# Patient Record
Sex: Female | Born: 1935 | Race: White | Hispanic: No | Marital: Married | Smoking: Former smoker
Health system: Southern US, Community
[De-identification: ages and names within clinical notes are randomized; demographics above are authoritative.]

## PROBLEM LIST (undated history)

## (undated) DIAGNOSIS — G629 Polyneuropathy, unspecified: Secondary | ICD-10-CM

## (undated) DIAGNOSIS — D649 Anemia, unspecified: Secondary | ICD-10-CM

## (undated) DIAGNOSIS — E119 Type 2 diabetes mellitus without complications: Secondary | ICD-10-CM

## (undated) DIAGNOSIS — C801 Malignant (primary) neoplasm, unspecified: Secondary | ICD-10-CM

## (undated) DIAGNOSIS — I10 Essential (primary) hypertension: Secondary | ICD-10-CM

## (undated) DIAGNOSIS — N189 Chronic kidney disease, unspecified: Secondary | ICD-10-CM

## (undated) DIAGNOSIS — E079 Disorder of thyroid, unspecified: Secondary | ICD-10-CM

## (undated) DIAGNOSIS — C50919 Malignant neoplasm of unspecified site of unspecified female breast: Secondary | ICD-10-CM

## (undated) DIAGNOSIS — M1990 Unspecified osteoarthritis, unspecified site: Secondary | ICD-10-CM

## (undated) DIAGNOSIS — I739 Peripheral vascular disease, unspecified: Secondary | ICD-10-CM

## (undated) HISTORY — PX: BREAST LUMPECTOMY: SHX2

## (undated) HISTORY — PX: URETERAL STENT PLACEMENT: SHX822

## (undated) HISTORY — PX: TOTAL HIP ARTHROPLASTY: SHX124

## (undated) HISTORY — PX: CHOLECYSTECTOMY: SHX55

## (undated) HISTORY — DX: Chronic kidney disease, unspecified: N18.9

## (undated) HISTORY — DX: Essential (primary) hypertension: I10

## (undated) HISTORY — DX: Malignant (primary) neoplasm, unspecified: C80.1

## (undated) HISTORY — PX: REPLACEMENT TOTAL KNEE: SUR1224

## (undated) HISTORY — DX: Anemia, unspecified: D64.9

## (undated) HISTORY — DX: Unspecified osteoarthritis, unspecified site: M19.90

## (undated) HISTORY — PX: EYE SURGERY: SHX253

## (undated) HISTORY — DX: Peripheral vascular disease, unspecified: I73.9

## (undated) HISTORY — DX: Disorder of thyroid, unspecified: E07.9

## (undated) HISTORY — DX: Polyneuropathy, unspecified: G62.9

---

## 2010-03-15 ENCOUNTER — Encounter: Payer: Self-pay | Admitting: Family Medicine

## 2010-03-15 ENCOUNTER — Ambulatory Visit (INDEPENDENT_AMBULATORY_CARE_PROVIDER_SITE_OTHER): Payer: Medicare Other | Admitting: Family Medicine

## 2010-03-15 VITALS — BP 168/90 | Temp 97.8°F | Ht 61.5 in | Wt 200.0 lb

## 2010-03-15 DIAGNOSIS — E876 Hypokalemia: Secondary | ICD-10-CM

## 2010-03-15 DIAGNOSIS — E119 Type 2 diabetes mellitus without complications: Secondary | ICD-10-CM

## 2010-03-15 DIAGNOSIS — D649 Anemia, unspecified: Secondary | ICD-10-CM

## 2010-03-15 LAB — CBC WITH DIFFERENTIAL/PLATELET
Basophils Relative: 0.3 % (ref 0.0–3.0)
Eosinophils Relative: 3 % (ref 0.0–5.0)
Hemoglobin: 11.9 g/dL — ABNORMAL LOW (ref 12.0–15.0)
MCHC: 33.4 g/dL (ref 30.0–36.0)
MCV: 77.3 fl — ABNORMAL LOW (ref 78.0–100.0)
Monocytes Absolute: 1 10*3/uL (ref 0.1–1.0)
Neutro Abs: 7 10*3/uL (ref 1.4–7.7)
Neutrophils Relative %: 74.2 % (ref 43.0–77.0)
RBC: 4.62 Mil/uL (ref 3.87–5.11)
WBC: 9.4 10*3/uL (ref 4.5–10.5)

## 2010-03-15 LAB — BASIC METABOLIC PANEL
BUN: 38 mg/dL — ABNORMAL HIGH (ref 6–23)
Chloride: 104 mEq/L (ref 96–112)
Creatinine, Ser: 1.6 mg/dL — ABNORMAL HIGH (ref 0.4–1.2)
Glucose, Bld: 118 mg/dL — ABNORMAL HIGH (ref 70–99)
Potassium: 4.1 mEq/L (ref 3.5–5.1)

## 2010-03-15 LAB — HEMOGLOBIN A1C: Hgb A1c MFr Bld: 6.7 % — ABNORMAL HIGH (ref 4.6–6.5)

## 2010-03-15 NOTE — Progress Notes (Signed)
  Subjective:    Patient ID: Amanda Cunningham, female    DOB: 05-Jun-1919, 77 y.o.   MRN: [id number]  HPI  Amanda Cunningham Is a delightful, 77 year old, married female from [place], [place], who comes in today to get established.  Because she is visiting her daughter Amanda Cunningham in , [place] for the winter.  She states that she likes it here, so much,,,,,,,, today's temperature is 60 and sunny,,,,,,, that she might move to [place]  She said a history of diabetes.  She takes Amaryl 2 mg daily states her blood sugar typically ranges in the [address]140 range.  She checks it to 3 times per week.  She does not recall what her last A1c was .    She has a history of anemia.  She states are not sure where she is bleeding from the details are in Amanda Cunningham office notes in [place].  She is not sure of the details.  Her physician wants her to have a metabolic panel to evaluate her potassium because they recently increased her potassium from 20 mEq a day to 40.  Review of Systems    neg Objective:   Physical Exam    She is a moderately obese female in no acute distress    Assessment & Plan:  Diabetes,,,,,,,,,,, check metabolic panel.  Hypokalemia,,,,,,,, check metabolic panel,,,,  Anemia.  Check CBC

## 2010-03-15 NOTE — Patient Instructions (Signed)
I will call you when I get your lab work back.  Continue current medications.

## 2010-07-23 ENCOUNTER — Emergency Department (HOSPITAL_COMMUNITY): Payer: Medicare Other

## 2010-07-23 ENCOUNTER — Emergency Department (HOSPITAL_COMMUNITY)
Admission: EM | Admit: 2010-07-23 | Discharge: 2010-07-23 | Disposition: A | Discharge status: Home / Self Care | Payer: Medicare Other | Attending: Emergency Medicine | Admitting: Emergency Medicine

## 2010-07-23 DIAGNOSIS — E876 Hypokalemia: Secondary | ICD-10-CM | POA: Insufficient documentation

## 2010-07-23 DIAGNOSIS — M549 Dorsalgia, unspecified: Secondary | ICD-10-CM | POA: Insufficient documentation

## 2010-07-23 DIAGNOSIS — R0602 Shortness of breath: Secondary | ICD-10-CM | POA: Insufficient documentation

## 2010-07-23 DIAGNOSIS — R079 Chest pain, unspecified: Secondary | ICD-10-CM | POA: Insufficient documentation

## 2010-07-23 DIAGNOSIS — I10 Essential (primary) hypertension: Secondary | ICD-10-CM | POA: Insufficient documentation

## 2010-07-23 DIAGNOSIS — Z7982 Long term (current) use of aspirin: Secondary | ICD-10-CM | POA: Insufficient documentation

## 2010-07-23 DIAGNOSIS — R0609 Other forms of dyspnea: Secondary | ICD-10-CM | POA: Insufficient documentation

## 2010-07-23 DIAGNOSIS — M25519 Pain in unspecified shoulder: Secondary | ICD-10-CM | POA: Insufficient documentation

## 2010-07-23 DIAGNOSIS — Z79899 Other long term (current) drug therapy: Secondary | ICD-10-CM | POA: Insufficient documentation

## 2010-07-23 DIAGNOSIS — M546 Pain in thoracic spine: Secondary | ICD-10-CM | POA: Insufficient documentation

## 2010-07-23 DIAGNOSIS — E119 Type 2 diabetes mellitus without complications: Secondary | ICD-10-CM | POA: Insufficient documentation

## 2010-07-23 DIAGNOSIS — I517 Cardiomegaly: Secondary | ICD-10-CM | POA: Insufficient documentation

## 2010-07-23 DIAGNOSIS — R0989 Other specified symptoms and signs involving the circulatory and respiratory systems: Secondary | ICD-10-CM | POA: Insufficient documentation

## 2010-07-23 LAB — DIFFERENTIAL
Basophils Absolute: 0 10*3/uL (ref 0.0–0.1)
Basophils Relative: 0 % (ref 0–1)
Eosinophils Absolute: 0.3 10*3/uL (ref 0.0–0.7)
Monocytes Relative: 6 % (ref 3–12)
Neutrophils Relative %: 78 % — ABNORMAL HIGH (ref 43–77)

## 2010-07-23 LAB — GLUCOSE, CAPILLARY: Glucose-Capillary: 125 mg/dL — ABNORMAL HIGH (ref 70–99)

## 2010-07-23 LAB — CBC
MCH: 25.1 pg — ABNORMAL LOW (ref 26.0–34.0)
MCHC: 32 g/dL (ref 30.0–36.0)
Platelets: 279 10*3/uL (ref 150–400)
RBC: 4.3 MIL/uL (ref 3.87–5.11)

## 2010-07-23 LAB — BASIC METABOLIC PANEL
CO2: 27 mEq/L (ref 19–32)
Calcium: 8.3 mg/dL — ABNORMAL LOW (ref 8.4–10.5)
GFR calc non Af Amer: 42 mL/min — ABNORMAL LOW (ref >60)
Potassium: 2.8 mEq/L — ABNORMAL LOW (ref 3.5–5.1)
Sodium: 140 mEq/L (ref 135–145)

## 2010-07-23 MED ORDER — TECHNETIUM TO 99M ALBUMIN AGGREGATED
6.0000 | Freq: Once | INTRAVENOUS | Status: AC | PRN
  Administered 2010-07-23: 6 via INTRAVENOUS

## 2010-07-23 MED ORDER — XENON XE 133 GAS
10.0000 | GAS_FOR_INHALATION | Freq: Once | RESPIRATORY_TRACT | Status: AC | PRN
  Administered 2010-07-23: 10 via RESPIRATORY_TRACT

## 2011-05-12 ENCOUNTER — Other Ambulatory Visit: Payer: Self-pay

## 2011-05-12 ENCOUNTER — Emergency Department (HOSPITAL_COMMUNITY): Payer: Medicare Other

## 2011-05-12 ENCOUNTER — Encounter (HOSPITAL_COMMUNITY): Payer: Self-pay | Admitting: Emergency Medicine

## 2011-05-12 ENCOUNTER — Inpatient Hospital Stay (HOSPITAL_COMMUNITY)
Admission: EM | Admit: 2011-05-12 | Discharge: 2011-05-18 | DRG: 389 | Disposition: A | Discharge status: Home / Self Care | Payer: Medicare Other | Attending: Internal Medicine | Admitting: Internal Medicine

## 2011-05-12 DIAGNOSIS — A088 Other specified intestinal infections: Secondary | ICD-10-CM | POA: Diagnosis not present

## 2011-05-12 DIAGNOSIS — N182 Chronic kidney disease, stage 2 (mild): Secondary | ICD-10-CM | POA: Diagnosis present

## 2011-05-12 DIAGNOSIS — I10 Essential (primary) hypertension: Secondary | ICD-10-CM | POA: Diagnosis not present

## 2011-05-12 DIAGNOSIS — N179 Acute kidney failure, unspecified: Secondary | ICD-10-CM | POA: Diagnosis not present

## 2011-05-12 DIAGNOSIS — I129 Hypertensive chronic kidney disease with stage 1 through stage 4 chronic kidney disease, or unspecified chronic kidney disease: Secondary | ICD-10-CM | POA: Diagnosis present

## 2011-05-12 DIAGNOSIS — R112 Nausea with vomiting, unspecified: Secondary | ICD-10-CM | POA: Diagnosis not present

## 2011-05-12 DIAGNOSIS — R0989 Other specified symptoms and signs involving the circulatory and respiratory systems: Secondary | ICD-10-CM | POA: Diagnosis present

## 2011-05-12 DIAGNOSIS — E032 Hypothyroidism due to medicaments and other exogenous substances: Secondary | ICD-10-CM | POA: Diagnosis not present

## 2011-05-12 DIAGNOSIS — E039 Hypothyroidism, unspecified: Secondary | ICD-10-CM | POA: Diagnosis present

## 2011-05-12 DIAGNOSIS — R109 Unspecified abdominal pain: Secondary | ICD-10-CM | POA: Diagnosis not present

## 2011-05-12 DIAGNOSIS — R1084 Generalized abdominal pain: Secondary | ICD-10-CM | POA: Diagnosis not present

## 2011-05-12 DIAGNOSIS — G629 Polyneuropathy, unspecified: Secondary | ICD-10-CM | POA: Insufficient documentation

## 2011-05-12 DIAGNOSIS — E876 Hypokalemia: Secondary | ICD-10-CM | POA: Diagnosis not present

## 2011-05-12 DIAGNOSIS — K560 Paralytic ileus: Principal | ICD-10-CM | POA: Diagnosis present

## 2011-05-12 DIAGNOSIS — K56609 Unspecified intestinal obstruction, unspecified as to partial versus complete obstruction: Secondary | ICD-10-CM | POA: Diagnosis present

## 2011-05-12 DIAGNOSIS — D72829 Elevated white blood cell count, unspecified: Secondary | ICD-10-CM | POA: Diagnosis present

## 2011-05-12 DIAGNOSIS — R110 Nausea: Secondary | ICD-10-CM | POA: Diagnosis not present

## 2011-05-12 DIAGNOSIS — Z853 Personal history of malignant neoplasm of breast: Secondary | ICD-10-CM | POA: Diagnosis not present

## 2011-05-12 DIAGNOSIS — D649 Anemia, unspecified: Secondary | ICD-10-CM | POA: Diagnosis present

## 2011-05-12 DIAGNOSIS — H409 Unspecified glaucoma: Secondary | ICD-10-CM | POA: Diagnosis present

## 2011-05-12 DIAGNOSIS — E860 Dehydration: Secondary | ICD-10-CM | POA: Diagnosis present

## 2011-05-12 DIAGNOSIS — C50512 Malignant neoplasm of lower-outer quadrant of left female breast: Secondary | ICD-10-CM | POA: Insufficient documentation

## 2011-05-12 DIAGNOSIS — E119 Type 2 diabetes mellitus without complications: Secondary | ICD-10-CM | POA: Diagnosis present

## 2011-05-12 DIAGNOSIS — K5900 Constipation, unspecified: Secondary | ICD-10-CM | POA: Diagnosis not present

## 2011-05-12 DIAGNOSIS — E079 Disorder of thyroid, unspecified: Secondary | ICD-10-CM | POA: Diagnosis present

## 2011-05-12 LAB — LIPASE, BLOOD: Lipase: 59 U/L (ref 11–59)

## 2011-05-12 LAB — DIFFERENTIAL
Basophils Absolute: 0 10*3/uL (ref 0.0–0.1)
Basophils Relative: 0 % (ref 0–1)
Eosinophils Relative: 0 % (ref 0–5)
Monocytes Absolute: 0.6 10*3/uL (ref 0.1–1.0)
Neutro Abs: 9.9 10*3/uL — ABNORMAL HIGH (ref 1.7–7.7)

## 2011-05-12 LAB — URINALYSIS, ROUTINE W REFLEX MICROSCOPIC
Glucose, UA: NEGATIVE mg/dL
Hgb urine dipstick: NEGATIVE
Specific Gravity, Urine: 1.017 (ref 1.005–1.030)
pH: 6 (ref 5.0–8.0)

## 2011-05-12 LAB — CBC
HCT: 40.8 % (ref 36.0–46.0)
MCHC: 31.1 g/dL (ref 30.0–36.0)
Platelets: 423 10*3/uL — ABNORMAL HIGH (ref 150–400)
RDW: 15.9 % — ABNORMAL HIGH (ref 11.5–15.5)

## 2011-05-12 LAB — URINE MICROSCOPIC-ADD ON

## 2011-05-12 LAB — COMPREHENSIVE METABOLIC PANEL
AST: 29 U/L (ref 0–37)
Albumin: 3.5 g/dL (ref 3.5–5.2)
Calcium: 9.5 mg/dL (ref 8.4–10.5)
Chloride: 102 mEq/L (ref 96–112)
Creatinine, Ser: 1.24 mg/dL — ABNORMAL HIGH (ref 0.50–1.10)

## 2011-05-12 MED ORDER — ONDANSETRON HCL 4 MG PO TABS: 4.0000 mg | ORAL_TABLET | Freq: Four times a day (QID) | ORAL | Status: DC | PRN

## 2011-05-12 MED ORDER — CLONIDINE HCL 0.2 MG/24HR TD PTWK
0.2000 mg | MEDICATED_PATCH | TRANSDERMAL | Status: DC
  Administered 2011-05-12 – 2011-05-16 (×2): 0.2 mg via TRANSDERMAL
  Filled 2011-05-12 (×2): qty 1

## 2011-05-12 MED ORDER — HYDROMORPHONE HCL PF 1 MG/ML IJ SOLN
1.0000 mg | Freq: Once | INTRAMUSCULAR | Status: AC
  Administered 2011-05-12: 1 mg via INTRAVENOUS
  Filled 2011-05-12: qty 1

## 2011-05-12 MED ORDER — MORPHINE SULFATE 2 MG/ML IJ SOLN
1.0000 mg | INTRAMUSCULAR | Status: DC | PRN
  Administered 2011-05-13 – 2011-05-14 (×5): 1 mg via INTRAVENOUS
  Filled 2011-05-12 (×5): qty 1

## 2011-05-12 MED ORDER — ONDANSETRON HCL 4 MG/2ML IJ SOLN
4.0000 mg | Freq: Once | INTRAMUSCULAR | Status: AC
  Administered 2011-05-12: 4 mg via INTRAVENOUS
  Filled 2011-05-12: qty 2

## 2011-05-12 MED ORDER — CHLORHEXIDINE GLUCONATE 0.12 % MT SOLN
15.0000 mL | Freq: Two times a day (BID) | OROMUCOSAL | Status: DC
  Administered 2011-05-13 – 2011-05-18 (×9): 15 mL via OROMUCOSAL
  Filled 2011-05-12 (×13): qty 15

## 2011-05-12 MED ORDER — HYDRALAZINE HCL 20 MG/ML IJ SOLN
10.0000 mg | Freq: Four times a day (QID) | INTRAMUSCULAR | Status: DC
  Administered 2011-05-12 – 2011-05-15 (×9): 10 mg via INTRAVENOUS
  Filled 2011-05-12 (×2): qty 0.5
  Filled 2011-05-12: qty 1
  Filled 2011-05-12 (×11): qty 0.5

## 2011-05-12 MED ORDER — DORZOLAMIDE HCL-TIMOLOL MAL 2-0.5 % OP SOLN
1.0000 [drp] | Freq: Every day | OPHTHALMIC | Status: DC
  Administered 2011-05-12 – 2011-05-17 (×6): 1 [drp] via OPHTHALMIC
  Filled 2011-05-12: qty 10

## 2011-05-12 MED ORDER — PANTOPRAZOLE SODIUM 40 MG IV SOLR
40.0000 mg | Freq: Every day | INTRAVENOUS | Status: DC
  Administered 2011-05-12 – 2011-05-14 (×3): 40 mg via INTRAVENOUS
  Filled 2011-05-12 (×5): qty 40

## 2011-05-12 MED ORDER — ACETAMINOPHEN 650 MG RE SUPP: 650.0000 mg | Freq: Four times a day (QID) | RECTAL | Status: DC | PRN

## 2011-05-12 MED ORDER — BIOTENE DRY MOUTH MT LIQD
15.0000 mL | Freq: Two times a day (BID) | OROMUCOSAL | Status: DC
  Administered 2011-05-13 – 2011-05-17 (×8): 15 mL via OROMUCOSAL

## 2011-05-12 MED ORDER — ACETAMINOPHEN 325 MG PO TABS
650.0000 mg | ORAL_TABLET | Freq: Four times a day (QID) | ORAL | Status: DC | PRN
  Administered 2011-05-13 – 2011-05-17 (×2): 650 mg via ORAL
  Filled 2011-05-12 (×2): qty 2

## 2011-05-12 MED ORDER — ENOXAPARIN SODIUM 40 MG/0.4ML ~~LOC~~ SOLN
40.0000 mg | SUBCUTANEOUS | Status: DC
  Administered 2011-05-12: 40 mg via SUBCUTANEOUS
  Filled 2011-05-12 (×3): qty 0.4

## 2011-05-12 MED ORDER — ONDANSETRON HCL 4 MG/2ML IJ SOLN
4.0000 mg | Freq: Four times a day (QID) | INTRAMUSCULAR | Status: DC | PRN
  Administered 2011-05-13 – 2011-05-14 (×2): 4 mg via INTRAVENOUS
  Filled 2011-05-12 (×2): qty 2

## 2011-05-12 MED ORDER — SODIUM CHLORIDE 0.9 % IV SOLN
1000.0000 mL | INTRAVENOUS | Status: DC
  Administered 2011-05-12 (×2): 1000 mL via INTRAVENOUS

## 2011-05-12 MED ORDER — LABETALOL HCL 5 MG/ML IV SOLN
5.0000 mg | Freq: Three times a day (TID) | INTRAVENOUS | Status: DC | PRN
  Administered 2011-05-13 – 2011-05-14 (×2): 5 mg via INTRAVENOUS
  Filled 2011-05-12 (×3): qty 4

## 2011-05-12 MED ORDER — LEVOTHYROXINE SODIUM 100 MCG IV SOLR
75.0000 ug | Freq: Every day | INTRAVENOUS | Status: DC
  Administered 2011-05-13 – 2011-05-14 (×2): 76 ug via INTRAVENOUS
  Filled 2011-05-12 (×4): qty 3.8

## 2011-05-12 MED ORDER — TRAVOPROST 0.004 % OP SOLN
1.0000 [drp] | Freq: Every day | OPHTHALMIC | Status: DC
  Administered 2011-05-12 – 2011-05-17 (×6): 1 [drp] via OPHTHALMIC
  Filled 2011-05-12 (×4): qty 0.1

## 2011-05-12 MED ORDER — POTASSIUM CHLORIDE IN NACL 20-0.9 MEQ/L-% IV SOLN
INTRAVENOUS | Status: DC
  Administered 2011-05-12 – 2011-05-13 (×2): via INTRAVENOUS
  Filled 2011-05-12 (×5): qty 1000

## 2011-05-12 NOTE — ED Notes (Signed)
[id number]<AV> Expected date:<BR> Expected time: 1:16 PM<BR> Means of arrival:Ambulance<BR> Comments:<BR> M31 -- Abdominal Pain

## 2011-05-12 NOTE — ED Notes (Signed)
Pt reports abd pain and emesis since last night with emesis x 1 today at 1030am. Pt reports hx adhesions to abd from choley 35 years ago and "have trouble with abd pain due to that".

## 2011-05-12 NOTE — ED Notes (Signed)
Patient transported to X-ray 

## 2011-05-12 NOTE — H&P (Signed)
PCP:   Evette Georges, MD, MD   Chief Complaint:  Abdominal pain, N/V; dehydration.  HPI: 78 y/o female with PMH of HTN, DM, hypothyroidism, CKD (stage 2), hx of breast cancer; hx of  Cholecystectomy and previous SBO due to adhesions in the past; who came to the hospital complaining of abdominal pain, nausea and vomiting for the last 48 hours. Patient reports symptoms similar to previous SBO episodes and that since she was having difficulty keeping things down and pain becoming less tolerable she decide to come to ED for further evaluation and treatment.  In the ED patient's work up demonstrated abdominal x-ray with concerns for SBO; elevated creatinine at 1.24, hypokalemia and dehydration.   Patient denies CP, SOB, fever, dysuria, chills, hematemesis or melena.  Allergies:   Allergies  Allergen Reactions  . Iodine Hives and Shortness Of Breath  . Tape     Plastic tape      Past Medical History  Diagnosis Date  . Anemia   . Glaucoma   . Diabetes mellitus   . Hypertension   . Thyroid disease   . Arthritis   . Cancer     breast  . Chronic kidney disease   . PAD (peripheral artery disease)   . Neuropathy     Past Surgical History  Procedure Date  . Total hip arthroplasty     left  . Replacement total knee     right  . Eye surgery   . Cholecystectomy   . Breast lumpectomy     left  . Ureteral stent placement     Prior to Admission medications   Medication Sig Start Date End Date Taking? Authorizing Provider  amLODipine (NORVASC) 10 MG tablet Take 10 mg by mouth 2 (two) times daily.     Yes Historical Provider, MD  aspirin 81 MG tablet Take 81 mg by mouth daily.     Yes Historical Provider, MD  cloNIDine (CATAPRES) 0.2 MG tablet Take 0.2 mg by mouth 2 (two) times daily.     Yes Historical Provider, MD  clopidogrel (PLAVIX) 75 MG tablet Take 75 mg by mouth daily.     Yes Historical Provider, MD  dorzolamide-timolol (COSOPT) 22.3-6.8 MG/ML ophthalmic solution  Place 1 drop into both eyes at bedtime.   Yes Historical Provider, MD  ferrous sulfate 325 (65 FE) MG tablet Take 325 mg by mouth daily.    Yes Historical Provider, MD  glimepiride (AMARYL) 2 MG tablet Take 2 mg by mouth. One or half tab daily   Yes Historical Provider, MD  glucose blood test strip 1 each by Other route as needed. Use as instructed    Yes Historical Provider, MD  hydrALAZINE (APRESOLINE) 50 MG tablet Take 50 mg by mouth 2 (two) times daily.     Yes Historical Provider, MD  hydrochlorothiazide 25 MG tablet Take 25 mg by mouth daily.     Yes Historical Provider, MD  Lancets [facility] ULTRASOFT) lancets 1 each by Other route as needed. Use as instructed    Yes Historical Provider, MD  levothyroxine (SYNTHROID, LEVOTHROID) 150 MCG tablet Take 150 mcg by mouth daily.   Yes Historical Provider, MD  metoprolol (TOPROL-XL) 100 MG 24 hr tablet Take 100 mg by mouth daily.     Yes Historical Provider, MD  Multiple Vitamin (MULTIVITAMIN) tablet Take 1 tablet by mouth daily.     Yes Historical Provider, MD  Multiple Vitamins-Minerals (PRESERVISION/LUTEIN) CAPS Take 1 capsule by mouth 2 (two) times daily.  Yes Historical Provider, MD  pantoprazole (PROTONIX) 40 MG tablet Take 40 mg by mouth daily.     Yes Historical Provider, MD  potassium chloride SA (K-DUR,KLOR-CON) 20 MEQ tablet Take 40 mEq by mouth daily.   Yes Historical Provider, MD  travoprost, benzalkonium, (TRAVATAN) 0.004 % ophthalmic solution Place 1 drop into both eyes at bedtime.    Yes Historical Provider, MD    Social History:  reports that she has never smoked. She does not have any smokeless tobacco history on file. She reports that she does not drink alcohol or use illicit drugs.  Family History  Problem Relation Age of Onset  . Heart disease Mother   . Coronary artery disease Father   . Cancer Brother     prostate cancer    Review of Systems:  Negative except as mentioned on HPI.  Physical Exam: Blood pressure  160/54, pulse 68, temperature 98 F (36.7 C), temperature source Oral, resp. rate 16, SpO2 99.00%. Constitutional: She appears well-developed; dehydrated and in mild distress.  HENT:  Head: Normocephalic and atraumatic.  Ears: w/o acute discharges or bulging membranes.  Eyes: Conjunctivae are normal. Right eye exhibits no discharge. Left eye exhibits no discharge. No scleral icterus. No nystagmus. Neck: Neck supple. No tracheal deviation present.  Cardiovascular: Normal rate, regular rhythm and intact distal pulses.  Pulmonary/Chest: Effort normal and breath sounds normal. No stridor. No respiratory distress. She has no wheezes. She has no rales.  Abdominal: Soft. She exhibits no distension, no ascites and no pulsatile midline mass. Bowel sounds are decreased; tenderness to palpation in epigastric/RUQ area. There is no rigidity.  Musculoskeletal: She exhibits no edema and no tenderness.  Neurological: She is alert. She has normal strength. No sensory deficit. Cranial nerve intact. She exhibits normal muscle tone. She displays no seizure activity. Coordination normal.   Labs on Admission:  Results for orders placed during the hospital encounter of 05/12/11 (from the past 48 hour(s))  URINALYSIS, ROUTINE W REFLEX MICROSCOPIC     Status: Abnormal   Collection Time   05/12/11  3:46 PM      Component Value Range Comment   Color, Urine YELLOW  YELLOW     APPearance CLEAR  CLEAR     Specific Gravity, Urine 1.017  1.005 - 1.030     pH 6.0  5.0 - 8.0     Glucose, UA NEGATIVE  NEGATIVE (mg/dL)    Hgb urine dipstick NEGATIVE  NEGATIVE     Bilirubin Urine NEGATIVE  NEGATIVE     Ketones, ur NEGATIVE  NEGATIVE (mg/dL)    Protein, ur 161 (*) NEGATIVE (mg/dL)    Urobilinogen, UA 0.2  0.0 - 1.0 (mg/dL)    Nitrite NEGATIVE  NEGATIVE     Leukocytes, UA TRACE (*) NEGATIVE    URINE MICROSCOPIC-ADD ON     Status: Abnormal   Collection Time   05/12/11  3:46 PM      Component Value Range Comment   Squamous  Epithelial / LPF FEW (*) RARE     WBC, UA 0-2  <3 (WBC/hpf)    Bacteria, UA FEW (*) RARE     Urine-Other MUCOUS PRESENT     CBC     Status: Abnormal   Collection Time   05/12/11  3:59 PM      Component Value Range Comment   WBC 11.5 (*) 4.0 - 10.5 (K/uL)    RBC 5.06  3.87 - 5.11 (MIL/uL)    Hemoglobin 12.7  12.0 -  15.0 (g/dL)    HCT 16.1  09.6 - 04.5 (%)    MCV 80.6  78.0 - 100.0 (fL)    MCH 25.1 (*) 26.0 - 34.0 (pg)    MCHC 31.1  30.0 - 36.0 (g/dL)    RDW 40.9 (*) 81.1 - 15.5 (%)    Platelets 423 (*) 150 - 400 (K/uL)   DIFFERENTIAL     Status: Abnormal   Collection Time   05/12/11  3:59 PM      Component Value Range Comment   Neutrophils Relative 86 (*) 43 - 77 (%)    Neutro Abs 9.9 (*) 1.7 - 7.7 (K/uL)    Lymphocytes Relative 8 (*) 12 - 46 (%)    Lymphs Abs 0.9  0.7 - 4.0 (K/uL)    Monocytes Relative 5  3 - 12 (%)    Monocytes Absolute 0.6  0.1 - 1.0 (K/uL)    Eosinophils Relative 0  0 - 5 (%)    Eosinophils Absolute 0.0  0.0 - 0.7 (K/uL)    Basophils Relative 0  0 - 1 (%)    Basophils Absolute 0.0  0.0 - 0.1 (K/uL)   COMPREHENSIVE METABOLIC PANEL     Status: Abnormal   Collection Time   05/12/11  3:59 PM      Component Value Range Comment   Sodium 145  135 - 145 (mEq/L)    Potassium 3.2 (*) 3.5 - 5.1 (mEq/L) SLIGHT HEMOLYSIS   Chloride 102  96 - 112 (mEq/L)    CO2 30  19 - 32 (mEq/L)    Glucose, Bld 138 (*) 70 - 99 (mg/dL)    BUN 26 (*) 6 - 23 (mg/dL)    Creatinine, Ser 9.14 (*) 0.50 - 1.10 (mg/dL)    Calcium 9.5  8.4 - 10.5 (mg/dL)    Total Protein 7.7  6.0 - 8.3 (g/dL)    Albumin 3.5  3.5 - 5.2 (g/dL)    AST 29  0 - 37 (U/L) SLIGHT HEMOLYSIS   ALT 13  0 - 35 (U/L)    Alkaline Phosphatase 112  39 - 117 (U/L)    Total Bilirubin 0.3  0.3 - 1.2 (mg/dL)    GFR calc non Af Amer 37 (*) >90 (mL/min)    GFR calc Af Amer 43 (*) >90 (mL/min)   LIPASE, BLOOD     Status: Normal   Collection Time   05/12/11  3:59 PM      Component Value Range Comment   Lipase 59  11 - 59 (U/L)       Radiological Exams on Admission: No results found.  Assessment/Plan 1-SBO (small bowel obstruction): with active N/V and abdominal pain; will place NGT, bowel rest with NPO status; provide supportive care using antiemetics, PRN pain medications and electrolytes repletion. Hx of abdominal surgery and previous SBO in the past associated with adhesions. Will try conservative management if symptoms failed to improve will contact surgery.  2-DM (diabetes mellitus):will check a1C; CBG q4h while NPO and PRN insulin if needed.  3-Anemia: chronic; will follow hgb trend; no need for transfusion. Will hold ferrous sulfate while NPO.  4-Thyroid disease: check TSH; will continue synthroid through IV, will use 1/2 of regular dose.  5-Abdominal pain: associated with number 1; will use PRN meds and place NGT.  6-Nausea & vomiting: associated with SBO; will place NGT and use PRN antiemetics.  7-Hypokalemia: will replete.  8-acute on chronic renal failure (stage 2): pre-renal in nature; will hydrate; stop diuretics  and follow kidney function trend.  9-Dehydration: will provide IVF's.  10-Leukocytosis: 2/2 demargination; will hold on antibiotics and will follow WBC's trend.  11-DVT: lovenox  12-GERD/GI prophylaxis: will use PPI.   Time Spent on Admission: 50 minutes  [place] [phone]  05/12/2011, 6:33 PM

## 2011-05-12 NOTE — ED Provider Notes (Signed)
History     CSN: [id number]  Arrival date & time 05/12/11  1330   First MD Initiated Contact with Patient 05/12/11 [phone]      Chief Complaint  Patient presents with  . Abdominal Pain   HPI Comments: Pt vomited three times since last night.  She has history of prior bowel obstructions and adhesions associated with abdominal pain.  Pt last had an episode like this about 8 months ago.  She has had surgery in the past to treat the adhesions but was told there were numerous that the could not treat.  Patient is a 78 y.o. female presenting with abdominal pain. The history is provided by the patient.  Abdominal Pain The primary symptoms of the illness include abdominal pain. The primary symptoms of the illness do not include shortness of breath or dysuria. The current episode started yesterday. The onset of the illness was sudden. The problem has been gradually worsening.  Progression: it does wax and wane. The abdominal pain is generalized. The abdominal pain does not radiate. The severity of the abdominal pain is 10/10. The abdominal pain is relieved by nothing. Exacerbated by: movement and palpation.  Additional symptoms associated with the illness include anorexia.    Past Medical History  Diagnosis Date  . Anemia   . Glaucoma   . Diabetes mellitus   . Hypertension   . Thyroid disease   . Arthritis   . Cancer     breast  . Chronic kidney disease   . PAD (peripheral artery disease)   . Neuropathy     Past Surgical History  Procedure Date  . Total hip arthroplasty     left  . Replacement total knee     right  . Eye surgery   . Cholecystectomy   . Breast lumpectomy     left  . Ureteral stent placement     Family History  Problem Relation Age of Onset  . Heart disease Mother   . Coronary artery disease Father   . Cancer Brother     prostate cancer    History  Substance Use Topics  . Smoking status: Never Smoker   . Smokeless tobacco: Not on file  . Alcohol Use: No      OB History    Grav Para Term Preterm Abortions TAB SAB Ect Mult Living                  Review of Systems  Respiratory: Negative for shortness of breath.   Cardiovascular: Negative for chest pain.  Gastrointestinal: Positive for abdominal pain and anorexia.  Genitourinary: Negative for dysuria.  All other systems reviewed and are negative.    Allergies  Iodine  Home Medications   Current Outpatient Rx  Name Route Sig Dispense Refill  . AMLODIPINE BESYLATE 10 MG PO TABS Oral Take 10 mg by mouth 2 (two) times daily.      . ASPIRIN 81 MG PO TABS Oral Take 81 mg by mouth daily.      Marland Kitchen CLONIDINE HCL 0.2 MG PO TABS Oral Take 0.2 mg by mouth 2 (two) times daily.      Marland Kitchen CLOPIDOGREL BISULFATE 75 MG PO TABS Oral Take 75 mg by mouth daily.      . DORZOLAMIDE HCL-TIMOLOL MAL 22.3-6.8 MG/ML OP SOLN Both Eyes Place 1 drop into both eyes at bedtime.    Marland Kitchen FERROUS SULFATE 325 (65 FE) MG PO TABS Oral Take 325 mg by mouth daily.     Marland Kitchen  GLIMEPIRIDE 2 MG PO TABS Oral Take 2 mg by mouth. One or half tab daily    . GLUCOSE BLOOD VI STRP Other 1 each by Other route as needed. Use as instructed     . HYDRALAZINE HCL 50 MG PO TABS Oral Take 50 mg by mouth 2 (two) times daily.      Marland Kitchen HYDROCHLOROTHIAZIDE 25 MG PO TABS Oral Take 25 mg by mouth daily.      Letta Pate ULTRASOFT LANCETS MISC Other 1 each by Other route as needed. Use as instructed     . LEVOTHYROXINE SODIUM 150 MCG PO TABS Oral Take 150 mcg by mouth daily.    Marland Kitchen METOPROLOL SUCCINATE ER 100 MG PO TB24 Oral Take 100 mg by mouth daily.      Marland Kitchen ONE-DAILY MULTI VITAMINS PO TABS Oral Take 1 tablet by mouth daily.      Marland Kitchen PRESERVISION/LUTEIN PO CAPS Oral Take 1 capsule by mouth 2 (two) times daily.     Marland Kitchen PANTOPRAZOLE SODIUM 40 MG PO TBEC Oral Take 40 mg by mouth daily.      Marland Kitchen POTASSIUM CHLORIDE CRYS ER 20 MEQ PO TBCR Oral Take 40 mEq by mouth daily.    . TRAVOPROST 0.004 % OP SOLN Both Eyes Place 1 drop into both eyes at bedtime.       BP  200/50  Pulse 77  Temp(Src) 98 F (36.7 C) (Oral)  Resp 18  SpO2 96%  Physical Exam  Nursing note and vitals reviewed. Constitutional: She appears well-developed and well-nourished.  Non-toxic appearance. She appears ill.  HENT:  Head: Normocephalic and atraumatic.  Right Ear: External ear normal.  Left Ear: External ear normal.  Eyes: Conjunctivae are normal. Right eye exhibits no discharge. Left eye exhibits no discharge. No scleral icterus.  Neck: Neck supple. No tracheal deviation present.  Cardiovascular: Normal rate, regular rhythm and intact distal pulses.   Pulmonary/Chest: Effort normal and breath sounds normal. No stridor. No respiratory distress. She has no wheezes. She has no rales.  Abdominal: Soft. She exhibits no distension, no ascites and no pulsatile midline mass. Bowel sounds are decreased. There is tenderness in the epigastric area. There is no rigidity, no rebound and no guarding. No hernia.  Musculoskeletal: She exhibits no edema and no tenderness.  Neurological: She is alert. She has normal strength. No sensory deficit. Cranial nerve deficit:  no gross defecits noted. She exhibits normal muscle tone. She displays no seizure activity. Coordination normal.  Skin: Skin is warm and dry. No rash noted.  Psychiatric: She has a normal mood and affect.    ED Course  Procedures (including critical care time)  Labs Reviewed  CBC - Abnormal; Notable for the following:    WBC 11.5 (*)    MCH 25.1 (*)    RDW 15.9 (*)    Platelets 423 (*)    All other components within normal limits  DIFFERENTIAL - Abnormal; Notable for the following:    Neutrophils Relative 86 (*)    Neutro Abs 9.9 (*)    Lymphocytes Relative 8 (*)    All other components within normal limits  COMPREHENSIVE METABOLIC PANEL - Abnormal; Notable for the following:    Potassium 3.2 (*) SLIGHT HEMOLYSIS   Glucose, Bld 138 (*)    BUN 26 (*)    Creatinine, Ser 1.24 (*)    GFR calc non Af Amer 37 (*)     GFR calc Af Amer 43 (*)    All other  components within normal limits  URINALYSIS, ROUTINE W REFLEX MICROSCOPIC - Abnormal; Notable for the following:    Protein, ur 100 (*)    Leukocytes, UA TRACE (*)    All other components within normal limits  URINE MICROSCOPIC-ADD ON - Abnormal; Notable for the following:    Squamous Epithelial / LPF FEW (*)    Bacteria, UA FEW (*)    All other components within normal limits  LIPASE, BLOOD   Dg Abd Acute W/chest  05/12/2011  *RADIOLOGY REPORT*  Clinical Data:  Abdominal pain, nausea, vomiting, diabetes, hypertension, weakness  ACUTE ABDOMEN SERIES (ABDOMEN 2 VIEW & CHEST 1 VIEW)  Comparison: None  Findings: Enlargement of cardiac silhouette. Calcified tortuous aorta. Pulmonary vascular congestion. Elevation of right diaphragm. No gross infiltrate or effusion. Surgical clips left axilla. Bones diffusely demineralized. Right hip prosthesis.  Large lobulated radiopacity in pelvis 10.0 x 8.8 cm likely a large densely calcified uterine leiomyoma. This is not felt to represent retained rectal GI contrast. Dilated small bowel loops out of proportion to the colonic gas, stool raising question of small bowel obstruction. No definite bowel wall thickening or free intraperitoneal air. Scattered air-fluid levels on left lateral decubitus view. Surgical clips right upper quadrant question cholecystectomy. Scattered vascular calcifications with bilateral renal artery stents.  IMPRESSION: Enlargement of cardiac silhouette with pulmonary vascular congestion. Question small bowel obstruction. Probable large calcified uterine leiomyoma.  Original Report Authenticated By: Lollie Marrow, M.D.    MDM  The patient appears to have a recurrent small bowel obstruction. She is improved with IV antibiotics and pain medications. I have ordered a nasogastric tube to be placed. At this time there does not appear to be any acute surgical emergency. I have consulted the medical service for  admission and observation. I discussed the findings patient and her family and they agree with this plan.        Celene Kras, MD 05/12/11 [phone]

## 2011-05-12 NOTE — ED Notes (Signed)
MD at bedside. 

## 2011-05-13 LAB — BASIC METABOLIC PANEL
BUN: 22 mg/dL (ref 6–23)
CO2: 28 mEq/L (ref 19–32)
Calcium: 8.4 mg/dL (ref 8.4–10.5)
Chloride: 108 mEq/L (ref 96–112)
Creatinine, Ser: 1.19 mg/dL — ABNORMAL HIGH (ref 0.50–1.10)
Glucose, Bld: 103 mg/dL — ABNORMAL HIGH (ref 70–99)

## 2011-05-13 LAB — TSH: TSH: 1.419 u[IU]/mL (ref 0.350–4.500)

## 2011-05-13 LAB — CBC
HCT: 35.2 % — ABNORMAL LOW (ref 36.0–46.0)
MCH: 25 pg — ABNORMAL LOW (ref 26.0–34.0)
MCV: 80.7 fL (ref 78.0–100.0)
Platelets: 296 10*3/uL (ref 150–400)
RDW: 15.9 % — ABNORMAL HIGH (ref 11.5–15.5)

## 2011-05-13 LAB — GLUCOSE, CAPILLARY

## 2011-05-13 MED ORDER — INSULIN ASPART 100 UNIT/ML ~~LOC~~ SOLN
0.0000 [IU] | Freq: Three times a day (TID) | SUBCUTANEOUS | Status: DC
  Administered 2011-05-14: 1 [IU] via SUBCUTANEOUS
  Administered 2011-05-14: 2 [IU] via SUBCUTANEOUS
  Administered 2011-05-14 – 2011-05-17 (×4): 1 [IU] via SUBCUTANEOUS

## 2011-05-13 MED ORDER — POTASSIUM CHLORIDE CRYS ER 20 MEQ PO TBCR
40.0000 meq | EXTENDED_RELEASE_TABLET | ORAL | Status: AC
  Administered 2011-05-13 (×2): 40 meq via ORAL
  Filled 2011-05-13 (×3): qty 2

## 2011-05-13 MED ORDER — ENOXAPARIN SODIUM 40 MG/0.4ML ~~LOC~~ SOLN
40.0000 mg | SUBCUTANEOUS | Status: DC
  Administered 2011-05-13 – 2011-05-15 (×3): 40 mg via SUBCUTANEOUS
  Filled 2011-05-13 (×4): qty 0.4

## 2011-05-13 NOTE — Progress Notes (Signed)
Subjective: Patient feeling better; no CP, no SOB; denies N/V and endorses just mild abdominal discomfort. No BM but passing gas.  Objective: Vital signs in last 24 hours: Temp:  [98 F (36.7 C)-98.7 F (37.1 C)] 98.2 F (36.8 C) (04/02 0621) Pulse Rate:  [62-84] 62  (04/02 0621) Resp:  [14-18] 17  (04/02 0621) BP: (140-207)/(50-65) 192/65 mmHg (04/02 0621) SpO2:  [94 %-99 %] 97 % (04/02 0621) Weight:  [90.719 kg (200 lb)] 90.719 kg (200 lb) (04/01 2014) Weight change:  Last BM Date: 05/12/11  Intake/Output from previous day: 04/01 0701 - 04/02 0700 In: -  Out: 850 [Urine:850]     Physical Exam: General: Alert, awake, oriented x3, in no acute distress. HEENT: No bruits, no goiter. Heart: Regular rate and rhythm, without murmurs, rubs, gallops. Lungs: Clear to auscultation bilaterally. Abdomen: Soft, mild tenderness on mid abdomen, nondistended, positive bowel sounds. Extremities: No clubbing cyanosis or edema with positive pedal pulses. Neuro: Grossly intact, nonfocal.    Lab Results: Basic Metabolic Panel:  Basename 05/13/11 0405 05/12/11 2041 05/12/11 1559  NA 146* -- 145  K 3.2* -- 3.2*  CL 108 -- 102  CO2 28 -- 30  GLUCOSE 103* -- 138*  BUN 22 -- 26*  CREATININE 1.19* -- 1.24*  CALCIUM 8.4 -- 9.5  MG -- 1.7 --  PHOS -- 4.0 --   Liver Function Tests:  Basename 05/12/11 1559  AST 29  ALT 13  ALKPHOS 112  BILITOT 0.3  PROT 7.7  ALBUMIN 3.5    Basename 05/12/11 1559  LIPASE 59  AMYLASE --    CBC:  Basename 05/13/11 0405 05/12/11 1559  WBC 10.4 11.5*  NEUTROABS -- 9.9*  HGB 10.9* 12.7  HCT 35.2* 40.8  MCV 80.7 80.6  PLT 296 423*   CBG:  Basename 05/13/11 0957 05/13/11 0619 05/13/11 0202 05/12/11 2221  GLUCAP 116* 90 109* 112*   Thyroid Function Tests:  Basename 05/12/11 2041  TSH 1.419  T4TOTAL --  FREET4 --  T3FREE --  THYROIDAB --   Urinalysis:  Basename 05/12/11 1546  COLORURINE YELLOW  LABSPEC 1.017  PHURINE 6.0    GLUCOSEU NEGATIVE  HGBUR NEGATIVE  BILIRUBINUR NEGATIVE  KETONESUR NEGATIVE  PROTEINUR 100*  UROBILINOGEN 0.2  NITRITE NEGATIVE  LEUKOCYTESUR TRACE*   Studies/Results: Dg Abd Acute W/chest  05/12/2011  *RADIOLOGY REPORT*  Clinical Data:  Abdominal pain, nausea, vomiting, diabetes, hypertension, weakness  ACUTE ABDOMEN SERIES (ABDOMEN 2 VIEW & CHEST 1 VIEW)  Comparison: None  Findings: Enlargement of cardiac silhouette. Calcified tortuous aorta. Pulmonary vascular congestion. Elevation of right diaphragm. No gross infiltrate or effusion. Surgical clips left axilla. Bones diffusely demineralized. Right hip prosthesis.  Large lobulated radiopacity in pelvis 10.0 x 8.8 cm likely a large densely calcified uterine leiomyoma. This is not felt to represent retained rectal GI contrast. Dilated small bowel loops out of proportion to the colonic gas, stool raising question of small bowel obstruction. No definite bowel wall thickening or free intraperitoneal air. Scattered air-fluid levels on left lateral decubitus view. Surgical clips right upper quadrant question cholecystectomy. Scattered vascular calcifications with bilateral renal artery stents.  IMPRESSION: Enlargement of cardiac silhouette with pulmonary vascular congestion. Question small bowel obstruction. Probable large calcified uterine leiomyoma.  Original Report Authenticated By: Lollie Marrow, M.D.    Medications: Scheduled Meds:   . antiseptic oral rinse  15 mL Mouth Rinse q12n4p  . chlorhexidine  15 mL Mouth Rinse BID  . cloNIDine  0.2 mg Transdermal Weekly  .  dorzolamide-timolol  1 drop Both Eyes QHS  . enoxaparin  40 mg Subcutaneous Q24H  . hydrALAZINE  10 mg Intravenous Q6H  . HYDROmorphone  1 mg Intravenous Once  . levothyroxine  76 mcg Intravenous Daily  . ondansetron  4 mg Intravenous Once  . pantoprazole (PROTONIX) IV  40 mg Intravenous QHS  . travoprost (benzalkonium)  1 drop Both Eyes QHS   Continuous Infusions:   . 0.9 %  NaCl with KCl 20 mEq / L 100 mL/hr at 05/13/11 0730  . DISCONTD: sodium chloride 1,000 mL (05/12/11 1812)   PRN Meds:.acetaminophen, acetaminophen, labetalol, morphine, ondansetron (ZOFRAN) IV, ondansetron  Assessment/Plan: 1-SBO (small bowel obstruction): improving; pretty much no abdominal pain today and minimal NGT output. Denies N/V. Will clamp NGT and advance to clear liquid diet if tolerated with d/c NGT and advance diet. Patient passing gases.  2-DM (diabetes mellitus): A1C pending (previous one in December 2012 was 6.7); Will change CBG to QAC/QHS and use SSI.   3-Anemia: chronic; will follow hgb trend; no need for transfusion. Will restart ferrous sulfate once tolerating full diet.  4-Thyroid disease: TSH WNL; will continue synthroid through IV, will use 1/2 of regular dose. Restart PO dose when tolerating PO.  5-Abdominal pain: associated with number 1; will use PRN pain meds; NGT to be clamped today. Diet will be advance to CLD.   6-Nausea & vomiting: associated with SBO; currently no an issue. Will clamp NGT. PRN antiemetics available.   7-Hypokalemia: repleted.   8-acute on chronic renal failure (stage 2): pre-renal in nature; Cr 1.19; pretty much back to baseline. Will continue monitoring Cr.  9-Dehydration: will provide IVF's.   10-Leukocytosis: 2/2 demargination; Resolved after hydration.  11-GERD/GI prophylaxis: Continue PPI.  12- Glaucoma: continue eye drops.  13-DVT: lovenox      LOS: 1 day   Emireth Cockerham [facility] [phone]  05/13/2011, 1:04 PM

## 2011-05-14 ENCOUNTER — Inpatient Hospital Stay (HOSPITAL_COMMUNITY): Payer: Medicare Other

## 2011-05-14 LAB — BASIC METABOLIC PANEL
BUN: 13 mg/dL (ref 6–23)
Chloride: 106 mEq/L (ref 96–112)
GFR calc Af Amer: 50 mL/min — ABNORMAL LOW (ref >90)
Glucose, Bld: 125 mg/dL — ABNORMAL HIGH (ref 70–99)
Potassium: 3.1 mEq/L — ABNORMAL LOW (ref 3.5–5.1)
Sodium: 143 mEq/L (ref 135–145)

## 2011-05-14 LAB — CBC
HCT: 36.5 % (ref 36.0–46.0)
Hemoglobin: 11.2 g/dL — ABNORMAL LOW (ref 12.0–15.0)
RDW: 15.7 % — ABNORMAL HIGH (ref 11.5–15.5)
WBC: 13.1 10*3/uL — ABNORMAL HIGH (ref 4.0–10.5)

## 2011-05-14 LAB — GLUCOSE, CAPILLARY
Glucose-Capillary: 113 mg/dL — ABNORMAL HIGH (ref 70–99)
Glucose-Capillary: 168 mg/dL — ABNORMAL HIGH (ref 70–99)
Glucose-Capillary: 149 mg/dL — ABNORMAL HIGH (ref 70–99)

## 2011-05-14 MED ORDER — AMLODIPINE BESYLATE 10 MG PO TABS
10.0000 mg | ORAL_TABLET | Freq: Every day | ORAL | Status: DC
  Administered 2011-05-14 – 2011-05-18 (×5): 10 mg via ORAL
  Filled 2011-05-14 (×5): qty 1

## 2011-05-14 MED ORDER — POTASSIUM CHLORIDE 10 MEQ/100ML IV SOLN
10.0000 meq | INTRAVENOUS | Status: AC
  Administered 2011-05-14 (×6): 10 meq via INTRAVENOUS
  Filled 2011-05-14 (×6): qty 100

## 2011-05-14 MED ORDER — METOPROLOL TARTRATE 100 MG PO TABS
100.0000 mg | ORAL_TABLET | Freq: Two times a day (BID) | ORAL | Status: DC
  Administered 2011-05-14 – 2011-05-18 (×8): 100 mg via ORAL
  Filled 2011-05-14 (×10): qty 1

## 2011-05-14 MED ORDER — ONDANSETRON HCL 4 MG PO TABS
4.0000 mg | ORAL_TABLET | ORAL | Status: DC | PRN
  Administered 2011-05-15 – 2011-05-17 (×2): 4 mg via ORAL
  Filled 2011-05-14 (×2): qty 1

## 2011-05-14 MED ORDER — METOPROLOL TARTRATE 100 MG PO TABS
100.0000 mg | ORAL_TABLET | Freq: Every morning | ORAL | Status: DC
  Administered 2011-05-14: 100 mg via ORAL
  Filled 2011-05-14: qty 1

## 2011-05-14 MED ORDER — POTASSIUM CHLORIDE 10 MEQ/100ML IV SOLN: 10.0000 meq | INTRAVENOUS | Status: DC

## 2011-05-14 MED ORDER — PROMETHAZINE HCL 25 MG/ML IJ SOLN
25.0000 mg | INTRAMUSCULAR | Status: DC | PRN
  Administered 2011-05-14 (×2): 25 mg via INTRAVENOUS
  Filled 2011-05-14 (×2): qty 1

## 2011-05-14 MED ORDER — MAGNESIUM SULFATE 40 MG/ML IJ SOLN
2.0000 g | Freq: Once | INTRAMUSCULAR | Status: AC
  Administered 2011-05-14: 2 g via INTRAVENOUS
  Filled 2011-05-14: qty 50

## 2011-05-14 MED ORDER — COLCHICINE 0.6 MG PO TABS
0.6000 mg | ORAL_TABLET | Freq: Two times a day (BID) | ORAL | Status: DC
  Administered 2011-05-14 – 2011-05-15 (×3): 0.6 mg via ORAL
  Filled 2011-05-14 (×4): qty 1

## 2011-05-14 MED ORDER — ONDANSETRON HCL 4 MG/2ML IJ SOLN: 4.0000 mg | INTRAMUSCULAR | Status: DC | PRN

## 2011-05-14 MED ORDER — POTASSIUM CHLORIDE 10 MEQ/100ML IV SOLN
10.0000 meq | INTRAVENOUS | Status: DC
  Administered 2011-05-14 (×2): 10 meq via INTRAVENOUS
  Filled 2011-05-14 (×6): qty 100

## 2011-05-14 NOTE — Progress Notes (Addendum)
Patient ID: Amanda Cunningham, female   DOB: [birth year], 78 y.o.   MRN: [id number]  Subjective: No events overnight. Patient denies chest pain, shortness of breath, abdominal pain. Pt reports still feeling nauseated but denies vomiting.   Objective:  Vital signs in last 24 hours:  Filed Vitals:   05/14/11 0001 05/14/11 0058 05/14/11 0548 05/14/11 0627  BP: 215/47 190/70 222/68 207/67  Pulse:   83   Temp:   98.8 F (37.1 C)   TempSrc:   Oral   Resp:   18   Height:      Weight:      SpO2:   97%     Intake/Output from previous day:   Intake/Output Summary (Last 24 hours) at 05/14/11 1113 Last data filed at 05/14/11 0900  Gross per 24 hour  Intake    900 ml  Output    880 ml  Net     20 ml    Physical Exam: General: Alert, awake, oriented x3, in no acute distress. HEENT: No bruits, no goiter. Moist mucous membranes, no scleral icterus, no conjunctival pallor. Heart: Regular rate and rhythm, S1/S2 +, no murmurs, rubs, gallops. Lungs: Clear to auscultation bilaterally. No wheezing, no rhonchi, no rales.  Abdomen: Soft, tender in epigastric area, nondistended, positive bowel sounds. NGT placed, draining with 300 cc in container noted this AM Extremities: No clubbing or cyanosis, no pitting edema,  positive pedal pulses. Left ankle swelling > right Neuro: Grossly nonfocal.  Lab Results:  Basic Metabolic Panel:  Lab 05/14/11 4098 05/13/11 0405 05/12/11 1559  WBC 13.1* 10.4 11.5*  HGB 11.2* 10.9* 12.7  HCT 36.5 35.2* 40.8  PLT 327 296 423*  MCV 82.2 80.7 80.6  MCH 25.2* 25.0* 25.1*  MCHC 30.7 31.0 31.1  RDW 15.7* 15.9* 15.9*  LYMPHSABS -- -- 0.9  MONOABS -- -- 0.6  EOSABS -- -- 0.0  BASOSABS -- -- 0.0  BANDABS -- -- --    Lab 05/14/11 0359 05/13/11 0405 05/12/11 2041 05/12/11 1559  NA 143 146* -- 145  K 3.1* 3.2* -- 3.2*  CL 106 108 -- 102  CO2 29 28 -- 30  GLUCOSE 125* 103* -- 138*  BUN 13 22 -- 26*  CREATININE 1.10 1.19* -- 1.24*  CALCIUM 8.4 8.4 -- 9.5  MG --  -- 1.7 --   Studies/Results: Dg Abd 2 Views 05/14/2011  IMPRESSION: No current evidence of obstruction.  No free air.   Dg Abd Acute W/chest 05/12/2011 IMPRESSION: Enlargement of cardiac silhouette with pulmonary vascular congestion. Question small bowel obstruction. Probable large calcified uterine leiomyoma.   Medications: Scheduled Meds:   . antiseptic oral rinse  15 mL Mouth Rinse q12n4p  . chlorhexidine  15 mL Mouth Rinse BID  . cloNIDine  0.2 mg Transdermal Weekly  . dorzolamide-timolol  1 drop Both Eyes QHS  . enoxaparin  40 mg Subcutaneous Q24H  . hydrALAZINE  10 mg Intravenous Q6H  . insulin aspart  0-9 Units Subcutaneous TID WC  . levothyroxine  76 mcg Intravenous Daily  . metoprolol tartrate  100 mg Oral BID  . pantoprazole (PROTONIX) IV  40 mg Intravenous QHS  . potassium chloride  10 mEq Intravenous Q1 Hr x 6  . potassium chloride  40 mEq Oral Q4H  . travoprost (benzalkonium)  1 drop Both Eyes QHS  . DISCONTD: enoxaparin  40 mg Subcutaneous Q24H  . DISCONTD: metoprolol tartrate  100 mg Oral q morning - 10a   Continuous Infusions:   .  0.9 % NaCl with KCl 20 mEq / L 50 mL/hr at 05/13/11 1315   PRN Meds:.acetaminophen, acetaminophen, labetalol, morphine, ondansetron (ZOFRAN) IV, ondansetron, promethazine, DISCONTD: ondansetron (ZOFRAN) IV, DISCONTD: ondansetron  Assessment/Plan:  Principal Problem:  *SBO (small bowel obstruction) - pt slightly clinically improving but still reports nausea which is not adequately controlled on Zofran alone - NGT with 300 cc output since last night - will continue to keep NGT in place and monitor output - will continue providing supportive care for now with antiemetics, analgesia - will not advance diet yet until symptomatically improved - may need surgery consult if no improvement by tomorrow am  Active Problems:  HTN, uncontrolled - will continue home medication regimen with metoprolol but will increase to 100 mg BID - continue  Clonidine and Hydralazine - monitor BP per floor protocol and readjust the medication regimen if indicated   DM (diabetes mellitus) - well controlled for now - will continue SSI and readjust the regimen if indicated   Anemia - Hg/Hct remains stable over 24 hours - CBC in AM   Thyroid disease - continue home dose regimen as noted above   Abdominal pain - pt denies abdominal pain this am but reports epigastric fullness - no obstruction noted on XRAY imaging - will continue providing supportive care   Nausea & vomiting - likely secondary to principal problem - will provide Phenergan PRN for refractory nausea and vomiting in addition to Zofran   Pulmonary vascular congestion - noted on CXR - d/c IVF for now - strict I's and O's   Hypokalemia - continue to supplement and will need to supplement magnesium as well   ARF (acute renal failure) - fluids provided - now resolved - BMP in AM   Leukocytosis - unclear etiology but given pt left foot and ankle pain and swelling in the ankle L > R. History of gout and increased uric acid this could be related to gout flare - will start Colchicine - CBC in AM   EDUCATION - test results and diagnostic studies were discussed with patient  - patient verbalized the understanding - questions were answered at the bedside and contact information was provided for additional questions or concerns   LOS: 2 days   MAGICK-Amanda Cunningham 05/14/2011, 11:13 AM  [facility] Pager: [phone]

## 2011-05-14 NOTE — Progress Notes (Signed)
Pt b/p was 207/67. MD also notified that pt has been nauseated throughout the night despite receiving zofran as ordered, NGT currently clampped. MD on call notified and orders received. Will continue to monitor.

## 2011-05-15 LAB — CBC
HCT: 38 % (ref 36.0–46.0)
Platelets: 310 10*3/uL (ref 150–400)
RDW: 15.7 % — ABNORMAL HIGH (ref 11.5–15.5)
WBC: 12.4 10*3/uL — ABNORMAL HIGH (ref 4.0–10.5)

## 2011-05-15 LAB — BASIC METABOLIC PANEL
BUN: 13 mg/dL (ref 6–23)
CO2: 29 mEq/L (ref 19–32)
Calcium: 8.8 mg/dL (ref 8.4–10.5)
GFR calc non Af Amer: 41 mL/min — ABNORMAL LOW (ref >90)
Glucose, Bld: 126 mg/dL — ABNORMAL HIGH (ref 70–99)

## 2011-05-15 LAB — GLUCOSE, CAPILLARY
Glucose-Capillary: 104 mg/dL — ABNORMAL HIGH (ref 70–99)
Glucose-Capillary: 144 mg/dL — ABNORMAL HIGH (ref 70–99)

## 2011-05-15 MED ORDER — POTASSIUM CHLORIDE CRYS ER 20 MEQ PO TBCR
40.0000 meq | EXTENDED_RELEASE_TABLET | Freq: Every day | ORAL | Status: DC
  Administered 2011-05-15 – 2011-05-16 (×2): 40 meq via ORAL
  Filled 2011-05-15 (×2): qty 2

## 2011-05-15 MED ORDER — LEVOTHYROXINE SODIUM 75 MCG PO TABS: 75.0000 ug | ORAL_TABLET | Freq: Every day | ORAL | Status: DC

## 2011-05-15 MED ORDER — LOPERAMIDE HCL 2 MG PO CAPS
2.0000 mg | ORAL_CAPSULE | Freq: Four times a day (QID) | ORAL | Status: DC | PRN
  Administered 2011-05-15 – 2011-05-16 (×2): 2 mg via ORAL
  Filled 2011-05-15 (×2): qty 1

## 2011-05-15 MED ORDER — LEVOTHYROXINE SODIUM 150 MCG PO TABS
150.0000 ug | ORAL_TABLET | Freq: Every day | ORAL | Status: DC
  Administered 2011-05-16 – 2011-05-18 (×3): 150 ug via ORAL
  Filled 2011-05-15 (×3): qty 1

## 2011-05-15 MED ORDER — PROMETHAZINE HCL 25 MG PO TABS
25.0000 mg | ORAL_TABLET | Freq: Four times a day (QID) | ORAL | Status: DC | PRN
  Administered 2011-05-15: 25 mg via ORAL
  Filled 2011-05-15: qty 1

## 2011-05-15 MED ORDER — HYDRALAZINE HCL 50 MG PO TABS
100.0000 mg | ORAL_TABLET | Freq: Three times a day (TID) | ORAL | Status: DC
  Administered 2011-05-15 – 2011-05-16 (×3): 100 mg via ORAL
  Filled 2011-05-15 (×5): qty 2

## 2011-05-15 MED ORDER — HYDRALAZINE HCL 50 MG PO TABS
50.0000 mg | ORAL_TABLET | Freq: Three times a day (TID) | ORAL | Status: DC
  Administered 2011-05-15: 50 mg via ORAL
  Filled 2011-05-15 (×6): qty 1

## 2011-05-15 MED ORDER — COLCHICINE 0.6 MG PO TABS
0.6000 mg | ORAL_TABLET | Freq: Every day | ORAL | Status: DC
  Filled 2011-05-15: qty 1

## 2011-05-15 MED ORDER — HYDRALAZINE HCL 20 MG/ML IJ SOLN: 10.0000 mg | Freq: Three times a day (TID) | INTRAMUSCULAR | Status: DC

## 2011-05-15 NOTE — Progress Notes (Signed)
Patient ID: Amanda Cunningham, female   DOB: 1919/09/25, 78 y.o.   MRN: [id number]  Subjective: No events overnight. Patient denies chest pain, shortness of breath, abdominal pain.   Objective:  Vital signs in last 24 hours:  Filed Vitals:   05/14/11 1325 05/14/11 2200 05/15/11 0255 05/15/11 0522  BP: 208/68 208/72 193/65 177/61  Pulse: 70 88  78  Temp: 99.2 F (37.3 C) 99.4 F (37.4 C)  97.8 F (36.6 C)  TempSrc: Oral Oral  Oral  Resp: 18 16  17   Height:      Weight:      SpO2: 97%   97%    Intake/Output from previous day:   Intake/Output Summary (Last 24 hours) at 05/15/11 1204 Last data filed at 05/15/11 0900  Gross per 24 hour  Intake   1350 ml  Output   1725 ml  Net   -375 ml    Physical Exam: General: Alert, awake, oriented x3, in no acute distress. HEENT: No bruits, no goiter. Moist mucous membranes, no scleral icterus, no conjunctival pallor. Heart: Regular rate and rhythm, S1/S2 +, no murmurs, rubs, gallops. Lungs: Clear to auscultation bilaterally. No wheezing, no rhonchi, no rales.  Abdomen: Soft, nontender, nondistended, positive bowel sounds. Extremities: No clubbing or cyanosis, trace bilateral lower extremity pitting edema,  positive pedal pulses. Neuro: Grossly nonfocal.  Lab Results:  Lab 05/15/11 0410 05/14/11 0359 05/13/11 0405 05/12/11 1559  WBC 12.4* 13.1* 10.4 11.5*  HGB 11.7* 11.2* 10.9* 12.7  HCT 38.0 36.5 35.2* 40.8  PLT 310 327 296 423*    Lab 05/15/11 0410 05/14/11 0359 05/13/11 0405 05/12/11 2041 05/12/11 1559  NA 138 143 146* -- 145  K 3.1* 3.1* 3.2* -- 3.2*  CL 101 106 108 -- 102  CO2 29 29 28  -- 30  GLUCOSE 126* 125* 103* -- 138*  BUN 13 13 22  -- 26*  CREATININE 1.14* 1.10 1.19* -- 1.24*  CALCIUM 8.8 8.4 8.4 -- 9.5    Studies/Results: Dg Abd 2 View 05/14/2011  IMPRESSION: No current evidence of obstruction.  No free air.    Medications: Scheduled Meds:   . amLODipine  10 mg Oral Daily  . antiseptic oral rinse  15 mL Mouth  Rinse q12n4p  . chlorhexidine  15 mL Mouth Rinse BID  . cloNIDine  0.2 mg Transdermal Weekly  . colchicine  0.6 mg Oral Daily  . dorzolamide-timolol  1 drop Both Eyes QHS  . enoxaparin  40 mg Subcutaneous Q24H  . hydrALAZINE  50 mg Oral Q8H  . insulin aspart  0-9 Units Subcutaneous TID WC  . levothyroxine  75 mcg Oral QAC breakfast  . magnesium sulfate 1 - 4 g bolus IVPB  2 g Intravenous Once  . metoprolol tartrate  100 mg Oral BID  . pantoprazole (PROTONIX) IV  40 mg Intravenous QHS  . potassium chloride  10 mEq Intravenous Q1 Hr x 6  . potassium chloride  40 mEq Oral Daily  . travoprost (benzalkonium)  1 drop Both Eyes QHS  . DISCONTD: colchicine  0.6 mg Oral BID  . DISCONTD: hydrALAZINE  10 mg Intravenous Q6H  . DISCONTD: hydrALAZINE  10 mg Intravenous Q8H  . DISCONTD: levothyroxine  76 mcg Intravenous Daily   Continuous Infusions:  PRN Meds:.acetaminophen, acetaminophen, labetalol, morphine, ondansetron (ZOFRAN) IV, ondansetron, promethazine, DISCONTD: promethazine  Assessment/Plan:  Principal Problem:  *SBO (small bowel obstruction)  - pt clinically improving but still reports occasional nausea which is adequately controlled on Zofran  -  NGT with 300 cc output since last night  - will d/c NGT today and monitor for progress - will continue providing supportive care for now with antiemetics, analgesia  - will advance diet to clear liquids - may need surgery consult if no improvement by tomorrow am   Active Problems:  HTN, uncontrolled  - will continue home medication regimen with metoprolol but will increase to 100 mg BID  - continue Clonidine and Hydralazine, Norvasc - monitor BP per floor protocol and readjust the medication regimen if indicated   DM (diabetes mellitus)  - well controlled for now  - will continue SSI and readjust the regimen if indicated   Anemia  - Hg/Hct remains stable over 24 hours  - CBC in AM   Thyroid disease  - continue home dose regimen  as noted above   Abdominal pain  - pt denies abdominal pain this am - no obstruction noted on XRAY imaging  - will continue providing supportive care   Nausea & vomiting  - likely secondary to principal problem  - will provide Phenergan PRN for refractory nausea and vomiting in addition to Zofran   Pulmonary vascular congestion  - noted on CXR  - d/c IVF for now  - strict I's and O's   Hypokalemia  - continue to supplement   ARF (acute renal failure)  - fluids provided  - BMP in AM   Leukocytosis  - unclear etiology but given pt left foot and ankle pain and swelling in the ankle L > R. History of gout and increased uric acid this could be related to gout flare  - will start Colchicine  - WBC trending down - CBC in AM   EDUCATION  - test results and diagnostic studies were discussed with patient  - patient verbalized the understanding  - questions were answered at the bedside and contact information was provided for additional questions or concerns    LOS: 3 days   MAGICK-[name] 05/15/2011, 12:04 PM  [facility] Pager: [phone]

## 2011-05-15 NOTE — Progress Notes (Signed)
Physician sticky note left this shift:  MD-  Pt has had 2 IV infiltrations on rt arm --- one on 4/1 and one on 4/3. We cannot use her lt arm d/t breast ca hx.   She is getting IV hydralazine and IV synthroid. Is there any way we can switch her to PO medications so she will not have to get another IV?  Also--pt has generalized edema and has voided only 625 mL dark yellow urine over a 12-hr period. BP has been elevated, despite receiving hydralazine and metoprolol.  Thanks! Earnest Conroy, RN 05/15/2011 03:34

## 2011-05-16 LAB — BASIC METABOLIC PANEL
BUN: 13 mg/dL (ref 6–23)
Calcium: 8.5 mg/dL (ref 8.4–10.5)
GFR calc Af Amer: 48 mL/min — ABNORMAL LOW (ref >90)
GFR calc non Af Amer: 41 mL/min — ABNORMAL LOW (ref >90)
Glucose, Bld: 116 mg/dL — ABNORMAL HIGH (ref 70–99)
Potassium: 3 mEq/L — ABNORMAL LOW (ref 3.5–5.1)
Sodium: 138 mEq/L (ref 135–145)

## 2011-05-16 LAB — CBC
HCT: 35.9 % — ABNORMAL LOW (ref 36.0–46.0)
Hemoglobin: 11.1 g/dL — ABNORMAL LOW (ref 12.0–15.0)
MCH: 25.2 pg — ABNORMAL LOW (ref 26.0–34.0)
MCHC: 30.9 g/dL (ref 30.0–36.0)
RDW: 15.2 % (ref 11.5–15.5)

## 2011-05-16 LAB — GLUCOSE, CAPILLARY
Glucose-Capillary: 106 mg/dL — ABNORMAL HIGH (ref 70–99)
Glucose-Capillary: 141 mg/dL — ABNORMAL HIGH (ref 70–99)

## 2011-05-16 MED ORDER — CLONIDINE HCL 0.2 MG/24HR TD PTWK: 0.2000 mg | MEDICATED_PATCH | TRANSDERMAL | Status: DC

## 2011-05-16 MED ORDER — ZOLPIDEM TARTRATE 5 MG PO TABS
5.0000 mg | ORAL_TABLET | Freq: Once | ORAL | Status: AC
  Administered 2011-05-16: 5 mg via ORAL
  Filled 2011-05-16: qty 1

## 2011-05-16 MED ORDER — ENOXAPARIN SODIUM 30 MG/0.3ML ~~LOC~~ SOLN
30.0000 mg | SUBCUTANEOUS | Status: DC
  Administered 2011-05-16: 30 mg via SUBCUTANEOUS
  Filled 2011-05-16 (×2): qty 0.3

## 2011-05-16 MED ORDER — PANTOPRAZOLE SODIUM 40 MG PO TBEC
40.0000 mg | DELAYED_RELEASE_TABLET | Freq: Every day | ORAL | Status: DC
  Administered 2011-05-16 – 2011-05-17 (×2): 40 mg via ORAL
  Filled 2011-05-16 (×3): qty 1

## 2011-05-16 MED ORDER — HYDRALAZINE HCL 50 MG PO TABS
100.0000 mg | ORAL_TABLET | Freq: Four times a day (QID) | ORAL | Status: DC
  Administered 2011-05-16 – 2011-05-18 (×7): 100 mg via ORAL
  Filled 2011-05-16 (×11): qty 2

## 2011-05-16 MED ORDER — POTASSIUM CHLORIDE CRYS ER 20 MEQ PO TBCR
40.0000 meq | EXTENDED_RELEASE_TABLET | Freq: Two times a day (BID) | ORAL | Status: DC
  Administered 2011-05-16 – 2011-05-18 (×5): 40 meq via ORAL
  Filled 2011-05-16 (×6): qty 2

## 2011-05-16 NOTE — Progress Notes (Signed)
Patient c/o insomnia. States she has taken [place] in the past and even takes it at home at times. NP paged orders received.

## 2011-05-16 NOTE — Progress Notes (Signed)
Patient without catapres patch on. Will notify for another patch from pharmacy.   -05/16/11-0720 verified with Asher Muir RN that no patch is on patient. Applied new patch to r arm.

## 2011-05-16 NOTE — Progress Notes (Signed)
After lab drew am labs patient found sitting in chair. States she had to "get up and go see what they were doing" Patient confused about place and time. C/o feet/legs aching. Assisted back to bed and patient back to sleep within 5 minutes.  Bed alarm on. Patient received ambien earlier. Will monitor.

## 2011-05-16 NOTE — Progress Notes (Signed)
The patient is receiving Protonix by the intravenous route.  Based on criteria approved by the Pharmacy and Therapeutics Committee and the Medical Executive Committee, the medication is being converted to the equivalent oral dose form.  These criteria include: -No Active GI bleeding -Able to tolerate diet of full liquids (or better) or tube feeding -Able to tolerate other medications by the oral or enteral route  If you have any questions about this conversion, please contact the Pharmacy Department (ext [phone]).  Thank you.  Otho Bellows, PHARMD 05/16/2011 2:56 PM

## 2011-05-16 NOTE — Progress Notes (Signed)
Patient ID: Amanda Cunningham, female   DOB: 05-Apr-1919, 78 y.o.   MRN: [id number]  Subjective: No events overnight. Patient denies chest pain, shortness of breath, abdominal pain.   Objective:  Vital signs in last 24 hours:  Filed Vitals:   05/15/11 1950 05/15/11 2150 05/15/11 2305 05/16/11 0601  BP:   177/54 168/52  Pulse: 80 70 67 72  Temp:   98.8 F (37.1 C) 98.5 F (36.9 C)  TempSrc:   Oral Oral  Resp:   16 18  Height:      Weight:      SpO2:   93% 94%    Intake/Output from previous day:   Intake/Output Summary (Last 24 hours) at 05/16/11 1420 Last data filed at 05/16/11 0844  Gross per 24 hour  Intake    580 ml  Output   1000 ml  Net   -420 ml    Physical Exam: General: Alert, awake, oriented x3, in no acute distress. HEENT: No bruits, no goiter. Moist mucous membranes, no scleral icterus, no conjunctival pallor. Heart: Regular rate and rhythm, S1/S2 +, no murmurs, rubs, gallops. Lungs: Clear to auscultation bilaterally. No wheezing, no rhonchi, no rales.  Abdomen: Soft, nontender, nondistended, positive bowel sounds. Extremities: No clubbing or cyanosis, no pitting edema,  positive pedal pulses. Neuro: Grossly nonfocal.  Lab Results:  Lab 05/16/11 0356 05/15/11 0410 05/14/11 0359 05/13/11 0405 05/12/11 1559  WBC 11.7* 12.4* 13.1* 10.4 11.5*  HGB 11.1* 11.7* 11.2* 10.9* 12.7  HCT 35.9* 38.0 36.5 35.2* 40.8  PLT 271 310 327 296 423*    Lab 05/16/11 0356 05/15/11 0410 05/14/11 0359 05/13/11 0405 05/12/11 2041 05/12/11 1559  NA 138 138 143 146* -- 145  K 3.0* 3.1* 3.1* 3.2* -- 3.2*  CL 101 101 106 108 -- 102  CO2 28 29 29 28  -- 30  GLUCOSE 116* 126* 125* 103* -- 138*  BUN 13 13 13 22  -- 26*  CREATININE 1.14* 1.14* 1.10 1.19* -- 1.24*  CALCIUM 8.5 8.8 8.4 8.4 -- 9.5  MG -- -- -- -- 1.7 --   Recent Results (from the past 240 hour(s))  CLOSTRIDIUM DIFFICILE BY PCR     Status: Normal   Collection Time   05/15/11  7:49 PM      Component Value Range Status  Comment   C difficile by pcr NEGATIVE  NEGATIVE  Final     Studies/Results: No results found.  Medications: Scheduled Meds:   . amLODipine  10 mg Oral Daily  . antiseptic oral rinse  15 mL Mouth Rinse q12n4p  . chlorhexidine  15 mL Mouth Rinse BID  . cloNIDine  0.2 mg Transdermal Weekly  . dorzolamide-timolol  1 drop Both Eyes QHS  . enoxaparin  40 mg Subcutaneous Q24H  . hydrALAZINE  100 mg Oral Q8H  . insulin aspart  0-9 Units Subcutaneous TID WC  . levothyroxine  150 mcg Oral QAC breakfast  . metoprolol tartrate  100 mg Oral BID  . pantoprazole (PROTONIX) IV  40 mg Intravenous QHS  . potassium chloride  40 mEq Oral BID  . travoprost (benzalkonium)  1 drop Both Eyes QHS  . zolpidem  5 mg Oral Once  . DISCONTD: cloNIDine  0.2 mg Transdermal Weekly  . DISCONTD: colchicine  0.6 mg Oral Daily  . DISCONTD: hydrALAZINE  50 mg Oral Q8H  . DISCONTD: potassium chloride  40 mEq Oral Daily   Continuous Infusions:  PRN Meds:.acetaminophen, acetaminophen, labetalol, loperamide, morphine, ondansetron (ZOFRAN) IV, ondansetron,  promethazine  Assessment/Plan:  Principal Problem:  *SBO (small bowel obstruction)  - pt clinically improving but still reports occasional nausea which is adequately controlled on Zofran  - NGT is out and pt has been tolerating clear liquid diet well - will advance diet further - will continue providing supportive care for now with antiemetics, analgesia   Active Problems:  HTN, uncontrolled  - will continue home medication regimen with metoprolol  - continue Clonidine and Hydralazine, Norvasc  - monitor BP per floor protocol and readjust the medication regimen if indicated   DM (diabetes mellitus)  - well controlled for now  - will continue SSI and readjust the regimen if indicated   Anemia  - Hg/Hct remains stable over 24 hours  - CBC in AM   Thyroid disease  - continue home dose regimen as noted above   Abdominal pain  - pt denies abdominal pain  this am  - no obstruction noted on XRAY imaging  - will continue providing supportive care   Nausea & vomiting  - likely secondary to principal problem  - will provide Phenergan PRN for refractory nausea and vomiting in addition to Zofran   Pulmonary vascular congestion  - noted on CXR  - d/c IVF for now  - strict I's and O's   Hypokalemia  - continue to supplement   ARF (acute renal failure)  - fluids provided  - BMP in AM   Leukocytosis  - WBC trending down  - CBC in AM   EDUCATION  - test results and diagnostic studies were discussed with patient  - patient verbalized the understanding  - questions were answered at the bedside and contact information was provided for additional questions or concerns    LOS: 4 days   MAGICK-Sydne Krahl 05/16/2011, 2:20 PM  [facility] Pager: [phone]

## 2011-05-17 LAB — CBC
HCT: 36 % (ref 36.0–46.0)
Hemoglobin: 11.3 g/dL — ABNORMAL LOW (ref 12.0–15.0)
MCH: 25.3 pg — ABNORMAL LOW (ref 26.0–34.0)
MCV: 80.5 fL (ref 78.0–100.0)
RBC: 4.47 MIL/uL (ref 3.87–5.11)

## 2011-05-17 LAB — BASIC METABOLIC PANEL
CO2: 27 mEq/L (ref 19–32)
Calcium: 8.5 mg/dL (ref 8.4–10.5)
Creatinine, Ser: 1.19 mg/dL — ABNORMAL HIGH (ref 0.50–1.10)
Glucose, Bld: 106 mg/dL — ABNORMAL HIGH (ref 70–99)

## 2011-05-17 LAB — GLUCOSE, CAPILLARY
Glucose-Capillary: 131 mg/dL — ABNORMAL HIGH (ref 70–99)
Glucose-Capillary: 125 mg/dL — ABNORMAL HIGH (ref 70–99)
Glucose-Capillary: 133 mg/dL — ABNORMAL HIGH (ref 70–99)
Glucose-Capillary: 149 mg/dL — ABNORMAL HIGH (ref 70–99)

## 2011-05-17 MED ORDER — CLONIDINE HCL 0.2 MG PO TABS
0.2000 mg | ORAL_TABLET | Freq: Two times a day (BID) | ORAL | Status: DC
  Administered 2011-05-17 (×2): 0.2 mg via ORAL
  Filled 2011-05-17 (×4): qty 1

## 2011-05-17 MED ORDER — ENOXAPARIN SODIUM 40 MG/0.4ML ~~LOC~~ SOLN
40.0000 mg | SUBCUTANEOUS | Status: DC
  Administered 2011-05-17: 40 mg via SUBCUTANEOUS
  Filled 2011-05-17 (×2): qty 0.4

## 2011-05-17 MED ORDER — HYDRALAZINE HCL 50 MG PO TABS
100.0000 mg | ORAL_TABLET | Freq: Once | ORAL | Status: AC
  Administered 2011-05-17: 100 mg via ORAL

## 2011-05-17 MED ORDER — ONDANSETRON HCL 4 MG PO TABS: 4.0000 mg | ORAL_TABLET | ORAL | Status: DC | PRN

## 2011-05-17 MED ORDER — POTASSIUM CHLORIDE CRYS ER 20 MEQ PO TBCR: 40.0000 meq | EXTENDED_RELEASE_TABLET | Freq: Two times a day (BID) | ORAL | Status: DC

## 2011-05-17 MED ORDER — PROMETHAZINE HCL 25 MG PO TABS: 25.0000 mg | ORAL_TABLET | Freq: Four times a day (QID) | ORAL | Status: DC | PRN

## 2011-05-17 NOTE — Progress Notes (Signed)
Patient currently has a blood pressure of 200/71, notified Dr. Verta Ellen. Placed orders given and pending discharge is currently on hold. Will continue to monitor throughout shift.

## 2011-05-17 NOTE — Progress Notes (Signed)
Patient ID: Amanda Cunningham, female   DOB: 08/10/1919, 78 y.o.   MRN: [id number]  Subjective: No events overnight. Patient denies chest pain, shortness of breath, abdominal pain. Had bowel movement and reports ambulating.  Objective:  Vital signs in last 24 hours:  Filed Vitals:   05/17/11 1105 05/17/11 1147 05/17/11 1342 05/17/11 1415  BP: 190/74 200/71 196/62 193/68  Pulse:   74   Temp:   98 F (36.7 C)   TempSrc:   Oral   Resp:   18   Height:      Weight:      SpO2:   93%     Intake/Output from previous day:   Intake/Output Summary (Last 24 hours) at 05/17/11 1532 Last data filed at 05/17/11 1200  Gross per 24 hour  Intake    840 ml  Output      4 ml  Net    836 ml    Physical Exam: General: Alert, awake, oriented x3, in no acute distress. HEENT: No bruits, no goiter. Moist mucous membranes, no scleral icterus, no conjunctival pallor. Heart: Regular rate and rhythm, S1/S2 +, no murmurs, rubs, gallops. Lungs: Clear to auscultation bilaterally. No wheezing, no rhonchi, no rales.  Abdomen: Soft, nontender, nondistended, positive bowel sounds. Extremities: No clubbing or cyanosis, no pitting edema,  positive pedal pulses. Neuro: Grossly nonfocal.  Lab Results:  Basic Metabolic Panel:    Component Value Date/Time   NA 138 05/17/2011 0418   K 3.5 05/17/2011 0418   CL 104 05/17/2011 0418   CO2 27 05/17/2011 0418   BUN 14 05/17/2011 0418   CREATININE 1.19* 05/17/2011 0418   GLUCOSE 106* 05/17/2011 0418   CALCIUM 8.5 05/17/2011 0418   CBC:    Component Value Date/Time   WBC 10.1 05/17/2011 0418   HGB 11.3* 05/17/2011 0418   HCT 36.0 05/17/2011 0418   PLT 309 05/17/2011 0418   MCV 80.5 05/17/2011 0418   NEUTROABS 9.9* 05/12/2011 1559   LYMPHSABS 0.9 05/12/2011 1559   MONOABS 0.6 05/12/2011 1559   EOSABS 0.0 05/12/2011 1559   BASOSABS 0.0 05/12/2011 1559      Lab 05/17/11 0418 05/16/11 0356 05/15/11 0410 05/14/11 0359 05/13/11 0405 05/12/11 1559  WBC 10.1 11.7* 12.4* 13.1* 10.4 --  HGB  11.3* 11.1* 11.7* 11.2* 10.9* --  HCT 36.0 35.9* 38.0 36.5 35.2* --  PLT 309 271 310 327 296 --  MCV 80.5 81.6 81.7 82.2 80.7 --  MCH 25.3* 25.2* 25.2* 25.2* 25.0* --  MCHC 31.4 30.9 30.8 30.7 31.0 --  RDW 15.4 15.2 15.7* 15.7* 15.9* --  LYMPHSABS -- -- -- -- -- 0.9  MONOABS -- -- -- -- -- 0.6  EOSABS -- -- -- -- -- 0.0  BASOSABS -- -- -- -- -- 0.0  BANDABS -- -- -- -- -- --    Lab 05/17/11 0418 05/16/11 0356 05/15/11 0410 05/14/11 0359 05/13/11 0405 05/12/11 2041  NA 138 138 138 143 146* --  K 3.5 3.0* 3.1* 3.1* 3.2* --  CL 104 101 101 106 108 --  CO2 27 28 29 29 28  --  GLUCOSE 106* 116* 126* 125* 103* --  BUN 14 13 13 13 22  --  CREATININE 1.19* 1.14* 1.14* 1.10 1.19* --  CALCIUM 8.5 8.5 8.8 8.4 8.4 --  MG -- -- -- -- -- 1.7   No results found for this basename: INR:5,PROTIME:5 in the last 168 hours Cardiac markers: No results found for this basename: CK:3,CKMB:3,TROPONINI:3,MYOGLOBIN:3 in the last 168  hours No components found with this basename: POCBNP:3 Recent Results (from the past 240 hour(s))  CLOSTRIDIUM DIFFICILE BY PCR     Status: Normal   Collection Time   05/15/11  7:49 PM      Component Value Range Status Comment   C difficile by pcr NEGATIVE  NEGATIVE  Final     Studies/Results: No results found.  Medications: Scheduled Meds:   . amLODipine  10 mg Oral Daily  . antiseptic oral rinse  15 mL Mouth Rinse q12n4p  . chlorhexidine  15 mL Mouth Rinse BID  . cloNIDine  0.2 mg Oral BID  . dorzolamide-timolol  1 drop Both Eyes QHS  . enoxaparin  40 mg Subcutaneous Q24H  . hydrALAZINE  100 mg Oral Q6H  . hydrALAZINE  100 mg Oral Once  . insulin aspart  0-9 Units Subcutaneous TID WC  . levothyroxine  150 mcg Oral QAC breakfast  . metoprolol tartrate  100 mg Oral BID  . pantoprazole  40 mg Oral Q1200  . potassium chloride  40 mEq Oral BID  . travoprost (benzalkonium)  1 drop Both Eyes QHS  . DISCONTD: cloNIDine  0.2 mg Transdermal Weekly  . DISCONTD: enoxaparin   30 mg Subcutaneous Q24H   Continuous Infusions:  PRN Meds:.acetaminophen, acetaminophen, labetalol, loperamide, morphine, ondansetron (ZOFRAN) IV, ondansetron, promethazine  Assessment/Plan:  Principal Problem:  *SBO (small bowel obstruction)  - pt clinically improving but still reports occasional nausea which is adequately controlled on Zofran  - NGT is out and pt has been tolerating clear liquid diet well  - will advance diet further  - will continue providing supportive care for now with antiemetics, analgesia   Active Problems:  HTN, uncontrolled  - will continue home medication regimen with metoprolol  - continue Clonidine but change to PO and Hydralazine, Norvasc  - monitor BP per floor protocol and readjust the medication regimen if indicated   DM (diabetes mellitus)  - well controlled for now  - will continue SSI and readjust the regimen if indicated   Anemia  - Hg/Hct remains stable over 24 hours  - CBC in AM   Thyroid disease  - continue home dose regimen as noted above   Abdominal pain  - pt denies abdominal pain this am  - no obstruction noted on XRAY imaging  - will continue providing supportive care   Nausea & vomiting  - likely secondary to principal problem  - will provide Phenergan PRN for refractory nausea and vomiting in addition to Zofran   Pulmonary vascular congestion  - noted on CXR  - d/c IVF for now  - strict I's and O's   Hypokalemia  - continue to supplement   ARF (acute renal failure)  - fluids provided  - BMP in AM   Leukocytosis  - WBC trending down  - CBC in AM   EDUCATION  - test results and diagnostic studies were discussed with patient  - patient verbalized the understanding  - questions were answered at the bedside and contact information was provided for additional questions or concerns    LOS: 5 days   Amanda Cunningham 05/17/2011, 3:32 PM  [facility] Pager: [phone]

## 2011-05-18 LAB — CBC
MCHC: 30.4 g/dL (ref 30.0–36.0)
MCV: 82.2 fL (ref 78.0–100.0)
Platelets: 262 10*3/uL (ref 150–400)
RDW: 15.7 % — ABNORMAL HIGH (ref 11.5–15.5)
WBC: 8.1 10*3/uL (ref 4.0–10.5)

## 2011-05-18 LAB — GLUCOSE, CAPILLARY

## 2011-05-18 LAB — BASIC METABOLIC PANEL
Chloride: 105 mEq/L (ref 96–112)
Creatinine, Ser: 1.43 mg/dL — ABNORMAL HIGH (ref 0.50–1.10)
GFR calc Af Amer: 36 mL/min — ABNORMAL LOW (ref >90)
GFR calc non Af Amer: 31 mL/min — ABNORMAL LOW (ref >90)

## 2011-05-18 MED ORDER — HYDRALAZINE HCL 100 MG PO TABS: 100.0000 mg | ORAL_TABLET | Freq: Three times a day (TID) | ORAL | Status: DC

## 2011-05-18 MED ORDER — POTASSIUM CHLORIDE CRYS ER 20 MEQ PO TBCR: 20.0000 meq | EXTENDED_RELEASE_TABLET | Freq: Two times a day (BID) | ORAL | Status: DC

## 2011-05-18 MED ORDER — CLONIDINE HCL 0.2 MG PO TABS: 0.2000 mg | ORAL_TABLET | Freq: Two times a day (BID) | ORAL | Status: DC

## 2011-05-18 MED ORDER — PROMETHAZINE HCL 25 MG PO TABS: 25.0000 mg | ORAL_TABLET | Freq: Four times a day (QID) | ORAL | Status: DC | PRN

## 2011-05-18 MED ORDER — METOPROLOL TARTRATE 100 MG PO TABS: 100.0000 mg | ORAL_TABLET | Freq: Two times a day (BID) | ORAL | Status: DC

## 2011-05-18 MED ORDER — CLONIDINE HCL 0.3 MG PO TABS
0.3000 mg | ORAL_TABLET | Freq: Two times a day (BID) | ORAL | Status: DC
  Administered 2011-05-18: 0.3 mg via ORAL
  Filled 2011-05-18 (×2): qty 1

## 2011-05-18 MED ORDER — ONDANSETRON HCL 4 MG PO TABS: 4.0000 mg | ORAL_TABLET | ORAL | Status: AC | PRN

## 2011-05-18 MED ORDER — ASPIRIN 81 MG PO TABS: 81.0000 mg | ORAL_TABLET | Freq: Every day | ORAL | Status: DC

## 2011-05-18 MED ORDER — AMLODIPINE BESYLATE 10 MG PO TABS: 10.0000 mg | ORAL_TABLET | Freq: Two times a day (BID) | ORAL | Status: DC

## 2011-05-18 NOTE — Discharge Instructions (Signed)
Ileus  The intestine (bowel, or gut) is a long muscular tube connecting your stomach to your rectum. If the intestine stops working, food cannot pass through. This is called an ileus. This can happen for a variety of reasons. Ileus is a major medical problem that usually requires hospitalization. If your intestine stops working because of a blockage, this is called a bowel obstruction, and is a different condition.  CAUSES   · Surgery in your abdomen. This can last from a few hours to a few days.  · An infection or inflammation in the belly (abdomen). This includes inflammation of the lining of the abdomen (peritonitis).  · Infection or inflammation in other parts of the body, such as pneumonia or pancreatitis.  · Passage of gallstones or kidney stones.  · Damage to the nerves or blood vessels which go to the bowel.  · Imbalance in the salts in the blood (electrolytes).  · Injury to the brain and or spinal cord.  · Medications. Many medications can cause ileus or make it worse. The most common of these are strong pain medications.  SYMPTOMS   Symptoms of bowel obstruction come from the bowel inactivity. They may include:  · Bloating. Your belly gets bigger (distension).  · Pain or discomfort in the abdomen.  · Poor appetite, feeling sick to your stomach (nausea) and vomiting.  · You may also not be able to hear your normal bowel sounds, such as "growling" in your stomach.  DIAGNOSIS   · Your history and a physical exam will usually suggest to your caregiver that you have an ileus.  · X-rays or a CT scan of your abdomen will confirm the diagnosis. X-rays, CT scans and lab tests may also suggest the cause.  TREATMENT   · Rest the intestine until it starts working again. This is most often accomplished by:  · Stopping intake of oral food and drink. Dehydration is prevented by using IV (intravenous) fluids.  · Sometimes, a naso-gastric tube (NG tube) is needed. This is a narrow plastic tube inserted through your nose  and into your stomach. It is connected to suction to keep the stomach emptied out. This also helps treat the nausea and vomiting.  · If there is an imbalance in the electrolytes, they are corrected with supplements in your intravenous fluids.  · Medications that might make an ileus worse might be stopped.  · There are no medications that reliably treat ileus, though your caregiver may suggest a trial of certain medications.  · If your condition is slow to resolve, you will be re-evaluated to be sure another condition, such as a blockage, is not present.  Ileus is common and usually has a good outcome. Depending on cause of your ileus, it usually can be treated by your caregivers with good results. Sometimes, specialists (surgeons or gastroenterologists) are asked to assist in your care.   HOME CARE INSTRUCTIONS   · Follow your caregiver's instructions regarding diet and fluid intake. This will usually include drinking plenty of clear fluids, avoiding alcohol and caffeine, and eating a gentle diet.  · Follow your caregiver's instructions regarding activity. A period of rest is sometimes advised before returning to work or school.  · Take only medications prescribed by your caregiver. Be especially careful with narcotic pain medication, which can slow your bowel activity and contribute to ileus.  · Keep any follow-up appointments with your caregiver or specialists.  SEEK MEDICAL CARE IF:   · You have a recurrence   of nausea, vomiting or abdominal discomfort.  · You develop fever of more than 102° F (38.9° C).  SEEK IMMEDIATE MEDICAL CARE IF:   · You have severe abdominal pain.  · You are unable to keep fluids down.  Document Released: 01/30/2003 Document Revised: 01/16/2011 Document Reviewed: 06/01/2008  [employer]® Patient Information ©2012 [employer].

## 2011-05-18 NOTE — Progress Notes (Signed)
Patient discharged home with daughter. Patient provided all discharge instructions and follow-up. In stable condition.

## 2011-05-18 NOTE — Discharge Summary (Signed)
Patient IDChaquetta Cunningham MRN: [id number] DOB/AGE: [birth year] 76 y.o.  Admit date: 05/12/2011 Discharge date: 05/18/2011  Primary Care Physician:  Evette Georges, MD, MD  Discharge Diagnoses:  Ileus with nausea and vomiting  Present on Admission:   Principal Problem:  *SBO (small bowel obstruction) Active Problems:  DM (diabetes mellitus)  Anemia  Thyroid disease  Abdominal pain  Nausea & vomiting  Hypokalemia  ARF (acute renal failure)  Dehydration  Leukocytosis  Medication List  As of 05/18/2011  9:37 AM   STOP taking these medications         metoprolol succinate 100 MG 24 hr tablet         TAKE these medications         amLODipine 10 MG tablet   Commonly known as: NORVASC   Take 1 tablet (10 mg total) by mouth 2 (two) times daily.      aspirin 81 MG tablet   Take 1 tablet (81 mg total) by mouth daily.      cloNIDine 0.2 MG tablet   Commonly known as: CATAPRES   Take 1 tablet (0.2 mg total) by mouth 2 (two) times daily.      clopidogrel 75 MG tablet   Commonly known as: PLAVIX   Take 75 mg by mouth daily.      dorzolamide-timolol 22.3-6.8 MG/ML ophthalmic solution   Commonly known as: COSOPT   Place 1 drop into both eyes at bedtime.      ferrous sulfate 325 (65 FE) MG tablet   Take 325 mg by mouth daily.      glimepiride 2 MG tablet   Commonly known as: AMARYL   Take 2 mg by mouth. One or half tab daily      glucose blood test strip   1 each by Other route as needed. Use as instructed      hydrALAZINE 100 MG tablet   Commonly known as: APRESOLINE   Take 1 tablet (100 mg total) by mouth 3 (three) times daily.      hydrochlorothiazide 25 MG tablet   Commonly known as: HYDRODIURIL   Take 25 mg by mouth daily.      levothyroxine 150 MCG tablet   Commonly known as: SYNTHROID, LEVOTHROID   Take 150 mcg by mouth daily.      metoprolol 100 MG tablet   Commonly known as: LOPRESSOR   Take 1 tablet (100 mg total) by mouth 2 (two) times daily.     multivitamin tablet   Take 1 tablet by mouth daily.      ondansetron 4 MG tablet   Commonly known as: ZOFRAN   Take 1 tablet (4 mg total) by mouth every 4 (four) hours as needed for nausea.      onetouch ultrasoft lancets   1 each by Other route as needed. Use as instructed      pantoprazole 40 MG tablet   Commonly known as: PROTONIX   Take 40 mg by mouth daily.      potassium chloride SA 20 MEQ tablet   Commonly known as: K-DUR,KLOR-CON   Take 1 tablet (20 mEq total) by mouth 2 (two) times daily.      PreserVision/Lutein Caps   Take 1 capsule by mouth 2 (two) times daily.      promethazine 25 MG tablet   Commonly known as: PHENERGAN   Take 1 tablet (25 mg total) by mouth every 6 (six) hours as needed for nausea (for refractory nausea).  TRAVATAN 0.004 % ophthalmic solution   Generic drug: travoprost (benzalkonium)   Place 1 drop into both eyes at bedtime.            Disposition and Follow-up: Patient was discharged from the hospital in stable condition and has reported no nausea or vomiting. In addition, she has been tolerating oral intake without difficulty. Following things need to be addressed on follow up appointment with primary care doctor in 2-3 weeks after the discharge:  1. Patient was admitted with ileus and has done well with medical management alone, requiring NO SURGICAL INTERVENTIONS, please ensure she is symptom free and continues to tolerate oral intake 2. Patient also admitted for dehydration and with acute kidney injury. It is unclear if she has some chronic injury as well but the kidney function tests were noted to trend down. She will need electrolyte panel obtained to make sure kidney tests remain stable and at her baseline. Given history of gout we have tried given Colchicine but I would avoid the medication as we noted her kidney tests were worse on the medication.  3. If patient requires long term gout medication, consider Allopurinol for adequate  control. 4. Patient had hypertension difficult to control and has required change in the medication dosing and please refer above for the medications and new dosages. If blood pressure improves please consider lowering the dose and frequency.  Consults:  none  Significant Diagnostic Studies:  Dg Abd Acute W/chest  05/12/2011  IMPRESSION: Enlargement of cardiac silhouette with pulmonary vascular congestion. Question small bowel obstruction. Probable large calcified uterine leiomyoma.   05/13/2011 IMPRESSION: No current evidence of obstruction. No free air.  Brief H and P: Patient is 78 y/o pleasant female who appears younger than her stated age with PMH of HTN, DM, hypothyroidism, CKD (stage 2), hx of breast cancer; hx of Cholecystectomy and previous SBO due to adhesions in the past; who came to the hospital concerned with abdominal pain, nausea and vomiting fthat started 2 days prior to admission. Patient reports symptoms similar to previous SBO episodes. She was having difficulty keeping things down and pain was becoming less tolerable. She decided to come to ED for further evaluation and treatment. In the ED patient's work up demonstrated abdominal x-ray with concerns for SBO; elevated creatinine at 1.24, hypokalemia and dehydration.   Physical Exam on Discharge:  Filed Vitals:   05/17/11 2015 05/18/11 0200 05/18/11 0535  BP: 165/68 150/54 148/55  Pulse: 62  63  Temp: 98.2 F (36.8 C)  97.6 F (36.4 C)  TempSrc: Oral  Oral  Resp: 18  18  SpO2: 95%  97%    Intake/Output Summary (Last 24 hours) at 05/18/11 0937 Last data filed at 05/18/11 0535  Gross per 24 hour  Intake   1680 ml  Output      2 ml  Net   1678 ml   General: Alert, awake, oriented x3, in no acute distress. HEENT: No bruits, no goiter. Heart: Regular rate and rhythm, without murmurs, rubs, gallops. Lungs: Clear to auscultation bilaterally. Abdomen: Soft, nontender, nondistended, positive bowel sounds. Extremities: No  clubbing cyanosis, trace bilateral ankle edema with positive pedal pulses. Neuro: Grossly intact, nonfocal.  CBC:    Component Value Date/Time   WBC 8.1 05/18/2011 0404   HGB 10.2* 05/18/2011 0404   HCT 33.6* 05/18/2011 0404   PLT 262 05/18/2011 0404   MCV 82.2 05/18/2011 0404   NEUTROABS 9.9* 05/12/2011 1559   LYMPHSABS 0.9 05/12/2011 1559  MONOABS 0.6 05/12/2011 1559   EOSABS 0.0 05/12/2011 1559   BASOSABS 0.0 05/12/2011 1559    Basic Metabolic Panel:    Component Value Date/Time   NA 139 05/18/2011 0404   K 4.3 05/18/2011 0404   CL 105 05/18/2011 0404   CO2 27 05/18/2011 0404   BUN 22 05/18/2011 0404   CREATININE 1.43* 05/18/2011 0404   GLUCOSE 155* 05/18/2011 0404   CALCIUM 8.3* 05/18/2011 0404    Hospital Course:  Principal Problem:  *SBO (small bowel obstruction)  - pt clinically improved on medical management alone and has not required surgical intervention - we have initially started pt on IVF, antiemetics, analgesia for adequate pain control - we placed NGT and kept pt NPO for 24 hours - NGT was pulled out and pt has been tolerating regular diet well - abdominal xray noted above has shown clearing up of an obstruction and no ileus - pt will need to see PCP in 2-3 weeks post discharge to ensure she continues to be symptom free  Active Problems:  HTN, uncontrolled  - BP has been difficult to control and home medications were continued but the frequency and dose was adjusted - this will also have to be followed by PCP and dose readjusted if indicated  DM (diabetes mellitus)  - well controlled during the hospitalization  Anemia  - Hg/Hct remained stable during the hospitalization - no signs of bleeding  Thyroid disease  - continued home dose regimen as noted above   Abdominal pain, nausea and vomiting - this was determined to be secondary to SBO - pt has responded well to medical management alone  Pulmonary vascular congestion  - was noted on initial XRAY but resolved on subsequent  imaging - pt has remained clinically stable  Hypokalemia  - secondary to vomiting - adequately supplement  ARF (acute renal failure)  - secondary to dehydration - fluids provided  - creatinine trending down  Leukocytosis  - secondary to SBO - resolved  EDUCATION  - test results and diagnostic studies were discussed with patient  - patient verbalized the understanding  - questions were answered at the bedside and contact information was provided for additional questions or concerns   Time spent on Discharge: Over 30 minutes  Signed: Debbora Presto 05/18/2011, 9:37 AM  [facility], pager #: [phone] Main office number: [phone]

## 2011-06-09 DIAGNOSIS — H3570 Unspecified separation of retinal layers: Secondary | ICD-10-CM | POA: Diagnosis not present

## 2011-06-09 DIAGNOSIS — H353190 Nonexudative age-related macular degeneration, unspecified eye, stage unspecified: Secondary | ICD-10-CM | POA: Diagnosis not present

## 2011-06-09 DIAGNOSIS — H3581 Retinal edema: Secondary | ICD-10-CM | POA: Diagnosis not present

## 2011-06-09 DIAGNOSIS — H353290 Exudative age-related macular degeneration, unspecified eye, stage unspecified: Secondary | ICD-10-CM | POA: Diagnosis not present

## 2011-06-12 DIAGNOSIS — C50919 Malignant neoplasm of unspecified site of unspecified female breast: Secondary | ICD-10-CM | POA: Diagnosis not present

## 2011-06-17 DIAGNOSIS — I10 Essential (primary) hypertension: Secondary | ICD-10-CM | POA: Diagnosis not present

## 2011-06-17 DIAGNOSIS — E119 Type 2 diabetes mellitus without complications: Secondary | ICD-10-CM | POA: Diagnosis not present

## 2011-06-17 DIAGNOSIS — R7989 Other specified abnormal findings of blood chemistry: Secondary | ICD-10-CM | POA: Diagnosis not present

## 2011-06-18 DIAGNOSIS — R062 Wheezing: Secondary | ICD-10-CM | POA: Diagnosis not present

## 2011-06-18 DIAGNOSIS — C50919 Malignant neoplasm of unspecified site of unspecified female breast: Secondary | ICD-10-CM | POA: Diagnosis not present

## 2011-06-18 DIAGNOSIS — R5381 Other malaise: Secondary | ICD-10-CM | POA: Diagnosis not present

## 2011-06-18 DIAGNOSIS — K56609 Unspecified intestinal obstruction, unspecified as to partial versus complete obstruction: Secondary | ICD-10-CM | POA: Diagnosis not present

## 2011-06-25 DIAGNOSIS — K560 Paralytic ileus: Secondary | ICD-10-CM | POA: Diagnosis not present

## 2011-06-25 DIAGNOSIS — E1159 Type 2 diabetes mellitus with other circulatory complications: Secondary | ICD-10-CM | POA: Diagnosis not present

## 2011-07-15 DIAGNOSIS — H3530 Unspecified macular degeneration: Secondary | ICD-10-CM | POA: Diagnosis not present

## 2011-07-15 DIAGNOSIS — E119 Type 2 diabetes mellitus without complications: Secondary | ICD-10-CM | POA: Diagnosis not present

## 2011-07-15 DIAGNOSIS — H4011X0 Primary open-angle glaucoma, stage unspecified: Secondary | ICD-10-CM | POA: Diagnosis not present

## 2011-07-21 DIAGNOSIS — R32 Unspecified urinary incontinence: Secondary | ICD-10-CM | POA: Diagnosis not present

## 2011-07-21 DIAGNOSIS — C50919 Malignant neoplasm of unspecified site of unspecified female breast: Secondary | ICD-10-CM | POA: Diagnosis not present

## 2011-07-21 DIAGNOSIS — R809 Proteinuria, unspecified: Secondary | ICD-10-CM | POA: Diagnosis not present

## 2011-07-21 DIAGNOSIS — R799 Abnormal finding of blood chemistry, unspecified: Secondary | ICD-10-CM | POA: Diagnosis not present

## 2011-07-21 DIAGNOSIS — R262 Difficulty in walking, not elsewhere classified: Secondary | ICD-10-CM | POA: Diagnosis not present

## 2011-07-21 DIAGNOSIS — I10 Essential (primary) hypertension: Secondary | ICD-10-CM | POA: Diagnosis not present

## 2011-07-21 DIAGNOSIS — E785 Hyperlipidemia, unspecified: Secondary | ICD-10-CM | POA: Diagnosis not present

## 2011-07-21 DIAGNOSIS — R269 Unspecified abnormalities of gait and mobility: Secondary | ICD-10-CM | POA: Diagnosis not present

## 2011-07-26 DIAGNOSIS — H353290 Exudative age-related macular degeneration, unspecified eye, stage unspecified: Secondary | ICD-10-CM | POA: Diagnosis not present

## 2011-08-01 DIAGNOSIS — R809 Proteinuria, unspecified: Secondary | ICD-10-CM | POA: Diagnosis not present

## 2011-08-01 DIAGNOSIS — R262 Difficulty in walking, not elsewhere classified: Secondary | ICD-10-CM | POA: Diagnosis not present

## 2011-08-01 DIAGNOSIS — E785 Hyperlipidemia, unspecified: Secondary | ICD-10-CM | POA: Diagnosis not present

## 2011-08-01 DIAGNOSIS — R269 Unspecified abnormalities of gait and mobility: Secondary | ICD-10-CM | POA: Diagnosis not present

## 2011-08-05 DIAGNOSIS — E119 Type 2 diabetes mellitus without complications: Secondary | ICD-10-CM | POA: Diagnosis not present

## 2011-08-05 DIAGNOSIS — I10 Essential (primary) hypertension: Secondary | ICD-10-CM | POA: Diagnosis not present

## 2011-08-05 DIAGNOSIS — R7989 Other specified abnormal findings of blood chemistry: Secondary | ICD-10-CM | POA: Diagnosis not present

## 2011-08-05 DIAGNOSIS — Z0000 Encounter for general adult medical examination without abnormal findings: Secondary | ICD-10-CM | POA: Diagnosis not present

## 2011-08-05 DIAGNOSIS — E785 Hyperlipidemia, unspecified: Secondary | ICD-10-CM | POA: Diagnosis not present

## 2011-08-21 DIAGNOSIS — M6281 Muscle weakness (generalized): Secondary | ICD-10-CM | POA: Diagnosis not present

## 2011-08-21 DIAGNOSIS — R269 Unspecified abnormalities of gait and mobility: Secondary | ICD-10-CM | POA: Diagnosis not present

## 2011-09-01 DIAGNOSIS — H353290 Exudative age-related macular degeneration, unspecified eye, stage unspecified: Secondary | ICD-10-CM | POA: Diagnosis not present

## 2011-09-01 DIAGNOSIS — H353190 Nonexudative age-related macular degeneration, unspecified eye, stage unspecified: Secondary | ICD-10-CM | POA: Diagnosis not present

## 2011-09-01 DIAGNOSIS — H3570 Unspecified separation of retinal layers: Secondary | ICD-10-CM | POA: Diagnosis not present

## 2011-09-02 DIAGNOSIS — R269 Unspecified abnormalities of gait and mobility: Secondary | ICD-10-CM | POA: Diagnosis not present

## 2011-09-02 DIAGNOSIS — M6281 Muscle weakness (generalized): Secondary | ICD-10-CM | POA: Diagnosis not present

## 2011-09-08 DIAGNOSIS — M6281 Muscle weakness (generalized): Secondary | ICD-10-CM | POA: Diagnosis not present

## 2011-09-08 DIAGNOSIS — R269 Unspecified abnormalities of gait and mobility: Secondary | ICD-10-CM | POA: Diagnosis not present

## 2011-09-15 DIAGNOSIS — M6281 Muscle weakness (generalized): Secondary | ICD-10-CM | POA: Diagnosis not present

## 2011-09-15 DIAGNOSIS — R269 Unspecified abnormalities of gait and mobility: Secondary | ICD-10-CM | POA: Diagnosis not present

## 2011-09-25 DIAGNOSIS — M6281 Muscle weakness (generalized): Secondary | ICD-10-CM | POA: Diagnosis not present

## 2011-09-25 DIAGNOSIS — R269 Unspecified abnormalities of gait and mobility: Secondary | ICD-10-CM | POA: Diagnosis not present

## 2011-10-01 DIAGNOSIS — E1142 Type 2 diabetes mellitus with diabetic polyneuropathy: Secondary | ICD-10-CM | POA: Diagnosis not present

## 2011-10-01 DIAGNOSIS — G4733 Obstructive sleep apnea (adult) (pediatric): Secondary | ICD-10-CM | POA: Diagnosis not present

## 2011-10-08 DIAGNOSIS — I6529 Occlusion and stenosis of unspecified carotid artery: Secondary | ICD-10-CM | POA: Diagnosis not present

## 2011-10-14 DIAGNOSIS — E559 Vitamin D deficiency, unspecified: Secondary | ICD-10-CM | POA: Diagnosis not present

## 2011-10-14 DIAGNOSIS — R269 Unspecified abnormalities of gait and mobility: Secondary | ICD-10-CM | POA: Diagnosis not present

## 2011-10-14 DIAGNOSIS — M6281 Muscle weakness (generalized): Secondary | ICD-10-CM | POA: Diagnosis not present

## 2011-10-18 DIAGNOSIS — H43819 Vitreous degeneration, unspecified eye: Secondary | ICD-10-CM | POA: Diagnosis not present

## 2011-10-18 DIAGNOSIS — H353290 Exudative age-related macular degeneration, unspecified eye, stage unspecified: Secondary | ICD-10-CM | POA: Diagnosis not present

## 2011-10-18 DIAGNOSIS — H353190 Nonexudative age-related macular degeneration, unspecified eye, stage unspecified: Secondary | ICD-10-CM | POA: Diagnosis not present

## 2011-10-18 DIAGNOSIS — H3581 Retinal edema: Secondary | ICD-10-CM | POA: Diagnosis not present

## 2011-10-18 DIAGNOSIS — E119 Type 2 diabetes mellitus without complications: Secondary | ICD-10-CM | POA: Diagnosis not present

## 2011-10-27 DIAGNOSIS — R269 Unspecified abnormalities of gait and mobility: Secondary | ICD-10-CM | POA: Diagnosis not present

## 2011-10-27 DIAGNOSIS — E559 Vitamin D deficiency, unspecified: Secondary | ICD-10-CM | POA: Diagnosis not present

## 2011-10-27 DIAGNOSIS — M6281 Muscle weakness (generalized): Secondary | ICD-10-CM | POA: Diagnosis not present

## 2011-11-05 DIAGNOSIS — E119 Type 2 diabetes mellitus without complications: Secondary | ICD-10-CM | POA: Diagnosis not present

## 2011-11-10 DIAGNOSIS — R269 Unspecified abnormalities of gait and mobility: Secondary | ICD-10-CM | POA: Diagnosis not present

## 2011-11-10 DIAGNOSIS — E559 Vitamin D deficiency, unspecified: Secondary | ICD-10-CM | POA: Diagnosis not present

## 2011-11-10 DIAGNOSIS — M6281 Muscle weakness (generalized): Secondary | ICD-10-CM | POA: Diagnosis not present

## 2011-11-19 DIAGNOSIS — E1159 Type 2 diabetes mellitus with other circulatory complications: Secondary | ICD-10-CM | POA: Diagnosis not present

## 2011-12-04 DIAGNOSIS — H3581 Retinal edema: Secondary | ICD-10-CM | POA: Diagnosis not present

## 2011-12-04 DIAGNOSIS — H353190 Nonexudative age-related macular degeneration, unspecified eye, stage unspecified: Secondary | ICD-10-CM | POA: Diagnosis not present

## 2011-12-04 DIAGNOSIS — H353290 Exudative age-related macular degeneration, unspecified eye, stage unspecified: Secondary | ICD-10-CM | POA: Diagnosis not present

## 2011-12-12 DIAGNOSIS — E559 Vitamin D deficiency, unspecified: Secondary | ICD-10-CM | POA: Diagnosis not present

## 2011-12-12 DIAGNOSIS — C50919 Malignant neoplasm of unspecified site of unspecified female breast: Secondary | ICD-10-CM | POA: Diagnosis not present

## 2011-12-16 DIAGNOSIS — C50919 Malignant neoplasm of unspecified site of unspecified female breast: Secondary | ICD-10-CM | POA: Diagnosis not present

## 2011-12-16 DIAGNOSIS — Z23 Encounter for immunization: Secondary | ICD-10-CM | POA: Diagnosis not present

## 2011-12-26 DIAGNOSIS — E559 Vitamin D deficiency, unspecified: Secondary | ICD-10-CM | POA: Diagnosis not present

## 2011-12-26 DIAGNOSIS — C50919 Malignant neoplasm of unspecified site of unspecified female breast: Secondary | ICD-10-CM | POA: Diagnosis not present

## 2011-12-29 DIAGNOSIS — H4011X0 Primary open-angle glaucoma, stage unspecified: Secondary | ICD-10-CM | POA: Diagnosis not present

## 2011-12-29 DIAGNOSIS — H3530 Unspecified macular degeneration: Secondary | ICD-10-CM | POA: Diagnosis not present

## 2011-12-29 DIAGNOSIS — E119 Type 2 diabetes mellitus without complications: Secondary | ICD-10-CM | POA: Diagnosis not present

## 2012-02-13 ENCOUNTER — Telehealth: Payer: Self-pay | Admitting: Family Medicine

## 2012-02-13 NOTE — Telephone Encounter (Signed)
Fleet Contras please call on Monday,,,,,,,,,,,, this will be okay

## 2012-02-13 NOTE — Telephone Encounter (Signed)
Patients daughter is calling to see if we can order labs for her mother to send back to [facility] clinic, her mother is staying her with her all winter

## 2012-02-23 DIAGNOSIS — H409 Unspecified glaucoma: Secondary | ICD-10-CM | POA: Diagnosis not present

## 2012-02-23 DIAGNOSIS — H4011X0 Primary open-angle glaucoma, stage unspecified: Secondary | ICD-10-CM | POA: Diagnosis not present

## 2012-02-23 DIAGNOSIS — H18899 Other specified disorders of cornea, unspecified eye: Secondary | ICD-10-CM | POA: Diagnosis not present

## 2012-02-23 DIAGNOSIS — H353290 Exudative age-related macular degeneration, unspecified eye, stage unspecified: Secondary | ICD-10-CM | POA: Diagnosis not present

## 2012-02-23 DIAGNOSIS — Z961 Presence of intraocular lens: Secondary | ICD-10-CM | POA: Diagnosis not present

## 2012-03-17 ENCOUNTER — Other Ambulatory Visit: Payer: Self-pay | Admitting: Family Medicine

## 2012-03-17 DIAGNOSIS — E119 Type 2 diabetes mellitus without complications: Secondary | ICD-10-CM

## 2012-03-17 DIAGNOSIS — D649 Anemia, unspecified: Secondary | ICD-10-CM

## 2012-03-19 ENCOUNTER — Other Ambulatory Visit (INDEPENDENT_AMBULATORY_CARE_PROVIDER_SITE_OTHER): Payer: Medicare Other

## 2012-03-19 DIAGNOSIS — E119 Type 2 diabetes mellitus without complications: Secondary | ICD-10-CM

## 2012-03-19 DIAGNOSIS — E559 Vitamin D deficiency, unspecified: Secondary | ICD-10-CM | POA: Diagnosis not present

## 2012-03-19 DIAGNOSIS — D649 Anemia, unspecified: Secondary | ICD-10-CM

## 2012-03-19 LAB — BASIC METABOLIC PANEL
BUN: 36 mg/dL — ABNORMAL HIGH (ref 6–23)
CO2: 23 mEq/L (ref 19–32)
Chloride: 106 mEq/L (ref 96–112)
Creatinine, Ser: 1.6 mg/dL — ABNORMAL HIGH (ref 0.4–1.2)
Potassium: 3.4 mEq/L — ABNORMAL LOW (ref 3.5–5.1)

## 2012-03-20 LAB — VITAMIN D 25 HYDROXY (VIT D DEFICIENCY, FRACTURES): Vit D, 25-Hydroxy: 41 ng/mL (ref 30–89)

## 2012-03-22 DIAGNOSIS — H4011X0 Primary open-angle glaucoma, stage unspecified: Secondary | ICD-10-CM | POA: Diagnosis not present

## 2012-03-22 DIAGNOSIS — H18899 Other specified disorders of cornea, unspecified eye: Secondary | ICD-10-CM | POA: Diagnosis not present

## 2012-03-22 DIAGNOSIS — Z961 Presence of intraocular lens: Secondary | ICD-10-CM | POA: Diagnosis not present

## 2012-03-22 DIAGNOSIS — H353290 Exudative age-related macular degeneration, unspecified eye, stage unspecified: Secondary | ICD-10-CM | POA: Diagnosis not present

## 2012-03-22 DIAGNOSIS — H18599 Other hereditary corneal dystrophies, unspecified eye: Secondary | ICD-10-CM | POA: Diagnosis not present

## 2012-03-22 DIAGNOSIS — H409 Unspecified glaucoma: Secondary | ICD-10-CM | POA: Diagnosis not present

## 2012-04-01 ENCOUNTER — Encounter: Payer: Self-pay | Admitting: Family Medicine

## 2012-04-01 ENCOUNTER — Ambulatory Visit (INDEPENDENT_AMBULATORY_CARE_PROVIDER_SITE_OTHER): Payer: Medicare Other | Admitting: Family Medicine

## 2012-04-01 VITALS — BP 140/80 | Temp 98.2°F | Wt 198.0 lb

## 2012-04-01 NOTE — Patient Instructions (Signed)
Continue your current medications except stop the hydrochlorothiazide  Followup labs in 3 weeks nonfasting

## 2012-04-01 NOTE — Progress Notes (Signed)
  Subjective:    Patient ID: Amanda Cunningham, female    DOB: January 18, 1920, 79 y.o.   MRN: [id number]  HPI Amanda Cunningham is a 79 year old female who comes in today for followup of renal insufficiency  In 2013 she was hospitalized for bowel obstruction at that time her blood pressure was low and therefore the Lopressor which she was taking 100 mg twice a day was stopped.  She's currently on Norvasc 10 mg, clonidine 0.2 mg twice a day, hydralazine 100 mg 3 times daily, hydrochlorothiazide 25 mg daily BP 140/80  Her renal function is beginning to deteriorate. Her BUN is up to 36 creatinine 1.6 GFR down to 33  She feels well except she has no energy   Review of Systems    review of systems otherwise negative Objective:   Physical Exam Well-developed well-nourished female no acute distress she is alert bright and oriented BP 140/80 pulse 70 and regular       Assessment & Plan:  Hypertension at goal  Deteriorating renal function hold diuretic recheck labs in 3 weeks

## 2012-04-20 ENCOUNTER — Other Ambulatory Visit (INDEPENDENT_AMBULATORY_CARE_PROVIDER_SITE_OTHER): Payer: Medicare Other

## 2012-04-20 LAB — BASIC METABOLIC PANEL
CO2: 23 mEq/L (ref 19–32)
Calcium: 9 mg/dL (ref 8.4–10.5)
Chloride: 107 mEq/L (ref 96–112)
Glucose, Bld: 153 mg/dL — ABNORMAL HIGH (ref 70–99)
Sodium: 140 mEq/L (ref 135–145)

## 2012-04-28 ENCOUNTER — Telehealth: Payer: Self-pay | Admitting: *Deleted

## 2012-04-28 ENCOUNTER — Telehealth: Payer: Self-pay | Admitting: Family Medicine

## 2012-04-28 NOTE — Telephone Encounter (Signed)
Patient would like to know if she should restart her HCTZ?  Okay to leave message

## 2012-04-28 NOTE — Telephone Encounter (Signed)
Spoke with daughter and copy ready for pick up

## 2012-04-28 NOTE — Telephone Encounter (Signed)
Pt called requesting results from her blood work done on 04/20/12. Please assist.

## 2012-04-29 DIAGNOSIS — E1149 Type 2 diabetes mellitus with other diabetic neurological complication: Secondary | ICD-10-CM | POA: Diagnosis not present

## 2012-04-29 DIAGNOSIS — I739 Peripheral vascular disease, unspecified: Secondary | ICD-10-CM | POA: Diagnosis not present

## 2012-04-29 DIAGNOSIS — E1159 Type 2 diabetes mellitus with other circulatory complications: Secondary | ICD-10-CM | POA: Diagnosis not present

## 2012-04-29 DIAGNOSIS — L608 Other nail disorders: Secondary | ICD-10-CM | POA: Diagnosis not present

## 2012-04-29 DIAGNOSIS — M79609 Pain in unspecified limb: Secondary | ICD-10-CM | POA: Diagnosis not present

## 2012-04-29 NOTE — Telephone Encounter (Signed)
Left message on machine for patient

## 2012-04-29 NOTE — Telephone Encounter (Signed)
Fleet Contras please call,,,,,,,,,,, continue to hold diuretic

## 2012-05-03 ENCOUNTER — Encounter: Payer: Self-pay | Admitting: Internal Medicine

## 2012-05-03 ENCOUNTER — Ambulatory Visit (INDEPENDENT_AMBULATORY_CARE_PROVIDER_SITE_OTHER): Payer: Medicare Other | Admitting: Internal Medicine

## 2012-05-03 VITALS — BP 190/82 | HR 80 | Temp 97.4°F | Wt 196.0 lb

## 2012-05-03 DIAGNOSIS — H353290 Exudative age-related macular degeneration, unspecified eye, stage unspecified: Secondary | ICD-10-CM | POA: Diagnosis not present

## 2012-05-03 DIAGNOSIS — H18599 Other hereditary corneal dystrophies, unspecified eye: Secondary | ICD-10-CM | POA: Diagnosis not present

## 2012-05-03 DIAGNOSIS — I701 Atherosclerosis of renal artery: Secondary | ICD-10-CM

## 2012-05-03 DIAGNOSIS — I10 Essential (primary) hypertension: Secondary | ICD-10-CM | POA: Diagnosis not present

## 2012-05-03 MED ORDER — HYDROCHLOROTHIAZIDE 12.5 MG PO CAPS: 12.5000 mg | ORAL_CAPSULE | Freq: Every day | ORAL | Status: DC

## 2012-05-03 MED ORDER — LABETALOL HCL 200 MG PO TABS: 200.0000 mg | ORAL_TABLET | Freq: Two times a day (BID) | ORAL | Status: DC

## 2012-05-03 NOTE — Assessment & Plan Note (Signed)
79 year old white female with history of poorly controlled hypertension experiencing exacerbation since discontinuing her hydrochlorothiazide. Patient also reports her metoprolol was discontinued at her last hospitalization. She does not recall why her beta blocker was discontinued. Add labetalol 200 mg twice daily. Also restart lower dose of hydrochlorothiazide 12.5 mg once daily. Reassess in one week. She has history of renal artery stenosis. Repeat renal artery Doppler.

## 2012-05-03 NOTE — Progress Notes (Signed)
Subjective:    Patient ID: Amanda Cunningham, female    DOB: [birth year], 79 y.o.   MRN: [id number]  HPI  79 year old white female with history of difficult to control hypertension, renal artery stenosis and chronic renal insufficiency presents with elevated blood pressure readings. She was recently seen by her ophthalmologist. He was unable to proceed with treatment for macular degeneration due to her significantly elevated blood pressure readings. Blood pressure readings at his office was recorded at 230/100.  Patient is on multiple antihypertensives. One month ago,  hydrochlorothiazide was discontinued due to worsening kidney function. She denies any recent dehydrated illness. She does not take any over-the-counter NSAIDs. Has been approximately one year since her last followup for renal artery stenosis.  Review of Systems Negative for chest pain, negative for headache  Past Medical History  Diagnosis Date  . Anemia   . Glaucoma   . Diabetes mellitus   . Hypertension   . Thyroid disease   . Arthritis   . Cancer     breast  . Chronic kidney disease   . PAD (peripheral artery disease)   . Neuropathy     History   Social History  . Marital Status: Married    Spouse Name: N/A    Number of Children: N/A  . Years of Education: N/A   Occupational History  . Not on file.   Social History Main Topics  . Smoking status: Never Smoker   . Smokeless tobacco: Not on file  . Alcohol Use: No  . Drug Use: No  . Sexually Active:    Other Topics Concern  . Not on file   Social History Narrative  . No narrative on file    Past Surgical History  Procedure Laterality Date  . Total hip arthroplasty      left  . Replacement total knee      right  . Eye surgery    . Cholecystectomy    . Breast lumpectomy      left  . Ureteral stent placement      Family History  Problem Relation Age of Onset  . Heart disease Mother   . Coronary artery disease Father   . Cancer Brother    prostate cancer    Allergies  Allergen Reactions  . Iodine Hives and Shortness Of Breath  . Tape     Plastic tape    Current Outpatient Prescriptions on File Prior to Visit  Medication Sig Dispense Refill  . amLODipine (NORVASC) 10 MG tablet Take 1 tablet (10 mg total) by mouth 2 (two) times daily.  31 tablet  3  . aspirin 81 MG tablet Take 1 tablet (81 mg total) by mouth daily.  30 tablet  1  . cloNIDine (CATAPRES) 0.2 MG tablet Take 1 tablet (0.2 mg total) by mouth 2 (two) times daily.  62 tablet  3  . clopidogrel (PLAVIX) 75 MG tablet Take 75 mg by mouth daily.        . dorzolamide-timolol (COSOPT) 22.3-6.8 MG/ML ophthalmic solution Place 1 drop into both eyes at bedtime.      . ferrous sulfate 325 (65 FE) MG tablet Take 325 mg by mouth daily.       Marland Kitchen glimepiride (AMARYL) 2 MG tablet Take 2 mg by mouth. One or half tab daily      . glucose blood test strip 1 each by Other route as needed. Use as instructed       . Lancets (ONETOUCH ULTRASOFT) lancets 1  each by Other route as needed. Use as instructed       . levothyroxine (SYNTHROID, LEVOTHROID) 150 MCG tablet Take 150 mcg by mouth daily.      . Multiple Vitamin (MULTIVITAMIN) tablet Take 1 tablet by mouth daily.        . Multiple Vitamins-Minerals (PRESERVISION/LUTEIN) CAPS Take 1 capsule by mouth 2 (two) times daily.       . pantoprazole (PROTONIX) 40 MG tablet Take 40 mg by mouth daily.        . potassium chloride SA (K-DUR,KLOR-CON) 20 MEQ tablet Take 1 tablet (20 mEq total) by mouth 2 (two) times daily.  62 tablet  1  . travoprost, benzalkonium, (TRAVATAN) 0.004 % ophthalmic solution Place 1 drop into both eyes at bedtime.       . promethazine (PHENERGAN) 25 MG tablet Take 1 tablet (25 mg total) by mouth every 6 (six) hours as needed for nausea (for refractory nausea).  45 tablet  1   No current facility-administered medications on file prior to visit.    BP 190/82  Pulse 80  Temp(Src) 97.4 F (36.3 C) (Oral)  Wt 196 lb  (88.905 kg)  BMI 37.05 kg/m2  SpO2 92%       Objective:   Physical Exam  Constitutional: She is oriented to person, place, and time. She appears well-developed and well-nourished.  HENT:  Head: Normocephalic and atraumatic.  Eyes: EOM are normal. Pupils are equal, round, and reactive to light.  Neck: Neck supple.  Cardiovascular: Normal rate, regular rhythm and normal heart sounds.   Pulmonary/Chest: Effort normal and breath sounds normal. She has no wheezes.  Lymphadenopathy:    She has no cervical adenopathy.  Neurological: She is alert and oriented to person, place, and time. No cranial nerve deficit.  Skin: Skin is warm and dry.  Psychiatric: She has a normal mood and affect. Her behavior is normal.          Assessment & Plan:

## 2012-05-04 ENCOUNTER — Ambulatory Visit: Payer: Medicare Other | Admitting: Family

## 2012-05-07 ENCOUNTER — Telehealth: Payer: Self-pay | Admitting: Family Medicine

## 2012-05-07 NOTE — Telephone Encounter (Signed)
Patient Information:  Caller Name: [name]  Phone: [phone]  Patient: Amanda Cunningham, Amanda Cunningham  Gender: Female  DOB: 10/24/1919  Age: 79 Years  PCP: Amanda Cunningham [facility])  Office Follow Up:  Does the office need to follow up with this patient?: Yes  Instructions For The Office: Patient concerned about her low blood pressure . "i dont feel well.  Please contact  RN Note:  Please contact patient regarding ALL OF HER BLOOD PRESSURE MEDICATIONS. she does not feel well.  [phone]  Symptoms  Reason For Call & Symptoms: Patient is calling concerning her Blood pressure.  She was seen by Dr. Artist Pais. She is being  sent for U/S for renal arteries.  She states that he added Labetalol 200mg  bid and added back HCTZ 12.5 mg once daily.  She feels like her pressure is too low . It 131/59.  "I dont' feel good" I feel weak.  She  IS ALSO TAKING, Norvasc 10mg  bid, Clonidine 0.2mg  and Hydralazine 100mg  bid.   She is very concerned about her B/P  Reviewed Health History In EMR: Yes  Reviewed Medications In EMR: Yes  Reviewed Allergies In EMR: Yes  Reviewed Surgeries / Procedures: Yes  Date of Onset of Symptoms: 05/03/2012  Treatments Tried: addition of 2 new medications.  Treatments Tried Worked: No  Guideline(s) Used:  High Blood Pressure  Disposition Per Guideline:   Discuss with PCP and Callback by Nurse Today  Reason For Disposition Reached:   Taking BP medications and feels is having side effects (e.g., impotence, cough, dizziness)  Advice Given:  BP less than 120 / 80   This is considered normal blood pressure  Lifestyle Changes  Maintain a healthy weight. Lose weight if you are overweight.  Eat a diet high in fresh fruits and low-fat dairy products. Limit your intake of saturated and total fat. Choose foods that are lower in salt.  If you smoke, you should stop.  If you drink alcohol, you should limit your daily alcohol drinking. Women should have no more than one drink per day. Men should  have no more than 2 drinks per day. A drink is defined as 1.5 oz hard liquor (one shot or jigger; 45 ml), 5 oz wine (small glass; 150 ml), or 12 oz beer (one can; 360 ml).  Call Back If:  Headache, blurred vision, difficulty talking, or difficulty walking occurs  Chest pain or difficulty breathing occurs  You become worse.  Patient Will Follow Care Advice:  YES

## 2012-05-07 NOTE — Telephone Encounter (Signed)
Patient following up on hr concerns about her Blood pressure medication.  See prior message. Advised patient to give physician time to review medication and staff will update her.  Advised to call back for any worsening , chanbes or concerns.

## 2012-05-10 NOTE — Telephone Encounter (Signed)
Per Dr Tawanna Cooler.  Patient to stop  diuretic and labetalol.  After 2 days she should start the labetalol but only 100 mg daily.  Left detailed message on machine for patient.

## 2012-05-14 ENCOUNTER — Encounter (INDEPENDENT_AMBULATORY_CARE_PROVIDER_SITE_OTHER): Payer: Medicare Other

## 2012-05-14 DIAGNOSIS — I701 Atherosclerosis of renal artery: Secondary | ICD-10-CM | POA: Diagnosis not present

## 2012-05-17 ENCOUNTER — Encounter: Payer: Self-pay | Admitting: Internal Medicine

## 2012-05-17 ENCOUNTER — Ambulatory Visit (INDEPENDENT_AMBULATORY_CARE_PROVIDER_SITE_OTHER): Payer: Medicare Other | Admitting: Internal Medicine

## 2012-05-17 VITALS — BP 170/62 | HR 72 | Temp 97.4°F | Wt 208.0 lb

## 2012-05-17 DIAGNOSIS — I10 Essential (primary) hypertension: Secondary | ICD-10-CM

## 2012-05-17 MED ORDER — LABETALOL HCL 200 MG PO TABS: 100.0000 mg | ORAL_TABLET | Freq: Two times a day (BID) | ORAL | Status: DC

## 2012-05-17 NOTE — Assessment & Plan Note (Addendum)
Hydrochlorothiazide and labetalol dose decreased by Dr. Tawanna Cooler due to symptoms of weakness and hypotension. Blood pressure is labile. Patient feeling better but systolic blood pressure again greater than 170. Increase labetalol to 100 mg twice daily. Followup with her PCP in 2 weeks. We are awaiting results of renal artery Doppler. If it does not show any significant change, consider adding low dose ARB.  Addendum: Received a copy of renal artery Doppler.  Impressions-technically challenging study. Normal caliber abdominal aorta. Asymmetric kidney size, left appearing smaller. Large cystic area in the proximity of left kidney. Normal renal arteries bilaterally.  Consider nephrology consult

## 2012-05-17 NOTE — Patient Instructions (Signed)
Please follow up with Dr. Tawanna Cooler with 2 weeks

## 2012-05-17 NOTE — Progress Notes (Signed)
Subjective:    Patient ID: Amanda Cunningham, female    DOB: 10-Jan-1920, 79 y.o.   MRN: [id number]  HPI  79 year old white female with difficult to control hypertension, renal artery stenosis and chronic renal insufficiency for followup regarding malignant hypertension. At previous visit, patient was started on labetalol 200 mg twice daily and restarted on hydrochlorothiazide 12.5 mg once daily.  Patient contacted our office several days after starting medication complaining of not feeling well and low blood pressure readings. Her systolic blood pressure was 135. Dr. Tawanna Cooler instructed patient to decrease labetalol to 100 mg daily and discontinue hydrochlorothiazide.  Her blood pressure is again elevated but her symptoms of fatigue and dizziness have resolved. We are still awaiting results of followup renal artery Doppler.  Review of Systems See HPI  Past Medical History  Diagnosis Date  . Anemia   . Glaucoma   . Diabetes mellitus   . Hypertension   . Thyroid disease   . Arthritis   . Cancer     breast  . Chronic kidney disease   . PAD (peripheral artery disease)   . Neuropathy     History   Social History  . Marital Status: Married    Spouse Name: N/A    Number of Children: N/A  . Years of Education: N/A   Occupational History  . Not on file.   Social History Main Topics  . Smoking status: Never Smoker   . Smokeless tobacco: Not on file  . Alcohol Use: No  . Drug Use: No  . Sexually Active:    Other Topics Concern  . Not on file   Social History Narrative  . No narrative on file    Past Surgical History  Procedure Laterality Date  . Total hip arthroplasty      left  . Replacement total knee      right  . Eye surgery    . Cholecystectomy    . Breast lumpectomy      left  . Ureteral stent placement      Family History  Problem Relation Age of Onset  . Heart disease Mother   . Coronary artery disease Father   . Cancer Brother     prostate cancer     Allergies  Allergen Reactions  . Iodine Hives and Shortness Of Breath  . Tape     Plastic tape    Current Outpatient Prescriptions on File Prior to Visit  Medication Sig Dispense Refill  . amLODipine (NORVASC) 10 MG tablet Take 1 tablet (10 mg total) by mouth 2 (two) times daily.  31 tablet  3  . aspirin 81 MG tablet Take 1 tablet (81 mg total) by mouth daily.  30 tablet  1  . cloNIDine (CATAPRES) 0.2 MG tablet Take 1 tablet (0.2 mg total) by mouth 2 (two) times daily.  62 tablet  3  . clopidogrel (PLAVIX) 75 MG tablet Take 75 mg by mouth daily.        . dorzolamide-timolol (COSOPT) 22.3-6.8 MG/ML ophthalmic solution Place 1 drop into both eyes at bedtime.      . ferrous sulfate 325 (65 FE) MG tablet Take 325 mg by mouth daily.       Marland Kitchen glimepiride (AMARYL) 2 MG tablet Take 2 mg by mouth. One or half tab daily      . glucose blood test strip 1 each by Other route as needed. Use as instructed       . hydrALAZINE (APRESOLINE) 100 MG  tablet Take 1 tablet (100 mg total) by mouth 2 (two) times daily.  90 tablet  3  . Lancets (ONETOUCH ULTRASOFT) lancets 1 each by Other route as needed. Use as instructed       . levothyroxine (SYNTHROID, LEVOTHROID) 150 MCG tablet Take 150 mcg by mouth daily.      . Multiple Vitamins-Minerals (PRESERVISION/LUTEIN) CAPS Take 1 capsule by mouth 2 (two) times daily.       . pantoprazole (PROTONIX) 40 MG tablet Take 40 mg by mouth daily.        . potassium chloride SA (K-DUR,KLOR-CON) 20 MEQ tablet Take 1 tablet (20 mEq total) by mouth 2 (two) times daily.  62 tablet  1  . travoprost, benzalkonium, (TRAVATAN) 0.004 % ophthalmic solution Place 1 drop into both eyes at bedtime.       . promethazine (PHENERGAN) 25 MG tablet Take 1 tablet (25 mg total) by mouth every 6 (six) hours as needed for nausea (for refractory nausea).  45 tablet  1   No current facility-administered medications on file prior to visit.    BP 170/62  Pulse 72  Temp(Src) 97.4 F (36.3 C)  (Oral)  Wt 208 lb (94.348 kg)  BMI 39.32 kg/m2       Objective:   Physical Exam  Constitutional: She is oriented to person, place, and time. She appears well-developed and well-nourished.  Cardiovascular: Normal rate, regular rhythm and normal heart sounds.   Pulmonary/Chest: Effort normal and breath sounds normal. She has no wheezes.  Neurological: She is alert and oriented to person, place, and time. No cranial nerve deficit.  Psychiatric: She has a normal mood and affect. Her behavior is normal.          Assessment & Plan:

## 2012-05-18 LAB — BASIC METABOLIC PANEL
BUN: 28 mg/dL — ABNORMAL HIGH (ref 6–23)
Chloride: 109 mEq/L (ref 96–112)
Creatinine, Ser: 1.2 mg/dL (ref 0.4–1.2)
Glucose, Bld: 114 mg/dL — ABNORMAL HIGH (ref 70–99)
Potassium: 4.1 mEq/L (ref 3.5–5.1)

## 2012-05-25 DIAGNOSIS — H18899 Other specified disorders of cornea, unspecified eye: Secondary | ICD-10-CM | POA: Diagnosis not present

## 2012-05-25 DIAGNOSIS — H18599 Other hereditary corneal dystrophies, unspecified eye: Secondary | ICD-10-CM | POA: Diagnosis not present

## 2012-05-28 DIAGNOSIS — Z961 Presence of intraocular lens: Secondary | ICD-10-CM | POA: Diagnosis not present

## 2012-05-28 DIAGNOSIS — H18599 Other hereditary corneal dystrophies, unspecified eye: Secondary | ICD-10-CM | POA: Diagnosis not present

## 2012-05-28 DIAGNOSIS — H353290 Exudative age-related macular degeneration, unspecified eye, stage unspecified: Secondary | ICD-10-CM | POA: Diagnosis not present

## 2012-06-08 ENCOUNTER — Ambulatory Visit (INDEPENDENT_AMBULATORY_CARE_PROVIDER_SITE_OTHER): Payer: Medicare Other | Admitting: Family Medicine

## 2012-06-08 ENCOUNTER — Encounter: Payer: Self-pay | Admitting: Family Medicine

## 2012-06-08 VITALS — BP 170/70 | Temp 97.7°F | Wt 206.0 lb

## 2012-06-08 DIAGNOSIS — E119 Type 2 diabetes mellitus without complications: Secondary | ICD-10-CM | POA: Diagnosis not present

## 2012-06-08 DIAGNOSIS — I10 Essential (primary) hypertension: Secondary | ICD-10-CM

## 2012-06-08 NOTE — Progress Notes (Signed)
  Subjective:    Patient ID: Amanda Cunningham, female    DOB: [birth year], 79 y.o.   MRN: [id number]  Amanda Cunningham is a delightful 79 year old married female nonsmoker who comes in today for followup of 2 problems  She is on a number of antihypertensive medications. Recent renal artery scan shows normal patent renal arteries. Her blood pressure at home is running 156-164 systolic diastolics are running in the 70 range. BP today 170/70 right arm sitting position  Her diabetes is under good control with her oral medication. Recent blood sugar 3 weeks ago was 114.  She's not taking iron anymore her anemia has resolved    Review of Systems    review of systems otherwise negative Objective:   Physical Exam  Well-developed well-nourished female no acute distress BP right arm sitting position 170/70 pulse 70 and regular      Assessment & Plan:  Hypertension,,,,,,,,,,, at goal for age continue current therapy followup in 3 months  Diabetes at goal continue current therapy followup in 3 months

## 2012-06-08 NOTE — Patient Instructions (Signed)
Continue your current medications  Check a blood pressure once weekly  Followup in 3 months sooner if any problems

## 2012-06-24 ENCOUNTER — Other Ambulatory Visit: Payer: Self-pay | Admitting: Internal Medicine

## 2012-06-25 DIAGNOSIS — Z961 Presence of intraocular lens: Secondary | ICD-10-CM | POA: Diagnosis not present

## 2012-06-25 DIAGNOSIS — H353290 Exudative age-related macular degeneration, unspecified eye, stage unspecified: Secondary | ICD-10-CM | POA: Diagnosis not present

## 2012-06-25 DIAGNOSIS — H4011X0 Primary open-angle glaucoma, stage unspecified: Secondary | ICD-10-CM | POA: Diagnosis not present

## 2012-06-25 DIAGNOSIS — H409 Unspecified glaucoma: Secondary | ICD-10-CM | POA: Diagnosis not present

## 2012-06-25 DIAGNOSIS — H18899 Other specified disorders of cornea, unspecified eye: Secondary | ICD-10-CM | POA: Diagnosis not present

## 2012-06-28 DIAGNOSIS — H18599 Other hereditary corneal dystrophies, unspecified eye: Secondary | ICD-10-CM | POA: Diagnosis not present

## 2012-06-28 DIAGNOSIS — H18899 Other specified disorders of cornea, unspecified eye: Secondary | ICD-10-CM | POA: Diagnosis not present

## 2012-07-06 ENCOUNTER — Other Ambulatory Visit: Payer: Self-pay | Admitting: *Deleted

## 2012-07-06 MED ORDER — LABETALOL HCL 200 MG PO TABS: 200.0000 mg | ORAL_TABLET | Freq: Two times a day (BID) | ORAL | Status: DC

## 2012-07-12 DIAGNOSIS — Z1231 Encounter for screening mammogram for malignant neoplasm of breast: Secondary | ICD-10-CM | POA: Diagnosis not present

## 2012-07-20 DIAGNOSIS — C50919 Malignant neoplasm of unspecified site of unspecified female breast: Secondary | ICD-10-CM | POA: Diagnosis not present

## 2012-07-20 DIAGNOSIS — E1169 Type 2 diabetes mellitus with other specified complication: Secondary | ICD-10-CM | POA: Diagnosis not present

## 2012-07-26 DIAGNOSIS — H3530 Unspecified macular degeneration: Secondary | ICD-10-CM | POA: Diagnosis not present

## 2012-07-26 DIAGNOSIS — E119 Type 2 diabetes mellitus without complications: Secondary | ICD-10-CM | POA: Diagnosis not present

## 2012-07-26 DIAGNOSIS — H4011X0 Primary open-angle glaucoma, stage unspecified: Secondary | ICD-10-CM | POA: Diagnosis not present

## 2012-07-29 DIAGNOSIS — H353290 Exudative age-related macular degeneration, unspecified eye, stage unspecified: Secondary | ICD-10-CM | POA: Diagnosis not present

## 2012-08-03 DIAGNOSIS — E119 Type 2 diabetes mellitus without complications: Secondary | ICD-10-CM | POA: Diagnosis not present

## 2012-08-03 DIAGNOSIS — Z0000 Encounter for general adult medical examination without abnormal findings: Secondary | ICD-10-CM | POA: Diagnosis not present

## 2012-08-03 DIAGNOSIS — E785 Hyperlipidemia, unspecified: Secondary | ICD-10-CM | POA: Diagnosis not present

## 2012-08-03 DIAGNOSIS — M1990 Unspecified osteoarthritis, unspecified site: Secondary | ICD-10-CM | POA: Diagnosis not present

## 2012-08-06 DIAGNOSIS — E559 Vitamin D deficiency, unspecified: Secondary | ICD-10-CM | POA: Diagnosis not present

## 2012-08-06 DIAGNOSIS — M1990 Unspecified osteoarthritis, unspecified site: Secondary | ICD-10-CM | POA: Diagnosis not present

## 2012-08-06 DIAGNOSIS — E785 Hyperlipidemia, unspecified: Secondary | ICD-10-CM | POA: Diagnosis not present

## 2012-08-06 DIAGNOSIS — E119 Type 2 diabetes mellitus without complications: Secondary | ICD-10-CM | POA: Diagnosis not present

## 2012-08-12 DIAGNOSIS — E119 Type 2 diabetes mellitus without complications: Secondary | ICD-10-CM | POA: Diagnosis not present

## 2012-09-02 DIAGNOSIS — H43819 Vitreous degeneration, unspecified eye: Secondary | ICD-10-CM | POA: Diagnosis not present

## 2012-09-02 DIAGNOSIS — H3581 Retinal edema: Secondary | ICD-10-CM | POA: Diagnosis not present

## 2012-09-02 DIAGNOSIS — H353290 Exudative age-related macular degeneration, unspecified eye, stage unspecified: Secondary | ICD-10-CM | POA: Diagnosis not present

## 2012-09-02 DIAGNOSIS — H353190 Nonexudative age-related macular degeneration, unspecified eye, stage unspecified: Secondary | ICD-10-CM | POA: Diagnosis not present

## 2012-09-08 DIAGNOSIS — G4733 Obstructive sleep apnea (adult) (pediatric): Secondary | ICD-10-CM | POA: Diagnosis not present

## 2012-09-08 DIAGNOSIS — G459 Transient cerebral ischemic attack, unspecified: Secondary | ICD-10-CM | POA: Diagnosis not present

## 2012-09-08 DIAGNOSIS — E1142 Type 2 diabetes mellitus with diabetic polyneuropathy: Secondary | ICD-10-CM | POA: Diagnosis not present

## 2012-09-14 DIAGNOSIS — I6529 Occlusion and stenosis of unspecified carotid artery: Secondary | ICD-10-CM | POA: Diagnosis not present

## 2012-09-16 DIAGNOSIS — M7710 Lateral epicondylitis, unspecified elbow: Secondary | ICD-10-CM | POA: Diagnosis not present

## 2012-09-16 DIAGNOSIS — M7700 Medial epicondylitis, unspecified elbow: Secondary | ICD-10-CM | POA: Diagnosis not present

## 2012-09-16 DIAGNOSIS — M2550 Pain in unspecified joint: Secondary | ICD-10-CM | POA: Diagnosis not present

## 2012-09-16 DIAGNOSIS — M1710 Unilateral primary osteoarthritis, unspecified knee: Secondary | ICD-10-CM | POA: Diagnosis not present

## 2012-09-16 DIAGNOSIS — M949 Disorder of cartilage, unspecified: Secondary | ICD-10-CM | POA: Diagnosis not present

## 2012-09-16 DIAGNOSIS — M25569 Pain in unspecified knee: Secondary | ICD-10-CM | POA: Diagnosis not present

## 2012-09-16 DIAGNOSIS — M899 Disorder of bone, unspecified: Secondary | ICD-10-CM | POA: Diagnosis not present

## 2012-09-16 DIAGNOSIS — M25529 Pain in unspecified elbow: Secondary | ICD-10-CM | POA: Diagnosis not present

## 2012-09-16 DIAGNOSIS — M109 Gout, unspecified: Secondary | ICD-10-CM | POA: Diagnosis not present

## 2012-09-22 DIAGNOSIS — E1159 Type 2 diabetes mellitus with other circulatory complications: Secondary | ICD-10-CM | POA: Diagnosis not present

## 2012-09-24 DIAGNOSIS — M899 Disorder of bone, unspecified: Secondary | ICD-10-CM | POA: Diagnosis not present

## 2012-09-24 DIAGNOSIS — M949 Disorder of cartilage, unspecified: Secondary | ICD-10-CM | POA: Diagnosis not present

## 2012-10-05 DIAGNOSIS — C50919 Malignant neoplasm of unspecified site of unspecified female breast: Secondary | ICD-10-CM | POA: Diagnosis not present

## 2012-10-07 DIAGNOSIS — H3581 Retinal edema: Secondary | ICD-10-CM | POA: Diagnosis not present

## 2012-10-07 DIAGNOSIS — H43819 Vitreous degeneration, unspecified eye: Secondary | ICD-10-CM | POA: Diagnosis not present

## 2012-10-07 DIAGNOSIS — H353290 Exudative age-related macular degeneration, unspecified eye, stage unspecified: Secondary | ICD-10-CM | POA: Diagnosis not present

## 2012-10-07 DIAGNOSIS — H353190 Nonexudative age-related macular degeneration, unspecified eye, stage unspecified: Secondary | ICD-10-CM | POA: Diagnosis not present

## 2012-11-06 DIAGNOSIS — H3581 Retinal edema: Secondary | ICD-10-CM | POA: Diagnosis not present

## 2012-11-06 DIAGNOSIS — H353290 Exudative age-related macular degeneration, unspecified eye, stage unspecified: Secondary | ICD-10-CM | POA: Diagnosis not present

## 2012-11-06 DIAGNOSIS — H43819 Vitreous degeneration, unspecified eye: Secondary | ICD-10-CM | POA: Diagnosis not present

## 2012-11-06 DIAGNOSIS — H353190 Nonexudative age-related macular degeneration, unspecified eye, stage unspecified: Secondary | ICD-10-CM | POA: Diagnosis not present

## 2012-11-10 DIAGNOSIS — E213 Hyperparathyroidism, unspecified: Secondary | ICD-10-CM | POA: Diagnosis not present

## 2012-11-10 DIAGNOSIS — D649 Anemia, unspecified: Secondary | ICD-10-CM | POA: Diagnosis not present

## 2012-11-10 DIAGNOSIS — N189 Chronic kidney disease, unspecified: Secondary | ICD-10-CM | POA: Diagnosis not present

## 2012-11-10 DIAGNOSIS — C50919 Malignant neoplasm of unspecified site of unspecified female breast: Secondary | ICD-10-CM | POA: Diagnosis not present

## 2012-11-15 DIAGNOSIS — H4011X0 Primary open-angle glaucoma, stage unspecified: Secondary | ICD-10-CM | POA: Diagnosis not present

## 2012-11-15 DIAGNOSIS — H3530 Unspecified macular degeneration: Secondary | ICD-10-CM | POA: Diagnosis not present

## 2012-11-15 DIAGNOSIS — E119 Type 2 diabetes mellitus without complications: Secondary | ICD-10-CM | POA: Diagnosis not present

## 2012-11-15 DIAGNOSIS — H18599 Other hereditary corneal dystrophies, unspecified eye: Secondary | ICD-10-CM | POA: Diagnosis not present

## 2012-11-15 DIAGNOSIS — H409 Unspecified glaucoma: Secondary | ICD-10-CM | POA: Diagnosis not present

## 2012-11-22 DIAGNOSIS — R062 Wheezing: Secondary | ICD-10-CM | POA: Diagnosis not present

## 2012-11-22 DIAGNOSIS — C50919 Malignant neoplasm of unspecified site of unspecified female breast: Secondary | ICD-10-CM | POA: Diagnosis not present

## 2012-11-22 DIAGNOSIS — R05 Cough: Secondary | ICD-10-CM | POA: Diagnosis not present

## 2012-11-25 DIAGNOSIS — Z1389 Encounter for screening for other disorder: Secondary | ICD-10-CM | POA: Diagnosis not present

## 2012-11-25 DIAGNOSIS — I150 Renovascular hypertension: Secondary | ICD-10-CM | POA: Diagnosis not present

## 2012-11-25 DIAGNOSIS — E213 Hyperparathyroidism, unspecified: Secondary | ICD-10-CM | POA: Diagnosis not present

## 2012-11-25 DIAGNOSIS — N183 Chronic kidney disease, stage 3 unspecified: Secondary | ICD-10-CM | POA: Diagnosis not present

## 2012-11-25 DIAGNOSIS — I701 Atherosclerosis of renal artery: Secondary | ICD-10-CM | POA: Diagnosis not present

## 2012-11-29 DIAGNOSIS — Z23 Encounter for immunization: Secondary | ICD-10-CM | POA: Diagnosis not present

## 2012-12-21 ENCOUNTER — Other Ambulatory Visit (INDEPENDENT_AMBULATORY_CARE_PROVIDER_SITE_OTHER): Payer: Medicare Other

## 2012-12-21 DIAGNOSIS — E559 Vitamin D deficiency, unspecified: Secondary | ICD-10-CM | POA: Diagnosis not present

## 2012-12-21 DIAGNOSIS — E119 Type 2 diabetes mellitus without complications: Secondary | ICD-10-CM

## 2012-12-21 LAB — CREATININE, SERUM: Creatinine, Ser: 1.5 mg/dL — ABNORMAL HIGH (ref 0.4–1.2)

## 2012-12-21 LAB — PHOSPHORUS: Phosphorus: 3.4 mg/dL (ref 2.3–4.6)

## 2012-12-21 NOTE — Addendum Note (Signed)
Addended by: Bonnye Fava on: 12/21/2012 11:39 AM   Modules accepted: Orders

## 2012-12-22 LAB — PTH, INTACT AND CALCIUM
Calcium: 8.8 mg/dL (ref 8.4–10.5)
PTH: 127.6 pg/mL — ABNORMAL HIGH (ref 14.0–72.0)

## 2012-12-22 LAB — CALCIUM, IONIZED: Calcium, Ion: 1.25 mmol/L (ref 1.12–1.32)

## 2012-12-22 LAB — CREATININE, SERUM: Creat: 1.49 mg/dL — ABNORMAL HIGH (ref 0.50–1.10)

## 2012-12-22 LAB — ESTIMATED GFR
GFR, Est African American: 35 mL/min — ABNORMAL LOW
GFR, Est Non African American: 30 mL/min — ABNORMAL LOW

## 2012-12-23 LAB — VITAMIN D 25 HYDROXY (VIT D DEFICIENCY, FRACTURES): Vit D, 25-Hydroxy: 59 ng/mL (ref 30–89)

## 2012-12-31 DIAGNOSIS — H353290 Exudative age-related macular degeneration, unspecified eye, stage unspecified: Secondary | ICD-10-CM | POA: Diagnosis not present

## 2012-12-31 DIAGNOSIS — Z961 Presence of intraocular lens: Secondary | ICD-10-CM | POA: Diagnosis not present

## 2012-12-31 DIAGNOSIS — H18599 Other hereditary corneal dystrophies, unspecified eye: Secondary | ICD-10-CM | POA: Diagnosis not present

## 2013-02-01 DIAGNOSIS — H353290 Exudative age-related macular degeneration, unspecified eye, stage unspecified: Secondary | ICD-10-CM | POA: Diagnosis not present

## 2013-02-01 DIAGNOSIS — H409 Unspecified glaucoma: Secondary | ICD-10-CM | POA: Diagnosis not present

## 2013-02-01 DIAGNOSIS — H18899 Other specified disorders of cornea, unspecified eye: Secondary | ICD-10-CM | POA: Diagnosis not present

## 2013-02-01 DIAGNOSIS — Z961 Presence of intraocular lens: Secondary | ICD-10-CM | POA: Diagnosis not present

## 2013-02-01 DIAGNOSIS — H4011X0 Primary open-angle glaucoma, stage unspecified: Secondary | ICD-10-CM | POA: Diagnosis not present

## 2013-02-08 DIAGNOSIS — H02059 Trichiasis without entropian unspecified eye, unspecified eyelid: Secondary | ICD-10-CM | POA: Diagnosis not present

## 2013-02-08 DIAGNOSIS — H18899 Other specified disorders of cornea, unspecified eye: Secondary | ICD-10-CM | POA: Diagnosis not present

## 2013-02-08 DIAGNOSIS — H409 Unspecified glaucoma: Secondary | ICD-10-CM | POA: Diagnosis not present

## 2013-02-08 DIAGNOSIS — H4011X0 Primary open-angle glaucoma, stage unspecified: Secondary | ICD-10-CM | POA: Diagnosis not present

## 2013-02-08 DIAGNOSIS — H353290 Exudative age-related macular degeneration, unspecified eye, stage unspecified: Secondary | ICD-10-CM | POA: Diagnosis not present

## 2013-02-08 DIAGNOSIS — Z961 Presence of intraocular lens: Secondary | ICD-10-CM | POA: Diagnosis not present

## 2013-03-15 ENCOUNTER — Other Ambulatory Visit (INDEPENDENT_AMBULATORY_CARE_PROVIDER_SITE_OTHER): Payer: Medicare Other

## 2013-03-15 DIAGNOSIS — E039 Hypothyroidism, unspecified: Secondary | ICD-10-CM | POA: Diagnosis not present

## 2013-03-15 LAB — TSH: TSH: 3.71 u[IU]/mL (ref 0.35–5.50)

## 2013-03-31 IMAGING — NM NM PULMONARY VENT & PERF
2 series · 12 of 12 positions shown · non-contrast
Comparison: Chest radiograph 7817 hours the same day.

CLINICAL DATA: 89-year-old female with pain radiating to the left
shoulder and right chest.  History of DVT 40 years ago.

NUCLEAR MEDICINE VENTILATION - PERFUSION LUNG SCAN
TECHNIQUE: Wash-in, equilibrium, and wash-out phase ventilation
images were obtained using Ne-422 gas.  Perfusion images were
obtained in multiple projections after intravenous injection of Tc-
99m MAA.
Radiopharmaceuticals:  10.5 mCi Ne-422 gas and 6.6 mCi Yc-00m MAA.

[vq scan · 2.52mm/px · 6 of 20 frames shown (1 of 2)]
[frame 2/20  full-range]
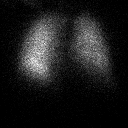
[frame 5/20  full-range]
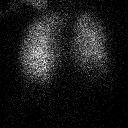
[frame 9/20  full-range]
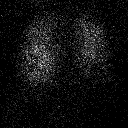
[frame 12/20  full-range]
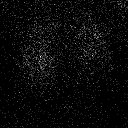
[frame 15/20  full-range]
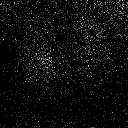
[frame 19/20  full-range]
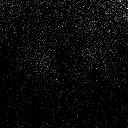

[vq scan · 2.52mm/px · 6 of 20 frames shown (2 of 2)]
[frame 2/20  full-range]
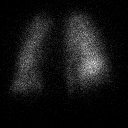
[frame 5/20  full-range]
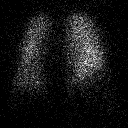
[frame 9/20  full-range]
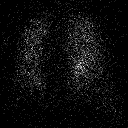
[frame 12/20]
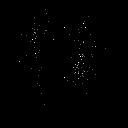
[frame 15/20]
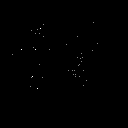
[frame 19/20]
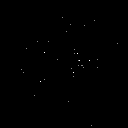

[12 of 12 positions shown; findings below may reference images not displayed]

FINDINGS: Comparison chest radiograph demonstrates cardiomegaly and
elevation of the right hemidiaphragm.

Ventilation imaging demonstrates generalized mild to moderately
diminished ventilation in the left lung compared to the right.  No
focal ventilation defects.

Perfusion imaging demonstrates photopenia related to cardiomegaly
and hilar structures.  No perfusion defect.
IMPRESSION: Low probability of acute pulmonary embolus.

## 2013-04-01 DIAGNOSIS — H35369 Drusen (degenerative) of macula, unspecified eye: Secondary | ICD-10-CM | POA: Diagnosis not present

## 2013-04-01 DIAGNOSIS — H353290 Exudative age-related macular degeneration, unspecified eye, stage unspecified: Secondary | ICD-10-CM | POA: Diagnosis not present

## 2013-04-21 DIAGNOSIS — L608 Other nail disorders: Secondary | ICD-10-CM | POA: Diagnosis not present

## 2013-04-21 DIAGNOSIS — L84 Corns and callosities: Secondary | ICD-10-CM | POA: Diagnosis not present

## 2013-04-21 DIAGNOSIS — I739 Peripheral vascular disease, unspecified: Secondary | ICD-10-CM | POA: Diagnosis not present

## 2013-04-21 DIAGNOSIS — E1159 Type 2 diabetes mellitus with other circulatory complications: Secondary | ICD-10-CM | POA: Diagnosis not present

## 2013-04-25 ENCOUNTER — Ambulatory Visit (INDEPENDENT_AMBULATORY_CARE_PROVIDER_SITE_OTHER): Payer: Medicare Other | Admitting: Family Medicine

## 2013-04-25 ENCOUNTER — Encounter: Payer: Self-pay | Admitting: Family Medicine

## 2013-04-25 VITALS — BP 110/62 | Temp 98.0°F | Wt 204.0 lb

## 2013-04-25 DIAGNOSIS — N183 Chronic kidney disease, stage 3 unspecified: Secondary | ICD-10-CM | POA: Diagnosis not present

## 2013-04-25 DIAGNOSIS — D649 Anemia, unspecified: Secondary | ICD-10-CM

## 2013-04-25 DIAGNOSIS — E079 Disorder of thyroid, unspecified: Secondary | ICD-10-CM | POA: Diagnosis not present

## 2013-04-25 DIAGNOSIS — R0602 Shortness of breath: Secondary | ICD-10-CM

## 2013-04-25 DIAGNOSIS — I10 Essential (primary) hypertension: Secondary | ICD-10-CM

## 2013-04-25 DIAGNOSIS — E876 Hypokalemia: Secondary | ICD-10-CM

## 2013-04-25 DIAGNOSIS — E119 Type 2 diabetes mellitus without complications: Secondary | ICD-10-CM

## 2013-04-25 LAB — BASIC METABOLIC PANEL
BUN: 33 mg/dL — ABNORMAL HIGH (ref 6–23)
CO2: 25 meq/L (ref 19–32)
Calcium: 8.7 mg/dL (ref 8.4–10.5)
Chloride: 111 mEq/L (ref 96–112)
Creatinine, Ser: 1.4 mg/dL — ABNORMAL HIGH (ref 0.4–1.2)
GFR: 36.49 mL/min — AB (ref >60.00)
GLUCOSE: 92 mg/dL (ref 70–99)
POTASSIUM: 4 meq/L (ref 3.5–5.1)
SODIUM: 143 meq/L (ref 135–145)

## 2013-04-25 LAB — CBC WITH DIFFERENTIAL/PLATELET
BASOS PCT: 0.3 % (ref 0.0–3.0)
Basophils Absolute: 0 10*3/uL (ref 0.0–0.1)
Eosinophils Absolute: 0.2 10*3/uL (ref 0.0–0.7)
Eosinophils Relative: 2.6 % (ref 0.0–5.0)
HCT: 31.8 % — ABNORMAL LOW (ref 36.0–46.0)
HEMOGLOBIN: 10.1 g/dL — AB (ref 12.0–15.0)
LYMPHS PCT: 9.9 % — AB (ref 12.0–46.0)
Lymphs Abs: 0.9 10*3/uL (ref 0.7–4.0)
MCHC: 31.9 g/dL (ref 30.0–36.0)
MCV: 77.2 fl — ABNORMAL LOW (ref 78.0–100.0)
MONOS PCT: 9.4 % (ref 3.0–12.0)
Monocytes Absolute: 0.8 10*3/uL (ref 0.1–1.0)
NEUTROS ABS: 7 10*3/uL (ref 1.4–7.7)
Neutrophils Relative %: 77.8 % — ABNORMAL HIGH (ref 43.0–77.0)
Platelets: 322 10*3/uL (ref 150.0–400.0)
RBC: 4.12 Mil/uL (ref 3.87–5.11)
RDW: 16.8 % — ABNORMAL HIGH (ref 11.5–14.6)
WBC: 9 10*3/uL (ref 4.5–10.5)

## 2013-04-25 LAB — HEMOGLOBIN A1C: Hgb A1c MFr Bld: 6.2 % (ref 4.6–6.5)

## 2013-04-25 LAB — BRAIN NATRIURETIC PEPTIDE: Pro B Natriuretic peptide (BNP): 159 pg/mL — ABNORMAL HIGH (ref 0.0–100.0)

## 2013-04-25 LAB — TSH: TSH: 2.08 u[IU]/mL (ref 0.35–5.50)

## 2013-04-25 NOTE — Patient Instructions (Signed)
Continue your current medications  Labs today  We'll call you the report

## 2013-04-25 NOTE — Progress Notes (Signed)
   Subjective:    Patient ID: Amanda Cunningham, female    DOB: [birth year], 80 y.o.   MRN: [id number]  HPI Amanda Cunningham is a 80 year old female married nonsmoker who comes in today for evaluation of multiple issues  She has underlying hypertension BP was 110/60.  She has underlying diabetes random blood sugar 116.  She says she is tired and has no energy  Also complaining of shortness of breath with minimal exertion. She denies any chest pain PND orthopnea etc. Weight is steady 204 pounds    Review of Systems    review of systems otherwise negative Objective:   Physical Exam Well-developed well-nourished female no acute distress vital signs stable she is afebrile pulmonary exam normal cardiac exam shows a grade 3/6 systolic ejection murmur consistent with aortic stenosis       Assessment & Plan:  Diabetes probably at goal check labs  Hypertension at goal continue current therapy  Fatigue check labs  Shortness of breath probably related to aortic stenosis......... limited options patient declines any surgical consultation which I agree with

## 2013-04-25 NOTE — Progress Notes (Signed)
Pre visit review using our clinic review tool, if applicable. No additional management support is needed unless otherwise documented below in the visit note. 

## 2013-04-26 ENCOUNTER — Telehealth: Payer: Self-pay | Admitting: Family Medicine

## 2013-04-26 NOTE — Telephone Encounter (Signed)
Relevant patient education mailed to patient.  

## 2013-05-05 ENCOUNTER — Ambulatory Visit (INDEPENDENT_AMBULATORY_CARE_PROVIDER_SITE_OTHER): Payer: Medicare Other | Admitting: Family Medicine

## 2013-05-05 ENCOUNTER — Encounter: Payer: Self-pay | Admitting: Family Medicine

## 2013-05-05 DIAGNOSIS — D649 Anemia, unspecified: Secondary | ICD-10-CM | POA: Diagnosis not present

## 2013-05-05 DIAGNOSIS — R0602 Shortness of breath: Secondary | ICD-10-CM | POA: Diagnosis not present

## 2013-05-05 DIAGNOSIS — E119 Type 2 diabetes mellitus without complications: Secondary | ICD-10-CM

## 2013-05-05 LAB — IRON: IRON: 27 ug/dL — AB (ref 42–145)

## 2013-05-05 LAB — FERRITIN: FERRITIN: 30 ng/mL (ref 10.0–291.0)

## 2013-05-05 LAB — VITAMIN B12: VITAMIN B 12: 379 pg/mL (ref 211–911)

## 2013-05-05 NOTE — Progress Notes (Signed)
   Subjective:    Patient ID: Amanda Cunningham, female    DOB: 08-Feb-1920, 80 y.o.   MRN: [id number]  HPI Amanda Cunningham is a 80 year old married female nonsmoker,,,,,,,, this going back to her hometown of [place] in May where she seen at the [facility] clinic,,,,, who comes in today for discussion of her abnormal labs  Her blood pressures under good control with her medication.  Blood sugar under good control hemoglobin A1c 6.2%  Thyroid level normal  Metabolic panel shows a GFR of 37 creatinine 1.4 BUN is 33. She had stents inserted and both renal arteries around 2010 at the [facility] clinic because of renal artery stenosis. Followup ultrasound a year ago was normal the stents are still patent.  She was told up there that she was anemic. She was given a transfusion and that oral iron. She had a complete diagnostic workup that showed no evidence of any bleeding. Her hemoglobin now is 10.1.  Her BNP is slightly elevated 159 and she is slightly short of breath.   Review of Systems    review of systems otherwise negative Objective:   Physical Exam Well-developed well-nourished female no acute distress vital signs stable she is afebrile       Assessment & Plan:  Anemia,,,, check iron and B12 levels  Chronic renal insufficiency  Mild congestive heart failure  Hypertension at goal  Diabetes ago ,,,,,,,

## 2013-05-05 NOTE — Progress Notes (Signed)
Pre visit review using our clinic review tool, if applicable. No additional management support is needed unless otherwise documented below in the visit note. 

## 2013-05-05 NOTE — Patient Instructions (Signed)
Continue your current medication  I will call you I gets her lab work back

## 2013-05-25 ENCOUNTER — Telehealth: Payer: Self-pay | Admitting: Family Medicine

## 2013-05-25 ENCOUNTER — Encounter: Payer: Self-pay | Admitting: Family Medicine

## 2013-05-25 ENCOUNTER — Ambulatory Visit (INDEPENDENT_AMBULATORY_CARE_PROVIDER_SITE_OTHER): Payer: Medicare Other | Admitting: Family Medicine

## 2013-05-25 VITALS — BP 140/80 | HR 68 | Temp 97.7°F

## 2013-05-25 DIAGNOSIS — M129 Arthropathy, unspecified: Secondary | ICD-10-CM | POA: Diagnosis not present

## 2013-05-25 DIAGNOSIS — M1990 Unspecified osteoarthritis, unspecified site: Secondary | ICD-10-CM

## 2013-05-25 MED ORDER — PREDNISONE 20 MG PO TABS: ORAL_TABLET | ORAL | Status: DC

## 2013-05-25 NOTE — Progress Notes (Signed)
   Subjective:    Patient ID: Amanda Cunningham, female    DOB: 1919-07-04, 80 y.o.   MRN: [id number]  HPI Amanda Cunningham is a 80 year old female who comes in today for evaluation of pain and swelling of her left ring finger  She notices during the middle the night and she can get her ring soft. No history of trauma. There is no swelling of any other joint. She states in the past she's had gout. It's not red or painful extra swollen.  I was unable to remove her rings therefore sent her to a jury store. They cut her rings off.    Review of Systems    review of systems otherwise negative except for history of gout Objective:   Physical Exam  Well-developed well-nourished female no acute distress vital signs stable she is afebrile examination of the ring finger shows marked swelling. Rings removed at the jury store. No heat trauma evident      Assessment & Plan:  Acute swelling of left ring finger plan prednisone burst and taper

## 2013-05-25 NOTE — Patient Instructions (Signed)
Toluwanimi...............Marland Kitchen prednisone 20 mg......Marland Kitchen 1 tab x5 days, a half a tab x5 days, then stop    Elenore Rota...................Marland Kitchen verapamil 120 mg........ one tablet daily in the morning  Followup blood pressure check in one week

## 2013-05-26 ENCOUNTER — Ambulatory Visit: Payer: Medicare Other | Admitting: Family Medicine

## 2013-06-02 ENCOUNTER — Telehealth: Payer: Self-pay

## 2013-06-02 NOTE — Telephone Encounter (Signed)
Relevant patient education mailed to patient.  

## 2013-06-14 ENCOUNTER — Encounter (HOSPITAL_COMMUNITY): Payer: Self-pay | Admitting: Emergency Medicine

## 2013-06-14 ENCOUNTER — Inpatient Hospital Stay (HOSPITAL_COMMUNITY)
Admission: EM | Admit: 2013-06-14 | Discharge: 2013-06-17 | DRG: 202 | Disposition: A | Discharge status: Home / Self Care | Payer: Medicare Other | Attending: Internal Medicine | Admitting: Internal Medicine

## 2013-06-14 ENCOUNTER — Emergency Department (HOSPITAL_COMMUNITY): Payer: Medicare Other

## 2013-06-14 DIAGNOSIS — E860 Dehydration: Secondary | ICD-10-CM

## 2013-06-14 DIAGNOSIS — R000 Tachycardia, unspecified: Secondary | ICD-10-CM | POA: Diagnosis present

## 2013-06-14 DIAGNOSIS — N183 Chronic kidney disease, stage 3 unspecified: Secondary | ICD-10-CM | POA: Diagnosis present

## 2013-06-14 DIAGNOSIS — Z8249 Family history of ischemic heart disease and other diseases of the circulatory system: Secondary | ICD-10-CM | POA: Diagnosis not present

## 2013-06-14 DIAGNOSIS — I129 Hypertensive chronic kidney disease with stage 1 through stage 4 chronic kidney disease, or unspecified chronic kidney disease: Secondary | ICD-10-CM | POA: Diagnosis present

## 2013-06-14 DIAGNOSIS — J218 Acute bronchiolitis due to other specified organisms: Principal | ICD-10-CM | POA: Diagnosis present

## 2013-06-14 DIAGNOSIS — I739 Peripheral vascular disease, unspecified: Secondary | ICD-10-CM | POA: Diagnosis present

## 2013-06-14 DIAGNOSIS — J209 Acute bronchitis, unspecified: Secondary | ICD-10-CM | POA: Diagnosis present

## 2013-06-14 DIAGNOSIS — G589 Mononeuropathy, unspecified: Secondary | ICD-10-CM | POA: Diagnosis present

## 2013-06-14 DIAGNOSIS — E669 Obesity, unspecified: Secondary | ICD-10-CM | POA: Diagnosis present

## 2013-06-14 DIAGNOSIS — M7989 Other specified soft tissue disorders: Secondary | ICD-10-CM | POA: Diagnosis present

## 2013-06-14 DIAGNOSIS — E119 Type 2 diabetes mellitus without complications: Secondary | ICD-10-CM | POA: Diagnosis present

## 2013-06-14 DIAGNOSIS — J9819 Other pulmonary collapse: Secondary | ICD-10-CM | POA: Diagnosis not present

## 2013-06-14 DIAGNOSIS — R0602 Shortness of breath: Secondary | ICD-10-CM | POA: Diagnosis not present

## 2013-06-14 DIAGNOSIS — Z6834 Body mass index (BMI) 34.0-34.9, adult: Secondary | ICD-10-CM

## 2013-06-14 DIAGNOSIS — Z7982 Long term (current) use of aspirin: Secondary | ICD-10-CM

## 2013-06-14 DIAGNOSIS — J189 Pneumonia, unspecified organism: Secondary | ICD-10-CM

## 2013-06-14 DIAGNOSIS — I5031 Acute diastolic (congestive) heart failure: Secondary | ICD-10-CM

## 2013-06-14 DIAGNOSIS — N179 Acute kidney failure, unspecified: Secondary | ICD-10-CM | POA: Diagnosis not present

## 2013-06-14 DIAGNOSIS — M129 Arthropathy, unspecified: Secondary | ICD-10-CM | POA: Diagnosis present

## 2013-06-14 DIAGNOSIS — R0902 Hypoxemia: Secondary | ICD-10-CM | POA: Diagnosis present

## 2013-06-14 DIAGNOSIS — I5033 Acute on chronic diastolic (congestive) heart failure: Secondary | ICD-10-CM | POA: Diagnosis present

## 2013-06-14 DIAGNOSIS — E039 Hypothyroidism, unspecified: Secondary | ICD-10-CM | POA: Diagnosis present

## 2013-06-14 DIAGNOSIS — Z96649 Presence of unspecified artificial hip joint: Secondary | ICD-10-CM

## 2013-06-14 DIAGNOSIS — D649 Anemia, unspecified: Secondary | ICD-10-CM

## 2013-06-14 DIAGNOSIS — M1990 Unspecified osteoarthritis, unspecified site: Secondary | ICD-10-CM

## 2013-06-14 DIAGNOSIS — C50919 Malignant neoplasm of unspecified site of unspecified female breast: Secondary | ICD-10-CM

## 2013-06-14 DIAGNOSIS — M79609 Pain in unspecified limb: Secondary | ICD-10-CM | POA: Diagnosis present

## 2013-06-14 DIAGNOSIS — Z96659 Presence of unspecified artificial knee joint: Secondary | ICD-10-CM | POA: Diagnosis not present

## 2013-06-14 DIAGNOSIS — D72829 Elevated white blood cell count, unspecified: Secondary | ICD-10-CM

## 2013-06-14 DIAGNOSIS — I10 Essential (primary) hypertension: Secondary | ICD-10-CM | POA: Diagnosis not present

## 2013-06-14 DIAGNOSIS — G8929 Other chronic pain: Secondary | ICD-10-CM | POA: Diagnosis present

## 2013-06-14 DIAGNOSIS — H409 Unspecified glaucoma: Secondary | ICD-10-CM | POA: Diagnosis present

## 2013-06-14 DIAGNOSIS — Z853 Personal history of malignant neoplasm of breast: Secondary | ICD-10-CM

## 2013-06-14 DIAGNOSIS — R0682 Tachypnea, not elsewhere classified: Secondary | ICD-10-CM | POA: Diagnosis present

## 2013-06-14 DIAGNOSIS — E876 Hypokalemia: Secondary | ICD-10-CM | POA: Diagnosis present

## 2013-06-14 DIAGNOSIS — E079 Disorder of thyroid, unspecified: Secondary | ICD-10-CM | POA: Diagnosis present

## 2013-06-14 DIAGNOSIS — Z794 Long term (current) use of insulin: Secondary | ICD-10-CM

## 2013-06-14 DIAGNOSIS — Z7902 Long term (current) use of antithrombotics/antiplatelets: Secondary | ICD-10-CM

## 2013-06-14 DIAGNOSIS — G629 Polyneuropathy, unspecified: Secondary | ICD-10-CM | POA: Diagnosis present

## 2013-06-14 DIAGNOSIS — Z888 Allergy status to other drugs, medicaments and biological substances status: Secondary | ICD-10-CM

## 2013-06-14 DIAGNOSIS — K56609 Unspecified intestinal obstruction, unspecified as to partial versus complete obstruction: Secondary | ICD-10-CM

## 2013-06-14 DIAGNOSIS — Z79899 Other long term (current) drug therapy: Secondary | ICD-10-CM

## 2013-06-14 DIAGNOSIS — I509 Heart failure, unspecified: Secondary | ICD-10-CM | POA: Diagnosis present

## 2013-06-14 DIAGNOSIS — J219 Acute bronchiolitis, unspecified: Secondary | ICD-10-CM

## 2013-06-14 DIAGNOSIS — R112 Nausea with vomiting, unspecified: Secondary | ICD-10-CM | POA: Diagnosis not present

## 2013-06-14 LAB — CBC WITH DIFFERENTIAL/PLATELET
BASOS ABS: 0 10*3/uL (ref 0.0–0.1)
BASOS PCT: 0 % (ref 0–1)
Eosinophils Absolute: 0 10*3/uL (ref 0.0–0.7)
Eosinophils Relative: 0 % (ref 0–5)
HCT: 29.4 % — ABNORMAL LOW (ref 36.0–46.0)
HEMOGLOBIN: 9.2 g/dL — AB (ref 12.0–15.0)
LYMPHS PCT: 2 % — AB (ref 12–46)
Lymphs Abs: 0.4 10*3/uL — ABNORMAL LOW (ref 0.7–4.0)
MCH: 24.4 pg — AB (ref 26.0–34.0)
MCHC: 31.3 g/dL (ref 30.0–36.0)
MCV: 78 fL (ref 78.0–100.0)
MONO ABS: 1.2 10*3/uL — AB (ref 0.1–1.0)
Monocytes Relative: 6 % (ref 3–12)
Neutro Abs: 18 10*3/uL — ABNORMAL HIGH (ref 1.7–7.7)
Neutrophils Relative %: 92 % — ABNORMAL HIGH (ref 43–77)
PLATELETS: 279 10*3/uL (ref 150–400)
RBC: 3.77 MIL/uL — ABNORMAL LOW (ref 3.87–5.11)
RDW: 16.8 % — AB (ref 11.5–15.5)
WBC: 19.6 10*3/uL — ABNORMAL HIGH (ref 4.0–10.5)

## 2013-06-14 LAB — COMPREHENSIVE METABOLIC PANEL
ALBUMIN: 2.7 g/dL — AB (ref 3.5–5.2)
ALK PHOS: 133 U/L — AB (ref 39–117)
ALT: 24 U/L (ref 0–35)
AST: 23 U/L (ref 0–37)
BUN: 29 mg/dL — ABNORMAL HIGH (ref 6–23)
CALCIUM: 8.2 mg/dL — AB (ref 8.4–10.5)
CO2: 20 mEq/L (ref 19–32)
Chloride: 102 mEq/L (ref 96–112)
Creatinine, Ser: 1.37 mg/dL — ABNORMAL HIGH (ref 0.50–1.10)
GFR calc Af Amer: 38 mL/min — ABNORMAL LOW (ref >90)
GFR calc non Af Amer: 33 mL/min — ABNORMAL LOW (ref >90)
GLUCOSE: 170 mg/dL — AB (ref 70–99)
POTASSIUM: 3.4 meq/L — AB (ref 3.7–5.3)
SODIUM: 139 meq/L (ref 137–147)
TOTAL PROTEIN: 6.3 g/dL (ref 6.0–8.3)
Total Bilirubin: 0.4 mg/dL (ref 0.3–1.2)

## 2013-06-14 LAB — I-STAT CG4 LACTIC ACID, ED: Lactic Acid, Venous: 0.69 mmol/L (ref 0.5–2.2)

## 2013-06-14 LAB — LIPASE, BLOOD: Lipase: 31 U/L (ref 11–59)

## 2013-06-14 LAB — PRO B NATRIURETIC PEPTIDE: Pro B Natriuretic peptide (BNP): 1982 pg/mL — ABNORMAL HIGH (ref 0–450)

## 2013-06-14 MED ORDER — ACETAMINOPHEN 325 MG PO TABS
650.0000 mg | ORAL_TABLET | Freq: Once | ORAL | Status: AC
  Administered 2013-06-14: 650 mg via ORAL
  Filled 2013-06-14: qty 2

## 2013-06-14 MED ORDER — ONDANSETRON HCL 4 MG/2ML IJ SOLN
4.0000 mg | Freq: Once | INTRAMUSCULAR | Status: AC
  Administered 2013-06-14: 4 mg via INTRAVENOUS
  Filled 2013-06-14: qty 2

## 2013-06-14 NOTE — ED Notes (Signed)
Per patient-nausea, vomiting and fever starting today. Tmax at home was 101. Trectal at this time is 102.6. Abdominal pain reported but denies radiation. Pt does report sputum-green and yellow with a cough. MD and family at bedside.

## 2013-06-14 NOTE — ED Notes (Signed)
Per EMS- called for fever and vomiting. Pt reports ankle swelling especially laying flat. Pt saturation 91% on RA. EMS placed on O2 mask at 10L high flow. Hx CHF.VSS 180/83 O2 saturations 98% HR 104 CBG 137. Patient cannot be stuck or have BP taken on left arm. Hx lumpectomy.

## 2013-06-14 NOTE — ED Notes (Signed)
Need for urine is noted.

## 2013-06-14 NOTE — ED Notes (Signed)
Bed: YT03 Expected date:  Expected time:  Means of arrival:  Comments: EMS SHOB, febrile, Afib

## 2013-06-15 ENCOUNTER — Encounter (HOSPITAL_COMMUNITY): Payer: Self-pay | Admitting: *Deleted

## 2013-06-15 DIAGNOSIS — G589 Mononeuropathy, unspecified: Secondary | ICD-10-CM

## 2013-06-15 DIAGNOSIS — D649 Anemia, unspecified: Secondary | ICD-10-CM | POA: Diagnosis not present

## 2013-06-15 DIAGNOSIS — N183 Chronic kidney disease, stage 3 unspecified: Secondary | ICD-10-CM

## 2013-06-15 DIAGNOSIS — J218 Acute bronchiolitis due to other specified organisms: Secondary | ICD-10-CM | POA: Diagnosis not present

## 2013-06-15 DIAGNOSIS — E119 Type 2 diabetes mellitus without complications: Secondary | ICD-10-CM | POA: Diagnosis not present

## 2013-06-15 DIAGNOSIS — R0602 Shortness of breath: Secondary | ICD-10-CM

## 2013-06-15 DIAGNOSIS — I10 Essential (primary) hypertension: Secondary | ICD-10-CM | POA: Diagnosis not present

## 2013-06-15 DIAGNOSIS — J189 Pneumonia, unspecified organism: Secondary | ICD-10-CM | POA: Insufficient documentation

## 2013-06-15 LAB — URINALYSIS, ROUTINE W REFLEX MICROSCOPIC
Bilirubin Urine: NEGATIVE
Glucose, UA: NEGATIVE mg/dL
HGB URINE DIPSTICK: NEGATIVE
KETONES UR: NEGATIVE mg/dL
Leukocytes, UA: NEGATIVE
Nitrite: NEGATIVE
PH: 5 (ref 5.0–8.0)
Protein, ur: >300 mg/dL — AB
SPECIFIC GRAVITY, URINE: 1.016 (ref 1.005–1.030)
Urobilinogen, UA: 0.2 mg/dL (ref 0.0–1.0)

## 2013-06-15 LAB — CBC
HCT: 29.5 % — ABNORMAL LOW (ref 36.0–46.0)
Hemoglobin: 9.2 g/dL — ABNORMAL LOW (ref 12.0–15.0)
MCH: 24.5 pg — AB (ref 26.0–34.0)
MCHC: 31.2 g/dL (ref 30.0–36.0)
MCV: 78.5 fL (ref 78.0–100.0)
PLATELETS: 279 10*3/uL (ref 150–400)
RBC: 3.76 MIL/uL — ABNORMAL LOW (ref 3.87–5.11)
RDW: 16.8 % — AB (ref 11.5–15.5)
WBC: 23.1 10*3/uL — ABNORMAL HIGH (ref 4.0–10.5)

## 2013-06-15 LAB — EXPECTORATED SPUTUM ASSESSMENT W REFEX TO RESP CULTURE

## 2013-06-15 LAB — STREP PNEUMONIAE URINARY ANTIGEN: Strep Pneumo Urinary Antigen: NEGATIVE

## 2013-06-15 LAB — EXPECTORATED SPUTUM ASSESSMENT W GRAM STAIN, RFLX TO RESP C

## 2013-06-15 LAB — PROTIME-INR
INR: 1.17 (ref 0.00–1.49)
PROTHROMBIN TIME: 14.7 s (ref 11.6–15.2)

## 2013-06-15 LAB — COMPREHENSIVE METABOLIC PANEL
ALK PHOS: 128 U/L — AB (ref 39–117)
ALT: 24 U/L (ref 0–35)
AST: 20 U/L (ref 0–37)
Albumin: 2.5 g/dL — ABNORMAL LOW (ref 3.5–5.2)
BILIRUBIN TOTAL: 0.3 mg/dL (ref 0.3–1.2)
BUN: 29 mg/dL — ABNORMAL HIGH (ref 6–23)
CHLORIDE: 104 meq/L (ref 96–112)
CO2: 22 meq/L (ref 19–32)
Calcium: 8.5 mg/dL (ref 8.4–10.5)
Creatinine, Ser: 1.51 mg/dL — ABNORMAL HIGH (ref 0.50–1.10)
GFR calc Af Amer: 34 mL/min — ABNORMAL LOW (ref >90)
GFR, EST NON AFRICAN AMERICAN: 29 mL/min — AB (ref >90)
GLUCOSE: 145 mg/dL — AB (ref 70–99)
Potassium: 3.6 mEq/L — ABNORMAL LOW (ref 3.7–5.3)
SODIUM: 139 meq/L (ref 137–147)
Total Protein: 5.9 g/dL — ABNORMAL LOW (ref 6.0–8.3)

## 2013-06-15 LAB — GLUCOSE, CAPILLARY
GLUCOSE-CAPILLARY: 84 mg/dL (ref 70–99)
Glucose-Capillary: 126 mg/dL — ABNORMAL HIGH (ref 70–99)
Glucose-Capillary: 144 mg/dL — ABNORMAL HIGH (ref 70–99)

## 2013-06-15 LAB — INFLUENZA PANEL BY PCR (TYPE A & B)
H1N1 flu by pcr: NOT DETECTED
Influenza A By PCR: NEGATIVE
Influenza B By PCR: NEGATIVE

## 2013-06-15 LAB — URINE MICROSCOPIC-ADD ON

## 2013-06-15 LAB — TROPONIN I

## 2013-06-15 MED ORDER — GUAIFENESIN ER 600 MG PO TB12
600.0000 mg | ORAL_TABLET | Freq: Two times a day (BID) | ORAL | Status: DC
  Administered 2013-06-15 – 2013-06-17 (×5): 600 mg via ORAL
  Filled 2013-06-15 (×6): qty 1

## 2013-06-15 MED ORDER — CLOPIDOGREL BISULFATE 75 MG PO TABS
75.0000 mg | ORAL_TABLET | Freq: Every day | ORAL | Status: DC
  Administered 2013-06-15 – 2013-06-17 (×3): 75 mg via ORAL
  Filled 2013-06-15 (×3): qty 1

## 2013-06-15 MED ORDER — ALBUTEROL SULFATE (2.5 MG/3ML) 0.083% IN NEBU: 2.5000 mg | INHALATION_SOLUTION | Freq: Four times a day (QID) | RESPIRATORY_TRACT | Status: DC

## 2013-06-15 MED ORDER — FUROSEMIDE 40 MG PO TABS
40.0000 mg | ORAL_TABLET | Freq: Every day | ORAL | Status: DC
  Administered 2013-06-15: 40 mg via ORAL
  Filled 2013-06-15 (×2): qty 1

## 2013-06-15 MED ORDER — LABETALOL HCL 200 MG PO TABS
200.0000 mg | ORAL_TABLET | Freq: Two times a day (BID) | ORAL | Status: DC
  Administered 2013-06-15 – 2013-06-17 (×5): 200 mg via ORAL
  Filled 2013-06-15 (×7): qty 1

## 2013-06-15 MED ORDER — SODIUM CHLORIDE 0.9 % IJ SOLN
3.0000 mL | Freq: Two times a day (BID) | INTRAMUSCULAR | Status: DC
  Administered 2013-06-15 – 2013-06-16 (×3): 3 mL via INTRAVENOUS

## 2013-06-15 MED ORDER — HEPARIN SODIUM (PORCINE) 5000 UNIT/ML IJ SOLN
5000.0000 [IU] | Freq: Three times a day (TID) | INTRAMUSCULAR | Status: DC
  Administered 2013-06-15 – 2013-06-17 (×7): 5000 [IU] via SUBCUTANEOUS
  Filled 2013-06-15 (×10): qty 1

## 2013-06-15 MED ORDER — ONDANSETRON HCL 4 MG/2ML IJ SOLN
4.0000 mg | Freq: Four times a day (QID) | INTRAMUSCULAR | Status: DC | PRN
  Filled 2013-06-15: qty 2

## 2013-06-15 MED ORDER — FERROUS SULFATE 325 (65 FE) MG PO TABS
325.0000 mg | ORAL_TABLET | Freq: Every day | ORAL | Status: DC
  Administered 2013-06-15 – 2013-06-17 (×3): 325 mg via ORAL
  Filled 2013-06-15 (×4): qty 1

## 2013-06-15 MED ORDER — ACETAMINOPHEN 650 MG RE SUPP: 650.0000 mg | Freq: Four times a day (QID) | RECTAL | Status: DC | PRN

## 2013-06-15 MED ORDER — CLONIDINE HCL 0.2 MG PO TABS
0.2000 mg | ORAL_TABLET | Freq: Two times a day (BID) | ORAL | Status: DC
  Administered 2013-06-15 – 2013-06-17 (×5): 0.2 mg via ORAL
  Filled 2013-06-15 (×7): qty 1

## 2013-06-15 MED ORDER — ASPIRIN EC 81 MG PO TBEC
81.0000 mg | DELAYED_RELEASE_TABLET | Freq: Every day | ORAL | Status: DC
  Administered 2013-06-15 – 2013-06-17 (×3): 81 mg via ORAL
  Filled 2013-06-15 (×3): qty 1

## 2013-06-15 MED ORDER — PANTOPRAZOLE SODIUM 40 MG PO TBEC
40.0000 mg | DELAYED_RELEASE_TABLET | Freq: Every day | ORAL | Status: DC
  Administered 2013-06-15 – 2013-06-17 (×3): 40 mg via ORAL
  Filled 2013-06-15 (×3): qty 1

## 2013-06-15 MED ORDER — ACETAMINOPHEN 325 MG PO TABS
650.0000 mg | ORAL_TABLET | Freq: Four times a day (QID) | ORAL | Status: DC | PRN
  Administered 2013-06-16: 650 mg via ORAL
  Filled 2013-06-15 (×2): qty 2

## 2013-06-15 MED ORDER — AMLODIPINE BESYLATE 10 MG PO TABS
10.0000 mg | ORAL_TABLET | Freq: Two times a day (BID) | ORAL | Status: DC
  Administered 2013-06-15 – 2013-06-17 (×5): 10 mg via ORAL
  Filled 2013-06-15 (×6): qty 1

## 2013-06-15 MED ORDER — DEXTROSE 5 % IV SOLN
500.0000 mg | INTRAVENOUS | Status: DC
  Administered 2013-06-15: 500 mg via INTRAVENOUS
  Filled 2013-06-15 (×2): qty 500

## 2013-06-15 MED ORDER — INSULIN ASPART 100 UNIT/ML ~~LOC~~ SOLN: 0.0000 [IU] | Freq: Every day | SUBCUTANEOUS | Status: DC

## 2013-06-15 MED ORDER — DEXTROSE 5 % IV SOLN
1.0000 g | INTRAVENOUS | Status: DC
  Administered 2013-06-15 – 2013-06-16 (×2): 1 g via INTRAVENOUS
  Filled 2013-06-15 (×2): qty 10

## 2013-06-15 MED ORDER — POTASSIUM CHLORIDE CRYS ER 20 MEQ PO TBCR
40.0000 meq | EXTENDED_RELEASE_TABLET | Freq: Every morning | ORAL | Status: DC
  Administered 2013-06-15 – 2013-06-16 (×2): 40 meq via ORAL
  Filled 2013-06-15 (×3): qty 2

## 2013-06-15 MED ORDER — DEXTROSE 5 % IV SOLN
1.0000 g | Freq: Once | INTRAVENOUS | Status: AC
  Administered 2013-06-15: 1 g via INTRAVENOUS
  Filled 2013-06-15: qty 10

## 2013-06-15 MED ORDER — ALBUTEROL SULFATE (2.5 MG/3ML) 0.083% IN NEBU
2.5000 mg | INHALATION_SOLUTION | Freq: Four times a day (QID) | RESPIRATORY_TRACT | Status: DC
  Administered 2013-06-15 – 2013-06-16 (×6): 2.5 mg via RESPIRATORY_TRACT
  Filled 2013-06-15 (×7): qty 3

## 2013-06-15 MED ORDER — ALBUTEROL SULFATE (2.5 MG/3ML) 0.083% IN NEBU: 2.5000 mg | INHALATION_SOLUTION | Freq: Two times a day (BID) | RESPIRATORY_TRACT | Status: DC

## 2013-06-15 MED ORDER — LATANOPROST 0.005 % OP SOLN
1.0000 [drp] | Freq: Every day | OPHTHALMIC | Status: DC
  Administered 2013-06-15 – 2013-06-16 (×2): 1 [drp] via OPHTHALMIC
  Filled 2013-06-15: qty 2.5

## 2013-06-15 MED ORDER — IPRATROPIUM BROMIDE 0.02 % IN SOLN: 0.5000 mg | Freq: Four times a day (QID) | RESPIRATORY_TRACT | Status: DC

## 2013-06-15 MED ORDER — FUROSEMIDE 40 MG PO TABS
40.0000 mg | ORAL_TABLET | Freq: Once | ORAL | Status: DC
  Filled 2013-06-15: qty 1

## 2013-06-15 MED ORDER — HYDRALAZINE HCL 50 MG PO TABS
100.0000 mg | ORAL_TABLET | Freq: Two times a day (BID) | ORAL | Status: DC
  Administered 2013-06-15 – 2013-06-17 (×5): 100 mg via ORAL
  Filled 2013-06-15 (×6): qty 2

## 2013-06-15 MED ORDER — DORZOLAMIDE HCL-TIMOLOL MAL 2-0.5 % OP SOLN
1.0000 [drp] | Freq: Every day | OPHTHALMIC | Status: DC
  Administered 2013-06-15 – 2013-06-16 (×2): 1 [drp] via OPHTHALMIC
  Filled 2013-06-15: qty 10

## 2013-06-15 MED ORDER — LEVOTHYROXINE SODIUM 150 MCG PO TABS
150.0000 ug | ORAL_TABLET | Freq: Every day | ORAL | Status: DC
  Administered 2013-06-15 – 2013-06-17 (×3): 150 ug via ORAL
  Filled 2013-06-15 (×4): qty 1

## 2013-06-15 MED ORDER — ONDANSETRON HCL 4 MG PO TABS
4.0000 mg | ORAL_TABLET | Freq: Four times a day (QID) | ORAL | Status: DC | PRN
  Administered 2013-06-15: 4 mg via ORAL
  Filled 2013-06-15: qty 1

## 2013-06-15 MED ORDER — INSULIN ASPART 100 UNIT/ML ~~LOC~~ SOLN
0.0000 [IU] | Freq: Three times a day (TID) | SUBCUTANEOUS | Status: DC
  Administered 2013-06-15 – 2013-06-16 (×3): 1 [IU] via SUBCUTANEOUS

## 2013-06-15 MED ORDER — DEXTROSE 5 % IV SOLN
500.0000 mg | Freq: Once | INTRAVENOUS | Status: AC
  Administered 2013-06-15: 500 mg via INTRAVENOUS

## 2013-06-15 NOTE — H&P (Signed)
[facility] History and Physical  Patient: Amanda Cunningham  TDV:[id number]  DOB: March 13, 1919  DOS: the patient was seen and examined on 06/15/2013 PCP: TODD,JEFFREY ALLEN, MD  Chief Complaint: Shortness of breath and cough  HPI: Amanda Cunningham is a 80 y.o. female with Past medical history of diabetes, hypertension, hypothyroidism, peripheral vascular disease, chronic kidney disease, neuropathy. The patient presented with complaints of cough that has been ongoing since last 3 days associated with yellowish expectoration without any blood. This was associated with progressively worsening shortness of breath. She mentions she has chronic shortness of breath at her baseline but does not use any oxygen. She also complained of some fever and chills without any chest pain. She was nauseated but no vomiting or aspiration. No abdominal pain no recent change in her medication no sick contacts no recent hospitalization. She has chronic lower extremity swelling and chronic lower extremity pain which he thinks is due to neuropathy. She has diabetes since last 10 years.  The patient is coming from home. And at her baseline independent for most of her ADL.  Review of Systems: as mentioned in the history of present illness.  A Comprehensive review of the other systems is negative.  Past Medical History  Diagnosis Date  . Anemia   . Glaucoma   . Diabetes mellitus   . Hypertension   . Thyroid disease   . Arthritis   . Cancer     breast  . Chronic kidney disease   . PAD (peripheral artery disease)   . Neuropathy    Past Surgical History  Procedure Laterality Date  . Total hip arthroplasty      left  . Replacement total knee      right  . Eye surgery    . Cholecystectomy    . Breast lumpectomy      left  . Ureteral stent placement     Social History:  reports that she has never smoked. She does not have any smokeless tobacco history on file. She reports that she does not drink alcohol or  use illicit drugs.  Allergies  Allergen Reactions  . Iodine Hives and Shortness Of Breath  . Tape Rash    Plastic tape    Family History  Problem Relation Age of Onset  . Heart disease Mother   . Coronary artery disease Father   . Cancer Brother     prostate cancer    Prior to Admission medications   Medication Sig Start Date End Date Taking? Authorizing Provider  amLODipine (NORVASC) 10 MG tablet Take 1 tablet (10 mg total) by mouth 2 (two) times daily. 05/18/11  Yes Theodis Blaze, MD  aspirin 81 MG tablet Take 1 tablet (81 mg total) by mouth daily. 05/18/11  Yes Theodis Blaze, MD  cloNIDine (CATAPRES) 0.2 MG tablet Take 1 tablet (0.2 mg total) by mouth 2 (two) times daily. 05/18/11  Yes Theodis Blaze, MD  clopidogrel (PLAVIX) 75 MG tablet Take 75 mg by mouth daily.    Yes Historical Provider, MD  dorzolamide-timolol (COSOPT) 22.3-6.8 MG/ML ophthalmic solution Place 1 drop into both eyes at bedtime.   Yes Historical Provider, MD  ferrous sulfate 325 (65 FE) MG tablet Take 325 mg by mouth daily with breakfast.   Yes Historical Provider, MD  glimepiride (AMARYL) 2 MG tablet Take 1 mg by mouth daily with breakfast.    Yes Historical Provider, MD  hydrALAZINE (APRESOLINE) 100 MG tablet Take 1 tablet (100 mg total) by mouth  2 (two) times daily. 05/03/12  Yes Doe-Hyun R Shawna Orleans, DO  hydrochlorothiazide (MICROZIDE) 12.5 MG capsule Take 12.5 mg by mouth daily.   Yes Historical Provider, MD  KLOR-CON M20 20 MEQ tablet Take 40 mEq by mouth every morning.  06/03/13  Yes Historical Provider, MD  labetalol (NORMODYNE) 200 MG tablet Take 1 tablet (200 mg total) by mouth 2 (two) times daily. 07/06/12  Yes Dorena Cookey, MD  levothyroxine (SYNTHROID, LEVOTHROID) 150 MCG tablet Take 150 mcg by mouth daily before breakfast.    Yes Historical Provider, MD  Melatonin 1 MG TABS Take 1 mg by mouth at bedtime as needed (sleep).    Yes Historical Provider, MD  Multiple Vitamins-Minerals (PRESERVISION/LUTEIN) CAPS Take  1 capsule by mouth 2 (two) times daily.    Yes Historical Provider, MD  pantoprazole (PROTONIX) 40 MG tablet Take 40 mg by mouth daily.     Yes Historical Provider, MD  travoprost, benzalkonium, (TRAVATAN) 0.004 % ophthalmic solution Place 1 drop into both eyes at bedtime.    Yes Historical Provider, MD    Physical Exam: Filed Vitals:   06/15/13 0053 06/15/13 0058 06/15/13 0130 06/15/13 0229  BP:  164/50  155/50  Pulse:   86 81  Temp: 98.8 F (37.1 C)   98.4 F (36.9 C)  TempSrc: Oral   Oral  Resp:   19 17  Height:    5\' 1"  (1.549 m)  Weight:    89.6 kg (197 lb 8.5 oz)  SpO2:   96% 96%    General: Alert, Awake and Oriented to Time, Place and Person. Appear in mild distress Eyes: PERRL ENT: Oral Mucosa clear dry. Neck: No JVD Cardiovascular: S1 and S2 Present, aortic systolic Murmur, Peripheral Pulses Present Respiratory: Bilateral Air entry equal and Decreased, no Crackles, expiratory wheezes Abdomen: Bowel Sound Present, Soft and Non tender Skin: No Rash Extremities: Bilateral Pedal edema, no calf tenderness Neurologic: Grossly no focal neuro deficit.  Labs on Admission:  CBC:  Recent Labs Lab 06/14/13 2315  WBC 19.6*  NEUTROABS 18.0*  HGB 9.2*  HCT 29.4*  MCV 78.0  PLT 279    CMP     Component Value Date/Time   NA 139 06/14/2013 2315   K 3.4* 06/14/2013 2315   CL 102 06/14/2013 2315   CO2 20 06/14/2013 2315   GLUCOSE 170* 06/14/2013 2315   BUN 29* 06/14/2013 2315   CREATININE 1.37* 06/14/2013 2315   CREATININE 1.49* 12/21/2012 1139   CALCIUM 8.2* 06/14/2013 2315   PROT 6.3 06/14/2013 2315   ALBUMIN 2.7* 06/14/2013 2315   AST 23 06/14/2013 2315   ALT 24 06/14/2013 2315   ALKPHOS 133* 06/14/2013 2315   BILITOT 0.4 06/14/2013 2315   GFRNONAA 33* 06/14/2013 2315   GFRNONAA 30* 12/21/2012 1139   GFRAA 38* 06/14/2013 2315   GFRAA 35* 12/21/2012 1139     Recent Labs Lab 06/14/13 2315  LIPASE 31   No results found for this basename: AMMONIA,  in the last 168 hours  No results  found for this basename: CKTOTAL, CKMB, CKMBINDEX, TROPONINI,  in the last 168 hours BNP (last 3 results)  Recent Labs  04/25/13 1232 06/14/13 2315  PROBNP 159.0* 1982.0*    Radiological Exams on Admission: Dg Chest Port 1 View  06/14/2013   CLINICAL DATA:  Shortness of breath and fever.  EXAM: PORTABLE CHEST - 1 VIEW  COMPARISON:  DG CHEST 2 VIEW dated 07/23/2010  FINDINGS: Cardiac silhouette is at least mildly enlarged,  mediastinal silhouette is nonsuspicious, moderately calcified aortic knob. Central pulmonary vasculature congestion. Similar mild chronic interstitial prominence, elevated right hemidiaphragm. Strandy densities in left lung base. No pneumothorax.  Surgical clips in left breast. Osseous structures are nonsuspicious. Multiple EKG lines overlie the patient and may obscure subtle underlying pathology.  IMPRESSION: Stable cardiomegaly, central pulmonary vasculature congestion and left lung base atelectasis.   Electronically Signed   By: Elon Alas   On: 06/14/2013 23:43    Assessment/Plan Active Problems:   DM (diabetes mellitus)   Thyroid disease   Neuropathy   Hypokalemia   Hypertension, malignant   Acute bronchiolitis   1. Acute bronchiolitis The patient is presenting with complaints of cough and shortness of breath ongoing since last 3 days. She was significantly hypoxic on arrival to ED with requiring ninth of 4 L of oxygen currently she is on 2 L of oxygen and appears tachypneic and tachycardic. With that the patient will be admitted to the hospital for further workup and possible treatment for acute bronchitis or bronchiolitis. I will give her IV ceftriaxone and azithromycin and check sputum culture, influenza PCR, urine antigens.  2. Malignant hypertension Possible diastolic dysfunction Patient presented with cough and shortness of breath. This could also represent sudden worsening of her diastolic dysfunction. She has significantly elevated proBNP from her  value done 1 month ago. I would hold her hydrochlorothiazide and switch her to oral Lasix at this point. If this does not improve her symptoms she may require limited echocardiogram for further workup. Does not appear to have any acute ongoing ACS. Continue to monitor. Blood pressure has significantly improved therefore I would continue her on home antihypertensive medications.  3. Diabetes mellitus Patient on sliding scale and home insulin, according oral hypoglycemics.  4. Hypothyroidism Holding Synthroid check TSH.  DVT Prophylaxis: subcutaneous Heparin Nutrition: Cardiac and diabetic diet  Code Status: Full  Family Communication: Family was present at bedside, opportunity was given to ask question and all questions were answered satisfactorily at the time of interview. Disposition: Admitted to inpatient in telemetry unit.  Author: Berle Mull, MD [facility] Pager: [phone] 06/15/2013, 2:52 AM    If 7PM-7AM, please contact night-coverage [email or web address] Password [email or web address]

## 2013-06-15 NOTE — ED Provider Notes (Signed)
CSN: [id number]     Arrival date & time 06/14/13  2239 History   First MD Initiated Contact with Patient 06/14/13 2300     Chief Complaint  Patient presents with  . Emesis  . Fever     (Consider location/radiation/quality/duration/timing/severity/associated sxs/prior Treatment) HPI 80 yo female presents to the ER from home via EMS with complaint of fever, shortness of breath, vomiting.  Pt began feeling unwell on Sunday with fatigue.  Today around 10 am she began to feel nauseated.  8 pm she had coughing and vomiting of yellow phlegm.  At that time she had temp of 100.  Pt has h/o chf, anemia, CKD, breast cancer, DM, HTN, pna.  Pt lives in [place] and winters here.  Past Medical History  Diagnosis Date  . Anemia   . Glaucoma   . Diabetes mellitus   . Hypertension   . Thyroid disease   . Arthritis   . Cancer     breast  . Chronic kidney disease   . PAD (peripheral artery disease)   . Neuropathy    Past Surgical History  Procedure Laterality Date  . Total hip arthroplasty      left  . Replacement total knee      right  . Eye surgery    . Cholecystectomy    . Breast lumpectomy      left  . Ureteral stent placement     Family History  Problem Relation Age of Onset  . Heart disease Mother   . Coronary artery disease Father   . Cancer Brother     prostate cancer   History  Substance Use Topics  . Smoking status: Never Smoker   . Smokeless tobacco: Not on file  . Alcohol Use: No   OB History   Grav Para Term Preterm Abortions TAB SAB Ect Mult Living                 Review of Systems  All other systems reviewed and are negative.     Allergies  Iodine and Tape  Home Medications   Prior to Admission medications   Medication Sig Start Date End Date Taking? Authorizing Provider  hydrochlorothiazide (MICROZIDE) 12.5 MG capsule Take 12.5 mg by mouth daily.   Yes Historical Provider, MD  amLODipine (NORVASC) 10 MG tablet Take 1 tablet (10 mg total) by mouth 2  (two) times daily. 05/18/11   Theodis Blaze, MD  aspirin 81 MG tablet Take 1 tablet (81 mg total) by mouth daily. 05/18/11   Theodis Blaze, MD  cloNIDine (CATAPRES) 0.2 MG tablet Take 1 tablet (0.2 mg total) by mouth 2 (two) times daily. 05/18/11   Theodis Blaze, MD  clopidogrel (PLAVIX) 75 MG tablet Take 75 mg by mouth daily.     Historical Provider, MD  dorzolamide-timolol (COSOPT) 22.3-6.8 MG/ML ophthalmic solution Place 1 drop into both eyes at bedtime.    Historical Provider, MD  glimepiride (AMARYL) 2 MG tablet Take 1-2 mg by mouth daily with breakfast.     Historical Provider, MD  hydrALAZINE (APRESOLINE) 100 MG tablet Take 1 tablet (100 mg total) by mouth 2 (two) times daily. 05/03/12   Doe-Hyun R Shawna Orleans, DO  labetalol (NORMODYNE) 200 MG tablet Take 1 tablet (200 mg total) by mouth 2 (two) times daily. 07/06/12   Dorena Cookey, MD  levothyroxine (SYNTHROID, LEVOTHROID) 150 MCG tablet Take 150 mcg by mouth daily before breakfast.     Historical Provider, MD  Melatonin  1 MG TABS Take 1 mg by mouth at bedtime.     Historical Provider, MD  Multiple Vitamins-Minerals (PRESERVISION/LUTEIN) CAPS Take 1 capsule by mouth 2 (two) times daily.     Historical Provider, MD  pantoprazole (PROTONIX) 40 MG tablet Take 40 mg by mouth daily.      Historical Provider, MD  travoprost, benzalkonium, (TRAVATAN) 0.004 % ophthalmic solution Place 1 drop into both eyes at bedtime.     Historical Provider, MD   BP 207/62  Pulse 73  Temp(Src) 102.6 F (39.2 C) (Rectal)  SpO2 96% Physical Exam  Nursing note and vitals reviewed. Constitutional: She is oriented to person, place, and time. She appears well-developed and well-nourished. She appears distressed.  HENT:  Head: Normocephalic and atraumatic.  Nose: Nose normal.  Mouth/Throat: Oropharynx is clear and moist.  Eyes: Conjunctivae and EOM are normal. Pupils are equal, round, and reactive to light.  Neck: Normal range of motion. Neck supple. No JVD present. No  tracheal deviation present. No thyromegaly present.  Cardiovascular: Normal rate, regular rhythm, normal heart sounds and intact distal pulses.  Exam reveals no gallop and no friction rub.   No murmur heard. Pulmonary/Chest: Effort normal. No stridor. No respiratory distress. She has no wheezes. She has rales (and rhonchi in right base). She exhibits no tenderness.  Abdominal: Soft. Bowel sounds are normal. She exhibits no distension and no mass. There is no tenderness. There is no rebound and no guarding.  Musculoskeletal: Normal range of motion. She exhibits edema (1+). She exhibits no tenderness.  Lymphadenopathy:    She has no cervical adenopathy.  Neurological: She is alert and oriented to person, place, and time. She exhibits normal muscle tone. Coordination normal.  Skin: Skin is warm and dry. No rash noted. No erythema. No pallor.  Psychiatric: She has a normal mood and affect. Her behavior is normal. Judgment and thought content normal.    ED Course  Procedures (including critical care time) Labs Review Labs Reviewed  CBC WITH DIFFERENTIAL - Abnormal; Notable for the following:    WBC 19.6 (*)    RBC 3.77 (*)    Hemoglobin 9.2 (*)    HCT 29.4 (*)    MCH 24.4 (*)    RDW 16.8 (*)    Neutrophils Relative % 92 (*)    Lymphocytes Relative 2 (*)    Neutro Abs 18.0 (*)    Lymphs Abs 0.4 (*)    Monocytes Absolute 1.2 (*)    All other components within normal limits  COMPREHENSIVE METABOLIC PANEL - Abnormal; Notable for the following:    Potassium 3.4 (*)    Glucose, Bld 170 (*)    BUN 29 (*)    Creatinine, Ser 1.37 (*)    Calcium 8.2 (*)    Albumin 2.7 (*)    Alkaline Phosphatase 133 (*)    GFR calc non Af Amer 33 (*)    GFR calc Af Amer 38 (*)    All other components within normal limits  PRO B NATRIURETIC PEPTIDE - Abnormal; Notable for the following:    Pro B Natriuretic peptide (BNP) 1982.0 (*)    All other components within normal limits  URINALYSIS, ROUTINE W REFLEX  MICROSCOPIC - Abnormal; Notable for the following:    Protein, ur >300 (*)    All other components within normal limits  CULTURE, BLOOD (ROUTINE X 2)  CULTURE, BLOOD (ROUTINE X 2)  URINE CULTURE  LIPASE, BLOOD  URINE MICROSCOPIC-ADD ON  I-STAT CG4 LACTIC  ACID, ED    Imaging Review Dg Chest Port 1 View  06/14/2013   CLINICAL DATA:  Shortness of breath and fever.  EXAM: PORTABLE CHEST - 1 VIEW  COMPARISON:  DG CHEST 2 VIEW dated 07/23/2010  FINDINGS: Cardiac silhouette is at least mildly enlarged, mediastinal silhouette is nonsuspicious, moderately calcified aortic knob. Central pulmonary vasculature congestion. Similar mild chronic interstitial prominence, elevated right hemidiaphragm. Strandy densities in left lung base. No pneumothorax.  Surgical clips in left breast. Osseous structures are nonsuspicious. Multiple EKG lines overlie the patient and may obscure subtle underlying pathology.  IMPRESSION: Stable cardiomegaly, central pulmonary vasculature congestion and left lung base atelectasis.   Electronically Signed   By: Elon Alas   On: 06/14/2013 23:43     EKG Interpretation   Date/Time:  Tuesday Jun 14 2013 22:50:22 EDT Ventricular Rate:  99 PR Interval:  204 QRS Duration: 101 QT Interval:  359 QTC Calculation: 461 R Axis:   77 Text Interpretation:  Atrial fibrillation Multiple premature complexes,  vent  Low voltage, precordial leads Borderline repolarization abnormality  Confirmed by Aaron Boeh  MD, Silas Muff ([id number]) on 06/15/2013 2:41:14 AM      MDM   Final diagnoses:  CAP (community acquired pneumonia)    80 yo female with fever, coughing, vomiting, and rhonchi.  Suspect CAP.  No pna seen on port chest xray, suspect occult pna.  Will start abx, consult hospitalist for admission.    Kalman Drape, MD 06/15/13 [phone]

## 2013-06-15 NOTE — Progress Notes (Signed)
Pt seen and examined,  89/F with DM, HTN, CHF-possibly diastolic, CKD admitted with Dyspnea, cough, fevers Suspect pneumonia vs bronchitis, CXr with pulm vasc congestion and atelectasis -Continue Abx, PO lasix -add nebs -ambulate, PT eval  Domenic Polite, MD [phone]

## 2013-06-15 NOTE — Care Management Note (Signed)
    Page 1 of 1   06/15/2013     2:19:11 PM CARE MANAGEMENT NOTE 06/15/2013  Patient:  Amanda Cunningham,Amanda Cunningham   Account Number:  [id number]  Date Initiated:  06/15/2013  Documentation initiated by:  Dessa Phi  Subjective/Objective Assessment:   80 Y/O F ADMITTED W/PNA.     Action/Plan:   VISITING [facility]( [facility][phone])IN [place],SHE IS FROM [place].HAS PCP,& PHARMACY IN [place].HAS CANE,RW,W/C.   Anticipated DC Date:  06/20/2013   Anticipated DC Plan:  [place]  CM consult      Choice offered to / List presented to:             Status of service:  In process, will continue to follow Medicare Important Message given?   (If response is "NO", the following Medicare IM given date fields will be blank) Date Medicare IM given:   Date Additional Medicare IM given:    Discharge Disposition:    Per UR Regulation:  Reviewed for med. necessity/level of care/duration of stay  If discussed at [place] of Stay Meetings, dates discussed:    Comments:  06/15/13 Porchia Sinkler RN,BSN NCM [phone] RECOMMEND PT/OT CONS.

## 2013-06-15 NOTE — Evaluation (Signed)
Physical Therapy Evaluation Patient Details Name: Amanda Cunningham MRN: [id number] DOB: March 31, 1919 Today's Date: 06/15/2013   History of Present Illness  Pt is a 80 year old female with past medical history of diabetes, hypertension, hypothyroidism, peripheral vascular disease, chronic kidney disease, neuropathy, glaucoma, arthritis and admitted 5/5 for acute bronchiolitis.   Clinical Impression  Pt currently with functional limitations due to the deficits listed below (see PT Problem List).  Pt will benefit from skilled PT to increase their independence and safety with mobility to allow discharge to the venue listed below.  Pt reports she feels close to her baseline.  Pt states she prefers using her SW as it feels more secure and she usually only ambulates short household distances.  No f/u needs identified.      Follow Up Recommendations No PT follow up    Equipment Recommendations  None recommended by PT    Recommendations for Other Services       Precautions / Restrictions Precautions Precautions: Fall      Mobility  Bed Mobility Overal bed mobility: Needs Assistance Bed Mobility: Supine to Sit;Sit to Supine     Supine to sit: Supervision Sit to supine: Supervision      Transfers Overall transfer level: Needs assistance Equipment used: Rolling walker (2 wheeled) Transfers: Sit to/from Stand Sit to Stand: Min guard         General transfer comment: verbal cues for hand placement  Ambulation/Gait Ambulation/Gait assistance: Min guard Ambulation Distance (Feet): 140 Feet Assistive device: Rolling walker (2 wheeled) Gait Pattern/deviations: Step-through pattern Gait velocity: decr   General Gait Details: pt reports weakness in LEs (sometimes LEs "give way"), pt ambulated on room air with SpO2 95%, pt does report mild SOB however states that is her baseline during mobility  Stairs            Wheelchair Mobility    Modified Rankin (Stroke Patients Only)        Balance                                             Pertinent Vitals/Pain SpO2 at rest on room air 97% SpO2 on room air during ambulation 95% Reapplied 2L O2 [place] upon return to room    Home Living Family/patient expects to be discharged to:: Private residence Living Arrangements: Spouse/significant other;Children   Type of Home: House Home Access: Stairs to enter Entrance Stairs-Rails: Can reach both;Left;Right Entrance Stairs-Number of Steps: 6 Home Layout: Able to live on main level with bedroom/bathroom Home Equipment: Wheelchair - manual;Cane - single point;Walker - standard Additional Comments: lives in [place] originally, here visiting daughter and to stay a few more weeks (above is daughter's home where she plans to return upon d/c)  her home is split level    Prior Function Level of Independence: Independent with assistive device(s)         Comments: usually uses SW around home, w/c for community     Hand Dominance        Extremity/Trunk Assessment               Lower Extremity Assessment: Generalized weakness         Communication   Communication: No difficulties  Cognition Arousal/Alertness: Awake/alert Behavior During Therapy: WFL for tasks assessed/performed Overall Cognitive Status: Within Functional Limits for tasks assessed  General Comments      Exercises        Assessment/Plan    PT Assessment Patient needs continued PT services  PT Diagnosis Difficulty walking   PT Problem List Decreased strength;Decreased activity tolerance;Decreased mobility  PT Treatment Interventions DME instruction;Gait training;Functional mobility training;Therapeutic activities;Therapeutic exercise;Patient/family education   PT Goals (Current goals can be found in the Care Plan section) Acute Rehab PT Goals PT Goal Formulation: With patient Time For Goal Achievement: 06/22/13 Potential to Achieve Goals:  Good    Frequency Min 3X/week   Barriers to discharge        Co-evaluation               End of Session Equipment Utilized During Treatment: Gait belt Activity Tolerance: Patient tolerated treatment well Patient left: in bed;with call bell/phone within reach;with bed alarm set           Time: 1500-1516 PT Time Calculation (min): 16 min   Charges:   PT Evaluation $Initial PT Evaluation Tier I: 1 Procedure PT Treatments $Gait Training: 8-22 mins   PT G CodesJunius Argyle 06/15/2013, 3:46 PM Carmelia Bake, PT, DPT 06/15/2013 Pager: [phone]\

## 2013-06-15 NOTE — Progress Notes (Signed)
Patient admitted with flu like symptoms.  Flu PCR obtained.  Patient currently on droplet precautions at this time while PCR is pending.  Family and patient made aware.  Handout given to patient regarding droplet precautions.

## 2013-06-15 NOTE — Progress Notes (Signed)
Flu PCR negative. Droplet precautions d/c. Family and patient informed.  Will continue to monitor.

## 2013-06-16 ENCOUNTER — Inpatient Hospital Stay (HOSPITAL_COMMUNITY): Payer: Medicare Other

## 2013-06-16 DIAGNOSIS — N179 Acute kidney failure, unspecified: Secondary | ICD-10-CM | POA: Diagnosis not present

## 2013-06-16 DIAGNOSIS — E119 Type 2 diabetes mellitus without complications: Secondary | ICD-10-CM | POA: Diagnosis not present

## 2013-06-16 DIAGNOSIS — J218 Acute bronchiolitis due to other specified organisms: Principal | ICD-10-CM

## 2013-06-16 DIAGNOSIS — I10 Essential (primary) hypertension: Secondary | ICD-10-CM

## 2013-06-16 DIAGNOSIS — R0602 Shortness of breath: Secondary | ICD-10-CM | POA: Diagnosis not present

## 2013-06-16 DIAGNOSIS — J9819 Other pulmonary collapse: Secondary | ICD-10-CM | POA: Diagnosis not present

## 2013-06-16 DIAGNOSIS — J189 Pneumonia, unspecified organism: Secondary | ICD-10-CM | POA: Diagnosis not present

## 2013-06-16 LAB — URINE CULTURE
Colony Count: NO GROWTH
Culture: NO GROWTH

## 2013-06-16 LAB — CBC
HCT: 27.5 % — ABNORMAL LOW (ref 36.0–46.0)
Hemoglobin: 8.4 g/dL — ABNORMAL LOW (ref 12.0–15.0)
MCH: 24.1 pg — ABNORMAL LOW (ref 26.0–34.0)
MCHC: 30.5 g/dL (ref 30.0–36.0)
MCV: 79 fL (ref 78.0–100.0)
PLATELETS: 267 10*3/uL (ref 150–400)
RBC: 3.48 MIL/uL — ABNORMAL LOW (ref 3.87–5.11)
RDW: 17 % — ABNORMAL HIGH (ref 11.5–15.5)
WBC: 13.5 10*3/uL — ABNORMAL HIGH (ref 4.0–10.5)

## 2013-06-16 LAB — BASIC METABOLIC PANEL
BUN: 35 mg/dL — ABNORMAL HIGH (ref 6–23)
CHLORIDE: 104 meq/L (ref 96–112)
CO2: 23 mEq/L (ref 19–32)
CREATININE: 1.93 mg/dL — AB (ref 0.50–1.10)
Calcium: 8.2 mg/dL — ABNORMAL LOW (ref 8.4–10.5)
GFR calc Af Amer: 25 mL/min — ABNORMAL LOW (ref >90)
GFR calc non Af Amer: 22 mL/min — ABNORMAL LOW (ref >90)
Glucose, Bld: 99 mg/dL (ref 70–99)
Potassium: 3.7 mEq/L (ref 3.7–5.3)
Sodium: 140 mEq/L (ref 137–147)

## 2013-06-16 LAB — LEGIONELLA ANTIGEN, URINE: Legionella Antigen, Urine: NEGATIVE

## 2013-06-16 LAB — GLUCOSE, CAPILLARY
GLUCOSE-CAPILLARY: 95 mg/dL (ref 70–99)
Glucose-Capillary: 77 mg/dL (ref 70–99)
Glucose-Capillary: 125 mg/dL — ABNORMAL HIGH (ref 70–99)
Glucose-Capillary: 86 mg/dL (ref 70–99)
Glucose-Capillary: 140 mg/dL — ABNORMAL HIGH (ref 70–99)

## 2013-06-16 MED ORDER — FUROSEMIDE 20 MG PO TABS
20.0000 mg | ORAL_TABLET | Freq: Every day | ORAL | Status: DC
  Administered 2013-06-16 – 2013-06-17 (×2): 20 mg via ORAL
  Filled 2013-06-16 (×2): qty 1

## 2013-06-16 MED ORDER — ALBUTEROL SULFATE (2.5 MG/3ML) 0.083% IN NEBU
2.5000 mg | INHALATION_SOLUTION | Freq: Two times a day (BID) | RESPIRATORY_TRACT | Status: DC
  Administered 2013-06-17: 2.5 mg via RESPIRATORY_TRACT
  Filled 2013-06-16: qty 3

## 2013-06-16 MED ORDER — ALBUTEROL SULFATE (2.5 MG/3ML) 0.083% IN NEBU: 2.5000 mg | INHALATION_SOLUTION | Freq: Three times a day (TID) | RESPIRATORY_TRACT | Status: DC | PRN

## 2013-06-16 NOTE — Progress Notes (Signed)
[facility] PROGRESS NOTE  Mary[place] Pink [id number]:[id number] DOB: [birth year] DOA: 06/14/2013 PCP: Joycelyn Man, MD  Assessment/Plan:  Acute bronchitis -repeat CXR without evidence of pneumonia -continue ceftriaxone -clinically improving, continue anti-tussives -influenza PCR, urine Ags negative  Acute on chronic diastolic CHF -improving -cut down lasix due to bump in creatinine -check 2D ECHO -I/Os  Malignant hypertension  -stable, continue norvasc, clonidine, hydralazine, labetalol, lasix  Diabetes mellitus  -sliding scale, hold oral hypoglycemics.   Hypothyroidism  -continue Synthroid   DVT Prophylaxis: subcutaneous Heparin   Code Status: Full Code Family Communication: none at bedside, d/w daughter 5/6 Disposition Plan: home in 1-2days  HPI/Subjective: Feels better, still coughing some  Objective: Filed Vitals:   06/16/13 0500  BP: 155/54  Pulse: 75  Temp: 97.6 F (36.4 C)  Resp: 20    Intake/Output Summary (Last 24 hours) at 06/16/13 1140 Last data filed at 06/16/13 0900  Gross per 24 hour  Intake    360 ml  Output      0 ml  Net    360 ml   Filed Weights   06/15/13 0229 06/16/13 0500  Weight: 89.6 kg (197 lb 8.5 oz) 89.1 kg (196 lb 6.9 oz)    Exam:   General:  AAOx3, no distress  Cardiovascular: S1S2/RRR  Respiratory: diminished at bases, rest clear  Abdomen: soft, obese, NT, BS present  Musculoskeletal: no edema c/c   Data Reviewed: Basic Metabolic Panel:  Recent Labs Lab 06/14/13 2315 06/15/13 0513 06/16/13 0322  NA 139 139 140  K 3.4* 3.6* 3.7  CL 102 104 104  CO2 20 22 23   GLUCOSE 170* 145* 99  BUN 29* 29* 35*  CREATININE 1.37* 1.51* 1.93*  CALCIUM 8.2* 8.5 8.2*   Liver Function Tests:  Recent Labs Lab 06/14/13 2315 06/15/13 0513  AST 23 20  ALT 24 24  ALKPHOS 133* 128*  BILITOT 0.4 0.3  PROT 6.3 5.9*  ALBUMIN 2.7* 2.5*    Recent Labs Lab 06/14/13 2315  LIPASE 31   No results found for this  basename: AMMONIA,  in the last 168 hours CBC:  Recent Labs Lab 06/14/13 2315 06/15/13 0513 06/16/13 0322  WBC 19.6* 23.1* 13.5*  NEUTROABS 18.0*  --   --   HGB 9.2* 9.2* 8.4*  HCT 29.4* 29.5* 27.5*  MCV 78.0 78.5 79.0  PLT 279 279 267   Cardiac Enzymes:  Recent Labs Lab 06/15/13 0520  TROPONINI <0.30   BNP (last 3 results)  Recent Labs  04/25/13 1232 06/14/13 2315  PROBNP 159.0* 1982.0*   CBG:  Recent Labs Lab 06/15/13 0743 06/15/13 1129 06/15/13 1700 06/15/13 2136 06/16/13 0737  GLUCAP 126* 144* 84 95 77    Recent Results (from the past 240 hour(s))  CULTURE, BLOOD (ROUTINE X 2)     Status: None   Collection Time    06/14/13 11:15 PM      Result Value Ref Range Status   Specimen Description BLOOD RIGHT ARM   Final   Special Requests BOTTLES DRAWN AEROBIC AND ANAEROBIC 4CC   Final   Culture  Setup Time     Final   Value: 06/15/2013 04:27     Performed at [facility]   Culture     Final   Value:        BLOOD CULTURE RECEIVED NO GROWTH TO DATE CULTURE WILL BE HELD FOR 5 DAYS BEFORE ISSUING A FINAL NEGATIVE REPORT     Performed at [facility]  Report Status PENDING   Incomplete  CULTURE, BLOOD (ROUTINE X 2)     Status: None   Collection Time    06/14/13 11:30 PM      Result Value Ref Range Status   Specimen Description BLOOD RIGHT HAND   Final   Special Requests     Final   Value: BOTTLES DRAWN AEROBIC AND ANAEROBIC 6CC IMMUNOCOMPROMISED   Culture  Setup Time     Final   Value: 06/15/2013 04:27     Performed at [facility]   Culture     Final   Value:        BLOOD CULTURE RECEIVED NO GROWTH TO DATE CULTURE WILL BE HELD FOR 5 DAYS BEFORE ISSUING A FINAL NEGATIVE REPORT     Performed at [facility]   Report Status PENDING   Incomplete  URINE CULTURE     Status: None   Collection Time    06/14/13 11:56 PM      Result Value Ref Range Status   Specimen Description URINE, RANDOM   Final   Special Requests  Immunocompromised   Final   Culture  Setup Time     Final   Value: 06/15/2013 04:06     Performed at [facility]     Final   Value: NO GROWTH     Performed at [facility]   Culture     Final   Value: NO GROWTH     Performed at [facility]   Report Status 06/16/2013 FINAL   Final  CULTURE, EXPECTORATED SPUTUM-ASSESSMENT     Status: None   Collection Time    06/15/13  8:36 AM      Result Value Ref Range Status   Specimen Description SPUTUM   Final   Special Requests NONE   Final   Sputum evaluation     Final   Value: THIS SPECIMEN IS ACCEPTABLE. RESPIRATORY CULTURE REPORT TO FOLLOW.   Report Status 06/15/2013 FINAL   Final  CULTURE, RESPIRATORY (NON-EXPECTORATED)     Status: None   Collection Time    06/15/13  8:36 AM      Result Value Ref Range Status   Specimen Description SPUTUM   Final   Special Requests NONE   Final   Gram Stain     Final   Value: RARE WBC PRESENT,BOTH PMN AND MONONUCLEAR     NO SQUAMOUS EPITHELIAL CELLS SEEN     RARE GRAM POSITIVE COCCI IN PAIRS     Performed at [facility]   Culture     Final   Value: NORMAL OROPHARYNGEAL FLORA     Performed at [facility]   Report Status PENDING   Incomplete     Studies: Dg Chest 2 View  06/16/2013   CLINICAL DATA:  FU suspect pneumonia  EXAM: CHEST  2 VIEW  COMPARISON:  DG CHEST 1V PORT dated 06/14/2013  FINDINGS: Low lung volumes. Cardiac silhouette is enlarged. Mild prominence of interstitial markings. Slight improved aeration left lung base. No new focal regions of consolidation or new focal infiltrates. No acute osseous abnormalities, mild degenerative changes within the shoulders.  IMPRESSION: Pulmonary vascular congestion.  Improved aeration left lung base, decreased atelectasis.   Electronically Signed   By: Margaree Mackintosh M.D.   On: 06/16/2013 08:52   Dg Chest Port 1 View  06/14/2013   CLINICAL DATA:  Shortness of breath and fever.  EXAM: PORTABLE CHEST -  1 VIEW  COMPARISON:  DG CHEST 2 VIEW dated 07/23/2010  FINDINGS: Cardiac silhouette is at least mildly enlarged, mediastinal silhouette is nonsuspicious, moderately calcified aortic knob. Central pulmonary vasculature congestion. Similar mild chronic interstitial prominence, elevated right hemidiaphragm. Strandy densities in left lung base. No pneumothorax.  Surgical clips in left breast. Osseous structures are nonsuspicious. Multiple EKG lines overlie the patient and may obscure subtle underlying pathology.  IMPRESSION: Stable cardiomegaly, central pulmonary vasculature congestion and left lung base atelectasis.   Electronically Signed   By: Elon Alas   On: 06/14/2013 23:43    Scheduled Meds: . albuterol  2.5 mg Nebulization QID  . amLODipine  10 mg Oral BID  . aspirin EC  81 mg Oral Daily  . cefTRIAXone (ROCEPHIN)  IV  1 g Intravenous Q24H  . cloNIDine  0.2 mg Oral BID  . clopidogrel  75 mg Oral Daily  . dorzolamide-timolol  1 drop Both Eyes QHS  . ferrous sulfate  325 mg Oral Q breakfast  . furosemide  20 mg Oral Daily  . guaiFENesin  600 mg Oral BID  . heparin  5,000 Units Subcutaneous 3 times per day  . hydrALAZINE  100 mg Oral BID  . insulin aspart  0-5 Units Subcutaneous QHS  . insulin aspart  0-9 Units Subcutaneous TID WC  . labetalol  200 mg Oral BID  . latanoprost  1 drop Both Eyes QHS  . levothyroxine  150 mcg Oral QAC breakfast  . pantoprazole  40 mg Oral Daily  . potassium chloride SA  40 mEq Oral q morning - 10a  . sodium chloride  3 mL Intravenous Q12H   Continuous Infusions:  Antibiotics Given (last 72 hours)   Date/Time Action Medication Dose Rate   06/15/13 2126 Given   cefTRIAXone (ROCEPHIN) 1 g in dextrose 5 % 50 mL IVPB 1 g 100 mL/hr   06/15/13 2126 Given   azithromycin (ZITHROMAX) 500 mg in dextrose 5 % 250 mL IVPB 500 mg 250 mL/hr      Principal Problem:   Acute bronchiolitis Active Problems:   DM (diabetes mellitus)   Thyroid disease    Neuropathy   Hypokalemia   Hypertension, malignant    Time spent: 39min    [facility] Pager [phone]. If 7PM-7AM, please contact night-coverage at [email or web address], password [facility] 06/16/2013, 11:40 AM  LOS: 2 days

## 2013-06-16 NOTE — Progress Notes (Signed)
Echocardiogram 2D Echocardiogram has been performed.  Amanda Cunningham 06/16/2013, 12:10 PM

## 2013-06-17 DIAGNOSIS — J209 Acute bronchitis, unspecified: Secondary | ICD-10-CM | POA: Diagnosis not present

## 2013-06-17 DIAGNOSIS — E119 Type 2 diabetes mellitus without complications: Secondary | ICD-10-CM | POA: Diagnosis not present

## 2013-06-17 DIAGNOSIS — I509 Heart failure, unspecified: Secondary | ICD-10-CM

## 2013-06-17 DIAGNOSIS — I5031 Acute diastolic (congestive) heart failure: Secondary | ICD-10-CM

## 2013-06-17 LAB — CULTURE, RESPIRATORY W GRAM STAIN: Culture: NORMAL

## 2013-06-17 LAB — GLUCOSE, CAPILLARY: Glucose-Capillary: 109 mg/dL — ABNORMAL HIGH (ref 70–99)

## 2013-06-17 LAB — BASIC METABOLIC PANEL
BUN: 36 mg/dL — AB (ref 6–23)
CHLORIDE: 104 meq/L (ref 96–112)
CO2: 23 mEq/L (ref 19–32)
Calcium: 8.6 mg/dL (ref 8.4–10.5)
Creatinine, Ser: 1.89 mg/dL — ABNORMAL HIGH (ref 0.50–1.10)
GFR calc Af Amer: 26 mL/min — ABNORMAL LOW (ref >90)
GFR calc non Af Amer: 22 mL/min — ABNORMAL LOW (ref >90)
Glucose, Bld: 111 mg/dL — ABNORMAL HIGH (ref 70–99)
POTASSIUM: 4.7 meq/L (ref 3.7–5.3)
Sodium: 140 mEq/L (ref 137–147)

## 2013-06-17 LAB — CULTURE, RESPIRATORY

## 2013-06-17 MED ORDER — LEVOFLOXACIN 250 MG PO TABS
250.0000 mg | ORAL_TABLET | Freq: Every day | ORAL | Status: DC
  Administered 2013-06-17: 250 mg via ORAL
  Filled 2013-06-17: qty 1

## 2013-06-17 MED ORDER — LEVOFLOXACIN 500 MG PO TABS: 500.0000 mg | ORAL_TABLET | Freq: Every day | ORAL | Status: DC

## 2013-06-17 MED ORDER — LEVOFLOXACIN 250 MG PO TABS: 250.0000 mg | ORAL_TABLET | Freq: Every day | ORAL | Status: DC

## 2013-06-17 MED ORDER — FUROSEMIDE 20 MG PO TABS: 20.0000 mg | ORAL_TABLET | Freq: Every day | ORAL | Status: DC

## 2013-06-17 NOTE — Discharge Summary (Signed)
Physician Discharge Summary  [place] Pink XO:[id number] DOB: Sep 13, 1919 DOA: 06/14/2013  PCP: Joycelyn Man, MD  Admit date: 06/14/2013 Discharge date: 06/17/2013  Time spent: 45 minutes  Recommendations for Outpatient Follow-up:  1. Dr.Todd in 1 week 2. Bmet in 1 week to reassess K and Creatinine  Discharge Diagnoses:    Acute Bronchitis   Acute Diastolic CHF   DM (diabetes mellitus)   Thyroid disease   Neuropathy   Hypokalemia   Chronic renal insufficiency, stage III (moderate)   Hypertension, malignant   Obesity   Discharge Condition: stable  Diet recommendation:low sodium  Filed Weights   06/15/13 0229 06/16/13 0500 06/17/13 0552  Weight: 89.6 kg (197 lb 8.5 oz) 89.1 kg (196 lb 6.9 oz) 83.598 kg (184 lb 4.8 oz)    History of present illness:  Amanda Cunningham is a 80 y.o. female with Past medical history of diabetes, hypertension, hypothyroidism, peripheral vascular disease, chronic kidney disease, neuropathy.  The patient presented with complaints of cough that has been ongoing since last 3 days associated with yellowish expectoration without any blood. This was associated with progressively worsening shortness of breath.  She mentions she has chronic shortness of breath at her baseline but does not use any oxygen.  She also complained of some fever and chills without any chest pain. She was nauseated but no vomiting or aspiration.  No abdominal pain no recent change in her medication no sick contacts no recent hospitalization.  She has chronic lower extremity swelling and chronic lower extremity pain which he thinks is due to neuropathy.  She has diabetes since last 10 years.   Hospital Course:  Acute bronchitis  -repeat CXR without evidence of pneumonia  -treated with IV ceftriaxone then transitioned to PO levaquin -clinically improving, continue anti-tussives  -influenza PCR, urine Ags negative  -clinically improving  Acute on chronic diastolic CHF  -improving   -cut down lasix due to bump in creatinine, has CKD 3 at baseline  -2D ECHO with preserved EF of 60-65% -discharged home on 20mg  Po lasix, will need Bmet in 1 week to determine need for KCL and reassess Creatinine.  Malignant hypertension  -stable, continue norvasc, clonidine, hydralazine, labetalol, lasix   Diabetes mellitus  -stable, resume oral hypoglycemics.   Hypothyroidism  -continue Synthroid    Procedures:  ECHO Study Conclusions: Left ventricle: The cavity size was normal. Wall thickness was increased in a pattern of moderate LVH. Systolic function was normal. The estimated ejection fraction was in the range of 60% to 65%. Wall motion was normal; there were no regional wall motion abnormalities.   Discharge Exam: Filed Vitals:   06/17/13 0825  BP: 156/63  Pulse:   Temp:   Resp:     General: AAOx3 Cardiovascular: S1S2/RRR Respiratory: CTAB  Discharge Instructions You were cared for by a hospitalist during your hospital stay. If you have any questions about your discharge medications or the care you received while you were in the hospital after you are discharged, you can call the unit and asked to speak with the hospitalist on call if the hospitalist that took care of you is not available. Once you are discharged, your primary care physician will handle any further medical issues. Please note that NO REFILLS for any discharge medications will be authorized once you are discharged, as it is imperative that you return to your primary care physician (or establish a relationship with a primary care physician if you do not have one) for your aftercare needs so that they  can reassess your need for medications and monitor your lab values.  Discharge Orders   Future Appointments Provider Department Dept Phone   06/27/2013 12:15 PM Dorena Cookey, MD [facility] [place]   Future Orders Complete By Expires   Diet - low sodium heart healthy  As  directed    Diet Carb Modified  As directed    Increase activity slowly  As directed        Medication List    STOP taking these medications       hydrochlorothiazide 12.5 MG capsule  Commonly known as:  MICROZIDE     KLOR-CON M20 20 MEQ tablet  Generic drug:  potassium chloride SA      TAKE these medications       amLODipine 10 MG tablet  Commonly known as:  NORVASC  Take 1 tablet (10 mg total) by mouth 2 (two) times daily.     aspirin 81 MG tablet  Take 1 tablet (81 mg total) by mouth daily.     cloNIDine 0.2 MG tablet  Commonly known as:  CATAPRES  Take 1 tablet (0.2 mg total) by mouth 2 (two) times daily.     clopidogrel 75 MG tablet  Commonly known as:  PLAVIX  Take 75 mg by mouth daily.     dorzolamide-timolol 22.3-6.8 MG/ML ophthalmic solution  Commonly known as:  COSOPT  Place 1 drop into both eyes at bedtime.     ferrous sulfate 325 (65 FE) MG tablet  Take 325 mg by mouth daily with breakfast.     furosemide 20 MG tablet  Commonly known as:  LASIX  Take 1 tablet (20 mg total) by mouth daily.     glimepiride 2 MG tablet  Commonly known as:  AMARYL  Take 1 mg by mouth daily with breakfast.     hydrALAZINE 100 MG tablet  Commonly known as:  APRESOLINE  Take 1 tablet (100 mg total) by mouth 2 (two) times daily.     labetalol 200 MG tablet  Commonly known as:  NORMODYNE  Take 1 tablet (200 mg total) by mouth 2 (two) times daily.     levofloxacin 250 MG tablet  Commonly known as:  LEVAQUIN  Take 1 tablet (250 mg total) by mouth daily. For 2 days     levothyroxine 150 MCG tablet  Commonly known as:  SYNTHROID, LEVOTHROID  Take 150 mcg by mouth daily before breakfast.     Melatonin 1 MG Tabs  Take 1 mg by mouth at bedtime as needed (sleep).     pantoprazole 40 MG tablet  Commonly known as:  PROTONIX  Take 40 mg by mouth daily.     PRESERVISION/LUTEIN Caps  Take 1 capsule by mouth 2 (two) times daily.     TRAVATAN 0.004 % ophthalmic solution   Generic drug:  travoprost (benzalkonium)  Place 1 drop into both eyes at bedtime.       Allergies  Allergen Reactions  . Iodine Hives and Shortness Of Breath  . Tape Rash    Plastic tape       Follow-up Information   Follow up with TODD,JEFFREY ALLEN, MD In 1 week.   Specialty:  Family Medicine   Contact information:   [place] [address] [phone]        The results of significant diagnostics from this hospitalization (including imaging, microbiology, ancillary and laboratory) are listed below for reference.    Significant Diagnostic Studies: Dg  Chest 2 View  06/16/2013   CLINICAL DATA:  FU suspect pneumonia  EXAM: CHEST  2 VIEW  COMPARISON:  DG CHEST 1V PORT dated 06/14/2013  FINDINGS: Low lung volumes. Cardiac silhouette is enlarged. Mild prominence of interstitial markings. Slight improved aeration left lung base. No new focal regions of consolidation or new focal infiltrates. No acute osseous abnormalities, mild degenerative changes within the shoulders.  IMPRESSION: Pulmonary vascular congestion.  Improved aeration left lung base, decreased atelectasis.   Electronically Signed   By: Margaree Mackintosh M.D.   On: 06/16/2013 08:52   Dg Chest Port 1 View  06/14/2013   CLINICAL DATA:  Shortness of breath and fever.  EXAM: PORTABLE CHEST - 1 VIEW  COMPARISON:  DG CHEST 2 VIEW dated 07/23/2010  FINDINGS: Cardiac silhouette is at least mildly enlarged, mediastinal silhouette is nonsuspicious, moderately calcified aortic knob. Central pulmonary vasculature congestion. Similar mild chronic interstitial prominence, elevated right hemidiaphragm. Strandy densities in left lung base. No pneumothorax.  Surgical clips in left breast. Osseous structures are nonsuspicious. Multiple EKG lines overlie the patient and may obscure subtle underlying pathology.  IMPRESSION: Stable cardiomegaly, central pulmonary vasculature congestion and left lung base atelectasis.    Electronically Signed   By: Elon Alas   On: 06/14/2013 23:43    Microbiology: Recent Results (from the past 240 hour(s))  CULTURE, BLOOD (ROUTINE X 2)     Status: None   Collection Time    06/14/13 11:15 PM      Result Value Ref Range Status   Specimen Description BLOOD RIGHT ARM   Final   Special Requests BOTTLES DRAWN AEROBIC AND ANAEROBIC 4CC   Final   Culture  Setup Time     Final   Value: 06/15/2013 04:27     Performed at [facility]   Culture     Final   Value:        BLOOD CULTURE RECEIVED NO GROWTH TO DATE CULTURE WILL BE HELD FOR 5 DAYS BEFORE ISSUING A FINAL NEGATIVE REPORT     Performed at [facility]   Report Status PENDING   Incomplete  CULTURE, BLOOD (ROUTINE X 2)     Status: None   Collection Time    06/14/13 11:30 PM      Result Value Ref Range Status   Specimen Description BLOOD RIGHT HAND   Final   Special Requests     Final   Value: BOTTLES DRAWN AEROBIC AND ANAEROBIC 6CC IMMUNOCOMPROMISED   Culture  Setup Time     Final   Value: 06/15/2013 04:27     Performed at [facility]   Culture     Final   Value:        BLOOD CULTURE RECEIVED NO GROWTH TO DATE CULTURE WILL BE HELD FOR 5 DAYS BEFORE ISSUING A FINAL NEGATIVE REPORT     Performed at [facility]   Report Status PENDING   Incomplete  URINE CULTURE     Status: None   Collection Time    06/14/13 11:56 PM      Result Value Ref Range Status   Specimen Description URINE, RANDOM   Final   Special Requests Immunocompromised   Final   Culture  Setup Time     Final   Value: 06/15/2013 04:06     Performed at [facility]     Final   Value: NO GROWTH     Performed at [facility]   Culture     Final   Value: NO GROWTH     Performed at [facility]   Report Status 06/16/2013 FINAL   Final  CULTURE, EXPECTORATED SPUTUM-ASSESSMENT     Status: None   Collection Time    06/15/13  8:36 AM      Result Value Ref Range Status    Specimen Description SPUTUM   Final   Special Requests NONE   Final   Sputum evaluation     Final   Value: THIS SPECIMEN IS ACCEPTABLE. RESPIRATORY CULTURE REPORT TO FOLLOW.   Report Status 06/15/2013 FINAL   Final  CULTURE, RESPIRATORY (NON-EXPECTORATED)     Status: None   Collection Time    06/15/13  8:36 AM      Result Value Ref Range Status   Specimen Description SPUTUM   Final   Special Requests NONE   Final   Gram Stain     Final   Value: RARE WBC PRESENT,BOTH PMN AND MONONUCLEAR     NO SQUAMOUS EPITHELIAL CELLS SEEN     RARE GRAM POSITIVE COCCI IN PAIRS     Performed at [facility]   Culture     Final   Value: NORMAL OROPHARYNGEAL FLORA     Performed at [facility]   Report Status 06/17/2013 FINAL   Final     Labs: Basic Metabolic Panel:  Recent Labs Lab 06/14/13 2315 06/15/13 0513 06/16/13 0322 06/17/13 0450  NA 139 139 140 140  K 3.4* 3.6* 3.7 4.7  CL 102 104 104 104  CO2 20 22 23 23   GLUCOSE 170* 145* 99 111*  BUN 29* 29* 35* 36*  CREATININE 1.37* 1.51* 1.93* 1.89*  CALCIUM 8.2* 8.5 8.2* 8.6   Liver Function Tests:  Recent Labs Lab 06/14/13 2315 06/15/13 0513  AST 23 20  ALT 24 24  ALKPHOS 133* 128*  BILITOT 0.4 0.3  PROT 6.3 5.9*  ALBUMIN 2.7* 2.5*    Recent Labs Lab 06/14/13 2315  LIPASE 31   No results found for this basename: AMMONIA,  in the last 168 hours CBC:  Recent Labs Lab 06/14/13 2315 06/15/13 0513 06/16/13 0322  WBC 19.6* 23.1* 13.5*  NEUTROABS 18.0*  --   --   HGB 9.2* 9.2* 8.4*  HCT 29.4* 29.5* 27.5*  MCV 78.0 78.5 79.0  PLT 279 279 267   Cardiac Enzymes:  Recent Labs Lab 06/15/13 0520  TROPONINI <0.30   BNP: BNP (last 3 results)  Recent Labs  04/25/13 1232 06/14/13 2315  PROBNP 159.0* 1982.0*   CBG:  Recent Labs Lab 06/16/13 0737 06/16/13 1204 06/16/13 1706 06/16/13 2053 06/17/13 0732  GLUCAP 77 125* 86 140* 109*       Signed:  Domenic Polite  [facility] 06/17/2013, 1:35 PM

## 2013-06-20 ENCOUNTER — Telehealth: Payer: Self-pay | Admitting: Family Medicine

## 2013-06-20 NOTE — Telephone Encounter (Signed)
[facility] Triage Call Report Triage Record Num: [id number] Operator: Genevieve Norlander Patient Name: Amanda Cunningham Call Date & Time: 06/19/2013 8:02:16AM Patient Phone: [phone] PCP: Jory Ee. Todd Patient Gender: Female PCP Fax : [phone] Patient DOB: 01-31-1920 Practice Name: [facility] Reason for Call: Caller: Margo/Other; PCP: Stevie Kern (Family Practice); CB#: [phone]; Call regarding Constipation - recent discharge from hospital (5/8) for bronchitis; Last bowel movement 06/14/13; took prune juice and suppositories and got a "couple hard lumps" small black pellet sized stools on 5/9; states is on a fluid restriction diet; headache, nausea; finished antibiotic earlier today - levoquin; afebrile; cough; will try care advice and call back if no relief; Guideline: Constipation with disposition of call provider within 72 hours for constipation began within one week of starting new prescription; Appt? no. Protocol(s) Used: Constipation Recommended Outcome per Protocol: Call Provider within 72 Hours Reason for Outcome: Constipation began within one week of starting new prescription, nonprescription or alternative medicine/therapy Care Advice: ~ Call provider if symptoms worsen or new symptoms develop. Speak with provider during regular office hours to review medication(s). Many medications (iron supplements, antidepressants, diuretics, antacids containing calcium) can contribute to constipation. ~ ~ SYMPTOM / CONDITION MANAGEMENT ~ CAUTIONS Medication Advice: - Discontinue all nonprescription and alternative medications, especially stimulants, until evaluated by provider. - Take prescribed medications as directed, following label instructions for the medication. - Do not change medications or dosing regimen until provider is consulted. - Know possible side effects of medication and what to do if they occur. - Tell provider all prescription, nonprescription or  alternative medications that you take ~ Constipation Care Measures: - Drink 8 to 10 glasses of liquid per day, more if breastfeeding. - Drink warm water or coffee early in the morning. - Gradually increase dietary fiber (fresh fruits/vegetables, whole grain bread and cereals). - As tolerated, walk 30 minutes at a steady pace daily. - Consider nonprescription stool softeners (Colace) per label, pharmacist or provider recommendations. Stool softners are not habit forming as some stimulant laxatives may become. - Consider nonprescription bulk forming laxatives (such as Metamucil, FiberCon, Citrucel, etc.); follow package directions. - Avoid routine use of strong laxatives/enemas/suppositories unless ordered by provider. - Do not delay having bowel movement when having urge. - Keep a routine; attempt bowel movement within half hour after a meal or after some exercise. ~ 06/19/2013 8:21:18AM Page 1 of 1 [email or web address]

## 2013-06-21 LAB — CULTURE, BLOOD (ROUTINE X 2)
CULTURE: NO GROWTH
Culture: NO GROWTH

## 2013-06-22 ENCOUNTER — Telehealth: Payer: Self-pay | Admitting: Family Medicine

## 2013-06-22 NOTE — Telephone Encounter (Signed)
Patient Information:  Caller Name: Lynnzie  Phone: [phone]  Patient: Amanda Cunningham, Amanda Cunningham  Gender: Female  DOB: Jan 24, 1920  Age: 80 Years  PCP: Stevie Kern [facility])  Office Follow Up:  Does the office need to follow up with this patient?: No  Instructions For The Office: N/A  RN Note:  Care Advice per Guidelines. Pt. taking one Colace daily. She will increase to tid per package directions. Will try to increase activities now that she is feeling better. Will try Miralax, MOM or Prune juice. Call if no improvement.  Symptoms  Reason For Call & Symptoms: Hospitalized with  bronchitis on 06/15/13-06/17/13. Pt. has hx of CHF. Given Lasix in  the hospital and sent home on it. Pt. began to have constipation upon discharge. Pt. also takes Iron. Activity level has slowed.  Reviewed Health History In EMR: Yes  Reviewed Medications In EMR: Yes  Reviewed Allergies In EMR: Yes  Reviewed Surgeries / Procedures: No  Date of Onset of Symptoms: 06/17/2013  Guideline(s) Used:  Constipation  Disposition Per Guideline:   Home Care  Reason For Disposition Reached:   Mild constipation  Advice Given:  General Constipation Instructions:  Drink adequate liquids.  Exercise regularly (even a daily 15 minute walk!).  Get into a rhythm - try to have a BM at the same time each day.  Don't ignore your body's signals to have a BM.  High Fiber Diet:  Try to eat fresh fruit and vegetables at each meal (peas, prunes, citrus, apples, beans, corn).  Eat more grain foods (bran flakes, bran muffins, graham crackers, oatmeal, brown rice, and whole wheat bread). Popcorn is a source of fiber.  Liquids:  Prune juice is a natural laxative.  Get into a rhythm:   Try to have a BM at the same time every day. The best time is about 30-60 minutes after breakfast or another meal (Reason: natural increased intestinal activity).  Do not ignore your body's signals to have a BM.  Osmotic Laxatives:  Miralax  (polyethylene glycol 3350): Miralax is an "osmotic" agent which means that it binds water and causes water to be retained within the stool. You can use this laxative to treat occasional constipation. Do not use for more than 2 weeks without approval from your doctor. Generally, Miralax produces a bowel movement in 1 to 3 days. Side effects include diarrhea (especially at higher doses). If you are pregnant, discuss with your doctor before using. Available in the [place].  Call Back If:  Constipation continues (i.e., less than 3 BMs / week or straining more than 25% of the time) after following care advice for constipation for 2 weeks  You become worse  Patient Will Follow Care Advice:  YES

## 2013-06-22 NOTE — Telephone Encounter (Signed)
FYI

## 2013-06-23 NOTE — Telephone Encounter (Signed)
noted 

## 2013-06-27 ENCOUNTER — Encounter: Payer: Self-pay | Admitting: Family Medicine

## 2013-06-27 ENCOUNTER — Ambulatory Visit (INDEPENDENT_AMBULATORY_CARE_PROVIDER_SITE_OTHER): Payer: Medicare Other | Admitting: Family Medicine

## 2013-06-27 VITALS — BP 110/78 | Temp 98.5°F | Wt 198.0 lb

## 2013-06-27 DIAGNOSIS — R0602 Shortness of breath: Secondary | ICD-10-CM

## 2013-06-27 DIAGNOSIS — I5031 Acute diastolic (congestive) heart failure: Secondary | ICD-10-CM

## 2013-06-27 DIAGNOSIS — D649 Anemia, unspecified: Secondary | ICD-10-CM

## 2013-06-27 DIAGNOSIS — N183 Chronic kidney disease, stage 3 unspecified: Secondary | ICD-10-CM

## 2013-06-27 DIAGNOSIS — I509 Heart failure, unspecified: Secondary | ICD-10-CM

## 2013-06-27 DIAGNOSIS — I10 Essential (primary) hypertension: Secondary | ICD-10-CM

## 2013-06-27 LAB — CBC WITH DIFFERENTIAL/PLATELET
BASOS PCT: 0.2 % (ref 0.0–3.0)
Basophils Absolute: 0 10*3/uL (ref 0.0–0.1)
EOS ABS: 0.3 10*3/uL (ref 0.0–0.7)
EOS PCT: 2.5 % (ref 0.0–5.0)
HCT: 33.7 % — ABNORMAL LOW (ref 36.0–46.0)
Hemoglobin: 10.9 g/dL — ABNORMAL LOW (ref 12.0–15.0)
LYMPHS PCT: 9.5 % — AB (ref 12.0–46.0)
Lymphs Abs: 1 10*3/uL (ref 0.7–4.0)
MCHC: 32.4 g/dL (ref 30.0–36.0)
MCV: 76.7 fl — ABNORMAL LOW (ref 78.0–100.0)
Monocytes Absolute: 0.7 10*3/uL (ref 0.1–1.0)
Monocytes Relative: 6.3 % (ref 3.0–12.0)
NEUTROS PCT: 81.5 % — AB (ref 43.0–77.0)
Neutro Abs: 8.8 10*3/uL — ABNORMAL HIGH (ref 1.4–7.7)
Platelets: 425 10*3/uL — ABNORMAL HIGH (ref 150.0–400.0)
RBC: 4.4 Mil/uL (ref 3.87–5.11)
RDW: 18 % — ABNORMAL HIGH (ref 11.5–15.5)
WBC: 10.8 10*3/uL — AB (ref 4.0–10.5)

## 2013-06-27 LAB — BASIC METABOLIC PANEL
BUN: 48 mg/dL — ABNORMAL HIGH (ref 6–23)
CALCIUM: 8.7 mg/dL (ref 8.4–10.5)
CO2: 24 mEq/L (ref 19–32)
CREATININE: 2 mg/dL — AB (ref 0.4–1.2)
Chloride: 101 mEq/L (ref 96–112)
GFR: 25.06 mL/min — AB (ref >60.00)
Glucose, Bld: 132 mg/dL — ABNORMAL HIGH (ref 70–99)
Potassium: 3.6 mEq/L (ref 3.5–5.1)
Sodium: 137 mEq/L (ref 135–145)

## 2013-06-27 LAB — TSH: TSH: 2.7 u[IU]/mL (ref 0.35–4.50)

## 2013-06-27 LAB — BRAIN NATRIURETIC PEPTIDE: PRO B NATRI PEPTIDE: 66 pg/mL (ref 0.0–100.0)

## 2013-06-27 NOTE — Progress Notes (Signed)
Pre visit review using our clinic review tool, if applicable. No additional management support is needed unless otherwise documented below in the visit note. 

## 2013-06-27 NOTE — Patient Instructions (Signed)
Continue your current medications  Complete no salt diet  Walk 30 minutes daily  Labs today

## 2013-06-27 NOTE — Progress Notes (Signed)
   Subjective:    Patient ID: Amanda Cunningham, female    DOB: 1919/04/11, 80 y.o.   MRN: [id number]  HPI  Amanda Cunningham is a 80 year old married female nonsmoker who comes in today having been hospitalized in May 5 to the eighth for congestive heart failure  She said she was admitted for acute shortness of breath the going on for about 3 days. Her chest x-ray showed pneumonia however she was 2 with antibiotics. She did have diastolic congestive heart failure. Echo looked fairly normal she was diuresed 20 pounds according to her. Weight today 198. She just feels well except she is still weak and nauseated. They stopped the hydrochlorothiazide and the potassium supplement. All her other medications will remain the same.  Review of Systems Review of systems otherwise negative    Objective:   Physical Exam Well-developed well-nourished female no acute distress vital signs stable BP 110/80 pulse is 70 and regular weight 198  Cardiopulmonary exam normal except for crackles right and left lower extremity       Assessment & Plan:  Congestive heart failure with persistent crackles......... check labs and adjust medications upward

## 2013-06-28 ENCOUNTER — Telehealth: Payer: Self-pay | Admitting: Family Medicine

## 2013-06-28 LAB — POCT URINALYSIS DIPSTICK
Bilirubin, UA: NEGATIVE
Blood, UA: NEGATIVE
Glucose, UA: NEGATIVE
Ketones, UA: NEGATIVE
NITRITE UA: NEGATIVE
Spec Grav, UA: 1.015
UROBILINOGEN UA: 0.2
pH, UA: 5.5

## 2013-06-28 NOTE — Telephone Encounter (Signed)
Left detailed message on machine for patient with lab results.  Copy faxed per patient's request.

## 2013-06-28 NOTE — Telephone Encounter (Signed)
Pt is needing a her labs results faxed to Dr. Huel Coventry, at the [facility] pt states she has an appt with him on 5/225/15 in [place],  [place] to [phone].

## 2013-07-01 DIAGNOSIS — C50919 Malignant neoplasm of unspecified site of unspecified female breast: Secondary | ICD-10-CM | POA: Diagnosis not present

## 2013-07-01 DIAGNOSIS — D469 Myelodysplastic syndrome, unspecified: Secondary | ICD-10-CM | POA: Diagnosis not present

## 2013-07-11 ENCOUNTER — Ambulatory Visit (INDEPENDENT_AMBULATORY_CARE_PROVIDER_SITE_OTHER): Payer: Medicare Other | Admitting: Family Medicine

## 2013-07-11 ENCOUNTER — Encounter: Payer: Self-pay | Admitting: Family Medicine

## 2013-07-11 VITALS — BP 148/60 | Temp 98.4°F | Wt 197.0 lb

## 2013-07-11 DIAGNOSIS — N183 Chronic kidney disease, stage 3 unspecified: Secondary | ICD-10-CM

## 2013-07-11 DIAGNOSIS — I5032 Chronic diastolic (congestive) heart failure: Secondary | ICD-10-CM | POA: Diagnosis not present

## 2013-07-11 DIAGNOSIS — I509 Heart failure, unspecified: Secondary | ICD-10-CM | POA: Diagnosis not present

## 2013-07-11 NOTE — Patient Instructions (Signed)
Continue your current medications  If she gained 2 pounds double the Lasix for couple days  Return in 4 weeks for followup sooner if any problems

## 2013-07-11 NOTE — Progress Notes (Signed)
Pre visit review using our clinic review tool, if applicable. No additional management support is needed unless otherwise documented below in the visit note. 

## 2013-07-11 NOTE — Progress Notes (Signed)
   Subjective:    Patient ID: Amanda Cunningham, female    DOB: 1919-04-25, 80 y.o.   MRN: [id number]  HPI Amanda Cunningham is a 80 year old married female nonsmoker who comes in today for evaluation of chronic congestive heart failure  We saw her a couple weeks ago after she was discharged from the hospital. She was it made it from May 60 with diastolic heart failure  She's on Lasix 20 mg a day and daily weights which are steady at 190 pounds at home. Weight here today 197 fully clothed at  She says she feels well although she is fatigued and has no energy.  She also has chronic renal insufficiency secondary to congestive heart failure. She was due to see a nephrologist in [facility] however she's moving here and would like to see somebody here   Review of Systems    review of systems otherwise negative except she would like to see a nephrologist Objective:   Physical Exam Well-developed well-nourished female no acute distress vital signs stable she is afebrile BP 140/60  Cardiac exam regular sinus rhythm no murmur lung exam crackles left base  Trace peripheral edema to 1+       Assessment & Plan:  Chronic diastolic congestive heart failure....... check labs continue current therapy followup in one month.  Chronic renal insufficiency secondary to heart failure....... nephrology consult recommended by the hospital people

## 2013-07-12 DIAGNOSIS — H18899 Other specified disorders of cornea, unspecified eye: Secondary | ICD-10-CM | POA: Diagnosis not present

## 2013-07-12 DIAGNOSIS — H4011X0 Primary open-angle glaucoma, stage unspecified: Secondary | ICD-10-CM | POA: Diagnosis not present

## 2013-07-12 DIAGNOSIS — Z961 Presence of intraocular lens: Secondary | ICD-10-CM | POA: Diagnosis not present

## 2013-07-12 DIAGNOSIS — H18599 Other hereditary corneal dystrophies, unspecified eye: Secondary | ICD-10-CM | POA: Diagnosis not present

## 2013-07-12 DIAGNOSIS — H353290 Exudative age-related macular degeneration, unspecified eye, stage unspecified: Secondary | ICD-10-CM | POA: Diagnosis not present

## 2013-07-12 DIAGNOSIS — H409 Unspecified glaucoma: Secondary | ICD-10-CM | POA: Diagnosis not present

## 2013-07-13 DIAGNOSIS — H905 Unspecified sensorineural hearing loss: Secondary | ICD-10-CM | POA: Diagnosis not present

## 2013-07-13 DIAGNOSIS — H9190 Unspecified hearing loss, unspecified ear: Secondary | ICD-10-CM | POA: Diagnosis not present

## 2013-07-14 ENCOUNTER — Telehealth: Payer: Self-pay | Admitting: Family Medicine

## 2013-07-14 MED ORDER — FUROSEMIDE 20 MG PO TABS: 20.0000 mg | ORAL_TABLET | Freq: Every day | ORAL | Status: DC

## 2013-07-14 NOTE — Telephone Encounter (Signed)
Pt is needing new rx furosemide (LASIX) 20 MG tablet, send to [facility].

## 2013-07-26 DIAGNOSIS — L608 Other nail disorders: Secondary | ICD-10-CM | POA: Diagnosis not present

## 2013-07-26 DIAGNOSIS — E1159 Type 2 diabetes mellitus with other circulatory complications: Secondary | ICD-10-CM | POA: Diagnosis not present

## 2013-07-26 DIAGNOSIS — I739 Peripheral vascular disease, unspecified: Secondary | ICD-10-CM | POA: Diagnosis not present

## 2013-07-26 DIAGNOSIS — M19079 Primary osteoarthritis, unspecified ankle and foot: Secondary | ICD-10-CM | POA: Diagnosis not present

## 2013-08-08 ENCOUNTER — Encounter: Payer: Self-pay | Admitting: Family Medicine

## 2013-08-08 ENCOUNTER — Ambulatory Visit (INDEPENDENT_AMBULATORY_CARE_PROVIDER_SITE_OTHER): Payer: Medicare Other | Admitting: Family Medicine

## 2013-08-08 VITALS — BP 140/70 | Temp 97.6°F | Wt 197.0 lb

## 2013-08-08 DIAGNOSIS — E860 Dehydration: Secondary | ICD-10-CM

## 2013-08-08 DIAGNOSIS — I509 Heart failure, unspecified: Secondary | ICD-10-CM

## 2013-08-08 DIAGNOSIS — N183 Chronic kidney disease, stage 3 unspecified: Secondary | ICD-10-CM

## 2013-08-08 DIAGNOSIS — I10 Essential (primary) hypertension: Secondary | ICD-10-CM

## 2013-08-08 DIAGNOSIS — I5032 Chronic diastolic (congestive) heart failure: Secondary | ICD-10-CM | POA: Diagnosis not present

## 2013-08-08 LAB — BASIC METABOLIC PANEL
BUN: 39 mg/dL — ABNORMAL HIGH (ref 6–23)
CALCIUM: 8.6 mg/dL (ref 8.4–10.5)
CO2: 23 mEq/L (ref 19–32)
Chloride: 107 mEq/L (ref 96–112)
Creatinine, Ser: 1.6 mg/dL — ABNORMAL HIGH (ref 0.4–1.2)
GFR: 32.27 mL/min — AB (ref >60.00)
Glucose, Bld: 162 mg/dL — ABNORMAL HIGH (ref 70–99)
POTASSIUM: 3.1 meq/L — AB (ref 3.5–5.1)
SODIUM: 141 meq/L (ref 135–145)

## 2013-08-08 NOTE — Patient Instructions (Signed)
Continue your current medications  Followup in 6 weeks...........Marland Kitchen mid-August

## 2013-08-08 NOTE — Progress Notes (Signed)
Pre visit review using our clinic review tool, if applicable. No additional management support is needed unless otherwise documented below in the visit note. 

## 2013-08-08 NOTE — Progress Notes (Signed)
   Subjective:    Patient ID: Amanda Cunningham, female    DOB: Apr 19, 1919, 80 y.o.   MRN: [id number]  HPI Amanda Cunningham is a 80 year old married female nonsmoker who comes in today for evaluation of diastolic heart failure, diabetes type 2  She states overall she feels well her weight is steady it 197 pounds. BP today 140/70. She states other than fatigue she otherwise feels the same. She is weighing herself daily at home. She knows to take an extra dose of Lasix if her weight goes above 2 pounds   Review of Systems Review of systems negative,,,,, except they're going to [place] for a [employer] for her husband sister. The be back in a couple weeks    Objective:   Physical Exam  Well-developed well-nourished female no acute distress vital signs stable she's afebrile BP 140/90 examination lungs shows crackles in the right and left lower bases been consistent throughout. Cardiac exam negative      Assessment & Plan:  Chronic diastolic heart failure stable........... continue current medications  Diabetes type 2........... fasting blood sugars in the 1:15 range........ continue current meds........ BP check weekly.Marland Kitchen

## 2013-08-09 MED ORDER — POTASSIUM CHLORIDE ER 10 MEQ PO TBCR: 10.0000 meq | EXTENDED_RELEASE_TABLET | Freq: Every day | ORAL | Status: DC

## 2013-08-09 NOTE — Addendum Note (Signed)
Addended by: Monico Blitz T on: 08/09/2013 10:22 AM   Modules accepted: Orders

## 2013-08-09 NOTE — Addendum Note (Signed)
Addended by: Monico Blitz T on: 08/09/2013 10:23 AM   Modules accepted: Orders

## 2013-08-15 ENCOUNTER — Other Ambulatory Visit: Payer: Self-pay | Admitting: Family Medicine

## 2013-08-16 ENCOUNTER — Other Ambulatory Visit (INDEPENDENT_AMBULATORY_CARE_PROVIDER_SITE_OTHER): Payer: Medicare Other

## 2013-08-16 ENCOUNTER — Other Ambulatory Visit: Payer: Medicare Other

## 2013-08-16 DIAGNOSIS — N183 Chronic kidney disease, stage 3 unspecified: Secondary | ICD-10-CM | POA: Diagnosis not present

## 2013-08-16 DIAGNOSIS — I10 Essential (primary) hypertension: Secondary | ICD-10-CM | POA: Diagnosis not present

## 2013-08-16 LAB — BASIC METABOLIC PANEL
BUN: 43 mg/dL — ABNORMAL HIGH (ref 6–23)
CO2: 25 meq/L (ref 19–32)
CREATININE: 1.5 mg/dL — AB (ref 0.4–1.2)
Calcium: 8.9 mg/dL (ref 8.4–10.5)
Chloride: 105 mEq/L (ref 96–112)
GFR: 33.48 mL/min — ABNORMAL LOW (ref >60.00)
Glucose, Bld: 112 mg/dL — ABNORMAL HIGH (ref 70–99)
POTASSIUM: 3.9 meq/L (ref 3.5–5.1)
Sodium: 138 mEq/L (ref 135–145)

## 2013-08-25 DIAGNOSIS — R922 Inconclusive mammogram: Secondary | ICD-10-CM | POA: Diagnosis not present

## 2013-08-25 DIAGNOSIS — Z1231 Encounter for screening mammogram for malignant neoplasm of breast: Secondary | ICD-10-CM | POA: Diagnosis not present

## 2013-09-19 ENCOUNTER — Ambulatory Visit (INDEPENDENT_AMBULATORY_CARE_PROVIDER_SITE_OTHER): Payer: Medicare Other | Admitting: Family Medicine

## 2013-09-19 ENCOUNTER — Encounter: Payer: Self-pay | Admitting: Family Medicine

## 2013-09-19 VITALS — BP 110/70 | Temp 97.7°F | Wt 198.0 lb

## 2013-09-19 DIAGNOSIS — I5032 Chronic diastolic (congestive) heart failure: Secondary | ICD-10-CM

## 2013-09-19 DIAGNOSIS — R928 Other abnormal and inconclusive findings on diagnostic imaging of breast: Secondary | ICD-10-CM

## 2013-09-19 DIAGNOSIS — C50919 Malignant neoplasm of unspecified site of unspecified female breast: Secondary | ICD-10-CM

## 2013-09-19 DIAGNOSIS — I10 Essential (primary) hypertension: Secondary | ICD-10-CM | POA: Diagnosis not present

## 2013-09-19 DIAGNOSIS — I509 Heart failure, unspecified: Secondary | ICD-10-CM

## 2013-09-19 DIAGNOSIS — C50912 Malignant neoplasm of unspecified site of left female breast: Secondary | ICD-10-CM

## 2013-09-19 NOTE — Progress Notes (Signed)
Pre visit review using our clinic review tool, if applicable. No additional management support is needed unless otherwise documented below in the visit note. 

## 2013-09-19 NOTE — Patient Instructions (Signed)
Continue your current medications  We will get you set up to see the folks at the breast Center for further evaluation

## 2013-09-19 NOTE — Progress Notes (Signed)
   Subjective:    Patient ID: [place] Pink, female    DOB: March 02, 1919, 80 y.o.   MRN: [id number]  HPI Laneka is an 80 year old female married nonsmoker who comes in today for followup of diastolic congestive heart failure, hypertension, and a new problem of a left breast mass  In 2008 she had a lumpectomy and postop oral treatment for a localized breast cancer. She did well and had no complications. She had a mammogram this past July 15 in [place] where she had her original treatment. She recently has moved to [place] does live with her daughter.  Mammogram showed an abnormal lesion probably benign but needs followup.  She says overall she with feels well. Weight steady BP steady   Review of Systems Review of systems otherwise negative    Objective:   Physical Exam  Well-developed well-nourished female no acute distress vital signs stable she is afebrile cardiopulmonary exam normal      Assessment & Plan:  Diastolic congestive heart failure stable........ continue current therapy  Hypertension at goal........ continue current therapy  Breast lump............ consult with the breast Center

## 2013-09-21 ENCOUNTER — Encounter: Payer: Self-pay | Admitting: Family Medicine

## 2013-09-29 ENCOUNTER — Other Ambulatory Visit: Payer: Self-pay | Admitting: Family Medicine

## 2013-09-29 DIAGNOSIS — R928 Other abnormal and inconclusive findings on diagnostic imaging of breast: Secondary | ICD-10-CM

## 2013-10-05 ENCOUNTER — Other Ambulatory Visit: Payer: Medicare Other

## 2013-10-10 ENCOUNTER — Ambulatory Visit
Admission: RE | Admit: 2013-10-10 | Discharge: 2013-10-10 | Disposition: A | Discharge status: Home / Self Care | Payer: Medicare Other | Source: Ambulatory Visit | Attending: Family Medicine | Admitting: Family Medicine

## 2013-10-10 DIAGNOSIS — R928 Other abnormal and inconclusive findings on diagnostic imaging of breast: Secondary | ICD-10-CM

## 2013-10-11 ENCOUNTER — Other Ambulatory Visit: Payer: Self-pay | Admitting: Family Medicine

## 2013-10-11 ENCOUNTER — Ambulatory Visit
Admission: RE | Admit: 2013-10-11 | Discharge: 2013-10-11 | Disposition: A | Discharge status: Home / Self Care | Payer: Medicare Other | Source: Ambulatory Visit | Attending: Family Medicine | Admitting: Family Medicine

## 2013-10-11 DIAGNOSIS — N63 Unspecified lump in unspecified breast: Secondary | ICD-10-CM

## 2013-10-11 DIAGNOSIS — Z853 Personal history of malignant neoplasm of breast: Secondary | ICD-10-CM | POA: Diagnosis not present

## 2013-10-11 DIAGNOSIS — N6489 Other specified disorders of breast: Secondary | ICD-10-CM | POA: Diagnosis not present

## 2013-10-12 ENCOUNTER — Telehealth: Payer: Self-pay | Admitting: Family Medicine

## 2013-10-12 NOTE — Telephone Encounter (Signed)
Pt states she is having a biopsy on her breast on 9/11 and needs to know how many days should she stop taking the rx clopidogrel (PLAVIX) 75 MG tablet before the procedure.

## 2013-10-12 NOTE — Telephone Encounter (Signed)
Patient plans on stopping her ASA 5 days before and will also stop her plavix.

## 2013-10-14 ENCOUNTER — Telehealth: Payer: Self-pay | Admitting: Family Medicine

## 2013-10-14 DIAGNOSIS — Z961 Presence of intraocular lens: Secondary | ICD-10-CM | POA: Diagnosis not present

## 2013-10-14 DIAGNOSIS — H18899 Other specified disorders of cornea, unspecified eye: Secondary | ICD-10-CM | POA: Diagnosis not present

## 2013-10-14 DIAGNOSIS — H353290 Exudative age-related macular degeneration, unspecified eye, stage unspecified: Secondary | ICD-10-CM | POA: Diagnosis not present

## 2013-10-14 DIAGNOSIS — H409 Unspecified glaucoma: Secondary | ICD-10-CM | POA: Diagnosis not present

## 2013-10-14 DIAGNOSIS — H18599 Other hereditary corneal dystrophies, unspecified eye: Secondary | ICD-10-CM | POA: Diagnosis not present

## 2013-10-14 DIAGNOSIS — H4011X0 Primary open-angle glaucoma, stage unspecified: Secondary | ICD-10-CM | POA: Diagnosis not present

## 2013-10-14 NOTE — Telephone Encounter (Signed)
Daughter states she dropped off paperwork for lift chair for pt. would like to know if it is ready for pu. -Please advise.

## 2013-10-18 ENCOUNTER — Other Ambulatory Visit: Payer: Self-pay | Admitting: Family Medicine

## 2013-10-18 DIAGNOSIS — N63 Unspecified lump in unspecified breast: Secondary | ICD-10-CM

## 2013-10-19 ENCOUNTER — Ambulatory Visit
Admission: RE | Admit: 2013-10-19 | Discharge: 2013-10-19 | Disposition: A | Discharge status: Home / Self Care | Payer: Medicare Other | Source: Ambulatory Visit | Attending: Family Medicine | Admitting: Family Medicine

## 2013-10-19 DIAGNOSIS — C50919 Malignant neoplasm of unspecified site of unspecified female breast: Secondary | ICD-10-CM | POA: Diagnosis not present

## 2013-10-19 DIAGNOSIS — N63 Unspecified lump in unspecified breast: Secondary | ICD-10-CM

## 2013-10-19 NOTE — Telephone Encounter (Signed)
Left message on machine for daughter that paperwork has not been found.  Asked daughter to bring a copy.

## 2013-10-20 ENCOUNTER — Other Ambulatory Visit: Payer: Self-pay | Admitting: Family Medicine

## 2013-10-20 DIAGNOSIS — N63 Unspecified lump in unspecified breast: Secondary | ICD-10-CM

## 2013-10-20 DIAGNOSIS — N6320 Unspecified lump in the left breast, unspecified quadrant: Secondary | ICD-10-CM

## 2013-10-21 ENCOUNTER — Ambulatory Visit
Admission: RE | Admit: 2013-10-21 | Discharge: 2013-10-21 | Disposition: A | Discharge status: Home / Self Care | Payer: Medicare Other | Source: Ambulatory Visit | Attending: Family Medicine | Admitting: Family Medicine

## 2013-10-21 DIAGNOSIS — N6320 Unspecified lump in the left breast, unspecified quadrant: Secondary | ICD-10-CM

## 2013-10-21 DIAGNOSIS — C50919 Malignant neoplasm of unspecified site of unspecified female breast: Secondary | ICD-10-CM | POA: Diagnosis not present

## 2013-10-27 DIAGNOSIS — C50219 Malignant neoplasm of upper-inner quadrant of unspecified female breast: Secondary | ICD-10-CM | POA: Diagnosis not present

## 2013-10-27 DIAGNOSIS — C50919 Malignant neoplasm of unspecified site of unspecified female breast: Secondary | ICD-10-CM | POA: Diagnosis not present

## 2013-10-31 ENCOUNTER — Telehealth: Payer: Self-pay | Admitting: *Deleted

## 2013-10-31 NOTE — Telephone Encounter (Signed)
Received referral from [facility].  Called pt and confirmed 11/04/13 appt w/ her.  Unable to mail before appt letter - gave verbal.  Unable to mail welcoming packet - gave instructions and directions.  Unable to mail intake form - placed a note for one to be given at time of check in.  Emailed Engineer, civil (consulting) at [employer] to make her aware.

## 2013-11-01 DIAGNOSIS — N2581 Secondary hyperparathyroidism of renal origin: Secondary | ICD-10-CM | POA: Diagnosis not present

## 2013-11-01 DIAGNOSIS — I129 Hypertensive chronic kidney disease with stage 1 through stage 4 chronic kidney disease, or unspecified chronic kidney disease: Secondary | ICD-10-CM | POA: Diagnosis not present

## 2013-11-01 DIAGNOSIS — I701 Atherosclerosis of renal artery: Secondary | ICD-10-CM | POA: Diagnosis not present

## 2013-11-01 DIAGNOSIS — N039 Chronic nephritic syndrome with unspecified morphologic changes: Secondary | ICD-10-CM | POA: Diagnosis not present

## 2013-11-01 DIAGNOSIS — N183 Chronic kidney disease, stage 3 unspecified: Secondary | ICD-10-CM | POA: Diagnosis not present

## 2013-11-01 DIAGNOSIS — D631 Anemia in chronic kidney disease: Secondary | ICD-10-CM | POA: Diagnosis not present

## 2013-11-04 ENCOUNTER — Encounter: Payer: Self-pay | Admitting: Hematology and Oncology

## 2013-11-04 ENCOUNTER — Ambulatory Visit (HOSPITAL_BASED_OUTPATIENT_CLINIC_OR_DEPARTMENT_OTHER): Payer: Medicare Other | Admitting: Hematology and Oncology

## 2013-11-04 ENCOUNTER — Ambulatory Visit: Payer: Medicare Other

## 2013-11-04 ENCOUNTER — Telehealth: Payer: Self-pay | Admitting: Hematology and Oncology

## 2013-11-04 VITALS — BP 179/44 | HR 64 | Temp 97.5°F | Resp 18 | Ht 61.0 in | Wt 197.1 lb

## 2013-11-04 DIAGNOSIS — C50519 Malignant neoplasm of lower-outer quadrant of unspecified female breast: Secondary | ICD-10-CM

## 2013-11-04 DIAGNOSIS — C50912 Malignant neoplasm of unspecified site of left female breast: Secondary | ICD-10-CM

## 2013-11-04 NOTE — Progress Notes (Signed)
Checked in new pt with no financial concerns at this time. Informed pt of the [employer] and gave her a flyer on what they assist with along with Raquel's card if she wants to apply for the grant and for any questions or concerns.

## 2013-11-04 NOTE — Telephone Encounter (Signed)
, °

## 2013-11-04 NOTE — Progress Notes (Signed)
[facility] CONSULT NOTE  Patient Care Team: Dorena Cookey, MD as PCP - General (Family Medicine)  CHIEF COMPLAINTS/PURPOSE OF CONSULTATION:  Newly diagnosed relapsed left breast cancer  HISTORY OF PRESENTING ILLNESS:  Amanda Cunningham 80 y.o. female is here because of recent diagnosis of left breast cancer. She was originally diagnosed with left-sided breast cancer 7 years ago in treatment. She went to treatment. With this performed left breast lumpectomy. She was recommended radiation therapy but she refused to take it. She was then put on Arimidex but after 6 months of therapy she quit taking it because of depression-related symptoms. After she stopped it, her symptoms have improved and she was doing better fairly well. She did undergo a six-month mammograms for a while. Recently she moved down to [place] to live with her daughter. She had a routine screening mammogram which identified an abnormality in the left breast. This was biopsied and it showed invasive lobular cancer grade 2 that was ER positive PR negative HER-2 negative. The size based on ultrasound was 0.52 mm. Patient reports that she does not wish to undergo any treatments given her advanced age and that she does not worry too much about her breast cancer.  I reviewed her records extensively and collaborated the history with the patient.  MEDICAL HISTORY:  Past Medical History  Diagnosis Date  . Anemia   . Glaucoma   . Diabetes mellitus   . Hypertension   . Thyroid disease   . Arthritis   . Cancer     breast  . Chronic kidney disease   . PAD (peripheral artery disease)   . Neuropathy     SURGICAL HISTORY: Past Surgical History  Procedure Laterality Date  . Total hip arthroplasty      left  . Replacement total knee      right  . Eye surgery    . Cholecystectomy    . Breast lumpectomy      left  . Ureteral stent placement      SOCIAL HISTORY: History   Social History  . Marital Status:  Married    Spouse Name: N/A    Number of Children: N/A  . Years of Education: N/A   Occupational History  . Not on file.   Social History Main Topics  . Smoking status: Never Smoker   . Smokeless tobacco: Not on file  . Alcohol Use: No  . Drug Use: No  . Sexual Activity:    Other Topics Concern  . Not on file   Social History Narrative  . No narrative on file    FAMILY HISTORY: Family History  Problem Relation Age of Onset  . Heart disease Mother   . Coronary artery disease Father   . Cancer Brother     prostate cancer    ALLERGIES:  is allergic to iodine; ciprofloxacin; codeine; sodium sulfite; and tape.  MEDICATIONS:  Current Outpatient Prescriptions  Medication Sig Dispense Refill  . amLODipine (NORVASC) 10 MG tablet Take 1 tablet (10 mg total) by mouth 2 (two) times daily.  31 tablet  3  . aspirin 81 MG tablet Take 1 tablet (81 mg total) by mouth daily.  30 tablet  1  . cloNIDine (CATAPRES) 0.2 MG tablet Take 1 tablet (0.2 mg total) by mouth 2 (two) times daily.  62 tablet  3  . clopidogrel (PLAVIX) 75 MG tablet TAKE 1 TABLET BY MOUTH DAILY  90 tablet  3  . dorzolamide-timolol (COSOPT) 22.3-6.8 MG/ML  ophthalmic solution Place 1 drop into both eyes at bedtime.      . ferrous sulfate 325 (65 FE) MG tablet Take 325 mg by mouth daily with breakfast.      . furosemide (LASIX) 20 MG tablet Take 1 tablet (20 mg total) by mouth daily.  90 tablet  3  . glimepiride (AMARYL) 2 MG tablet Take 1 mg by mouth daily with breakfast.       . glucose blood (ONE TOUCH ULTRA TEST) test strip Test once daily      . hydrALAZINE (APRESOLINE) 100 MG tablet Take 1 tablet (100 mg total) by mouth 2 (two) times daily.  90 tablet  3  . labetalol (NORMODYNE) 200 MG tablet Take 1 tablet (200 mg total) by mouth 2 (two) times daily.  200 tablet  3  . levothyroxine (SYNTHROID, LEVOTHROID) 150 MCG tablet Take 150 mcg by mouth daily before breakfast.       . Melatonin 1 MG TABS Take 1 mg by mouth at  bedtime as needed (sleep).       . Multiple Vitamins-Minerals (PRESERVISION/LUTEIN) CAPS Take 1 capsule by mouth 2 (two) times daily.       . pantoprazole (PROTONIX) 40 MG tablet Take 40 mg by mouth daily.        . travoprost, benzalkonium, (TRAVATAN) 0.004 % ophthalmic solution Place 1 drop into both eyes at bedtime.        No current facility-administered medications for this visit.    REVIEW OF SYSTEMS:   Constitutional: Fatigue, weakness and tiredness needs a wheelchair for ambulation Eyes: Denies blurriness of vision, double vision or watery eyes Ears, nose, mouth, throat, and face: Denies mucositis or sore throat Respiratory: Denies cough, dyspnea or wheezes Cardiovascular: Denies palpitation, chest discomfort or lower extremity swelling Gastrointestinal:  Denies nausea, heartburn or change in bowel habits Skin: Denies abnormal skin rashes Lymphatics: Denies new lymphadenopathy or easy bruising Neurological:Denies numbness, tingling or new weaknesses Behavioral/Psych: Mood is stable, no new changes  Breast:  Denies any palpable lumps or discharge All other systems were reviewed with the patient and are negative.  PHYSICAL EXAMINATION: ECOG PERFORMANCE STATUS: 3 - Symptomatic, >50% confined to bed  Filed Vitals:   11/04/13 1339  BP: 179/44  Pulse: 64  Temp: 97.5 F (36.4 C)  Resp: 18   Filed Weights   11/04/13 1339  Weight: 197 lb 2 oz (89.415 kg)    GENERAL:alert, no distress and comfortable SKIN: skin color, texture, turgor are normal, no rashes or significant lesions EYES: normal, conjunctiva are pink and non-injected, sclera clear OROPHARYNX:no exudate, no erythema and lips, buccal mucosa, and tongue normal  NECK: supple, thyroid normal size, non-tender, without nodularity LYMPH:  no palpable lymphadenopathy in the cervical, axillary or inguinal LUNGS: clear to auscultation and percussion with normal breathing effort HEART: Ejection systolic murmur in the mitral  area ABDOMEN:abdomen soft, non-tender and normal bowel sounds Musculoskeletal:no cyanosis of digits and no clubbing  PSYCH: alert & oriented x 3 with fluent speech NEURO: no focal motor/sensory deficits  LABORATORY DATA:  I have reviewed the data as listed Lab Results  Component Value Date   WBC 10.8* 06/27/2013   HGB 10.9* 06/27/2013   HCT 33.7* 06/27/2013   MCV 76.7* 06/27/2013   PLT 425.0* 06/27/2013   Lab Results  Component Value Date   NA 138 08/16/2013   K 3.9 08/16/2013   CL 105 08/16/2013   CO2 25 08/16/2013    RADIOGRAPHIC STUDIES: I have personally  reviewed the radiological reports and agreed with the findings in the report.  ASSESSMENT AND PLAN:  Breast cancer of lower-outer quadrant of left female breast Relapsed left breast invasive lobular cancer 0.6 cm ER 100% PR 0% HER-2/neu negative Ki-67 13% T1 B. N0 M0 stage IA  1. Amanda Cunningham is a 80 year old frail patient with multiple comorbidities who uses a wheelchair for ambulation was diagnosed with relapsed left breast cancer. I discussed with her what her goals of life are and whether she wants any treatment to her breast cancer. She was very clear in that she does not want to treat her breast cancer unless it causes symptoms or problems. I discussed the pathology report in great detail. I is healed he does the same type of breast cancer that she had 7 years ago that was treated at [facility] clinic with lumpectomy and examined soft and Arimidex. She could not tolerate the Arimidex because of depression and cloudiness in the head. She reported that after stopping the Arimidex her symptoms had gone away. She would not like to go on any medication that would do the same.  2. After a lengthy discussion, we elected to watch and monitor this with repeat ultrasound in 3 months. If the ultrasound does not show any significant change, she would like to not received any treatment.  3. I went over the risks and benefits of antiestrogen therapy and  the differences between tamoxifen and aromatase inhibitors. If she chooses to go on any therapy, I would recommend tamoxifen with all the risks that may be associated with it including the risk of blood clots, uterine bleeding, hot flashes and myalgias. I greatly doubt that she would like to go on any further breast cancer treatment. If that were the case that she should not undergo any further testing. Patient fully understands this and was happy with this decision to watch for the next 3 months and make a final disposition afterwards.    All questions were answered. The patient knows to call the clinic with any problems, questions or concerns. I spent 40 minutes counseling the patient face to face. The total time spent in the appointment was 60 minutes and more than 50% was on counseling.     Rulon Eisenmenger, MD 11/04/2013 3:55 PM

## 2013-11-04 NOTE — Assessment & Plan Note (Signed)
Relapsed left breast invasive lobular cancer 0.6 cm ER 100% PR 0% HER-2/neu negative Ki-67 13% T1 B. N0 M0 stage IA  1. Amanda Cunningham is a 80 year old frail patient with multiple comorbidities who uses a wheelchair for ambulation was diagnosed with relapsed left breast cancer. I discussed with her what her goals of life are and whether she wants any treatment to her breast cancer. She was very clear in that she does not want to treat her breast cancer unless it causes symptoms or problems. I discussed the pathology report in great detail. I is healed he does the same type of breast cancer that she had 7 years ago that was treated at [facility] clinic with lumpectomy and examined soft and Arimidex. She could not tolerate the Arimidex because of depression and cloudiness in the head. She reported that after stopping the Arimidex her symptoms had gone away. She would not like to go on any medication that would do the same.  2. After a lengthy discussion, we elected to watch and monitor this with repeat ultrasound in 3 months. If the ultrasound does not show any significant change, she would like to not received any treatment.  3. I went over the risks and benefits of antiestrogen therapy and the differences between tamoxifen and aromatase inhibitors. If she chooses to go on any therapy, I would recommend tamoxifen with all the risks that may be associated with it including the risk of blood clots, uterine bleeding, hot flashes and myalgias. I greatly doubt that she would like to go on any further breast cancer treatment. If that were the case that she should not undergo any further testing. Patient fully understands this and was happy with this decision to watch for the next 3 months and make a final disposition afterwards.

## 2013-11-07 ENCOUNTER — Other Ambulatory Visit: Payer: Self-pay | Admitting: Family Medicine

## 2013-11-08 ENCOUNTER — Telehealth: Payer: Self-pay | Admitting: *Deleted

## 2013-11-08 NOTE — Telephone Encounter (Signed)
Pt completed intake form. Original sent to HIM and copy given to Dr. Lindi Adie. Dr. Geralyn Flash written plan which was reviewed with pt at MD visit sent to HIM to be scanned as well.

## 2013-11-10 DIAGNOSIS — I739 Peripheral vascular disease, unspecified: Secondary | ICD-10-CM | POA: Diagnosis not present

## 2013-11-10 DIAGNOSIS — E1151 Type 2 diabetes mellitus with diabetic peripheral angiopathy without gangrene: Secondary | ICD-10-CM | POA: Diagnosis not present

## 2013-11-10 DIAGNOSIS — L603 Nail dystrophy: Secondary | ICD-10-CM | POA: Diagnosis not present

## 2013-11-15 ENCOUNTER — Telehealth: Payer: Self-pay | Admitting: Family Medicine

## 2013-11-15 NOTE — Telephone Encounter (Signed)
Left detailed message on machine for patient to return in November to follow up with her blood pressure.

## 2013-11-15 NOTE — Telephone Encounter (Signed)
Pt was last seen on 8-10 and no follow up appt was indicated on AVS. Pt would like rachel to check w/md to see when should she follow up

## 2013-11-17 DIAGNOSIS — N183 Chronic kidney disease, stage 3 (moderate): Secondary | ICD-10-CM | POA: Diagnosis not present

## 2013-11-23 ENCOUNTER — Ambulatory Visit (INDEPENDENT_AMBULATORY_CARE_PROVIDER_SITE_OTHER): Payer: Medicare Other

## 2013-11-23 DIAGNOSIS — Z23 Encounter for immunization: Secondary | ICD-10-CM | POA: Diagnosis not present

## 2013-11-29 ENCOUNTER — Telehealth: Payer: Self-pay

## 2013-11-29 ENCOUNTER — Other Ambulatory Visit: Payer: Self-pay | Admitting: Hematology and Oncology

## 2013-11-29 DIAGNOSIS — N183 Chronic kidney disease, stage 3 (moderate): Secondary | ICD-10-CM | POA: Diagnosis not present

## 2013-11-29 DIAGNOSIS — C50912 Malignant neoplasm of unspecified site of left female breast: Secondary | ICD-10-CM

## 2013-11-29 DIAGNOSIS — N2581 Secondary hyperparathyroidism of renal origin: Secondary | ICD-10-CM | POA: Diagnosis not present

## 2013-11-29 DIAGNOSIS — I129 Hypertensive chronic kidney disease with stage 1 through stage 4 chronic kidney disease, or unspecified chronic kidney disease: Secondary | ICD-10-CM | POA: Diagnosis not present

## 2013-11-29 DIAGNOSIS — D631 Anemia in chronic kidney disease: Secondary | ICD-10-CM | POA: Diagnosis not present

## 2013-11-29 NOTE — Telephone Encounter (Signed)
Returned pt call re: ultrasound scheduling.  Let pt know GI scheduled ultrasound on 12/17 at 1030.Let pt know I would send her a calendar with appt d/t.  Pt voiced understanding.   Calendar mailed.

## 2013-12-06 ENCOUNTER — Other Ambulatory Visit: Payer: Self-pay | Admitting: *Deleted

## 2013-12-06 MED ORDER — FUROSEMIDE 40 MG PO TABS: 40.0000 mg | ORAL_TABLET | Freq: Every day | ORAL | Status: DC

## 2013-12-12 DIAGNOSIS — I10 Essential (primary) hypertension: Secondary | ICD-10-CM | POA: Diagnosis not present

## 2013-12-12 DIAGNOSIS — M10032 Idiopathic gout, left wrist: Secondary | ICD-10-CM | POA: Diagnosis not present

## 2013-12-16 DIAGNOSIS — H1859 Other hereditary corneal dystrophies: Secondary | ICD-10-CM | POA: Diagnosis not present

## 2013-12-16 DIAGNOSIS — N2581 Secondary hyperparathyroidism of renal origin: Secondary | ICD-10-CM | POA: Diagnosis not present

## 2013-12-16 DIAGNOSIS — H3532 Exudative age-related macular degeneration: Secondary | ICD-10-CM | POA: Diagnosis not present

## 2013-12-16 DIAGNOSIS — N183 Chronic kidney disease, stage 3 (moderate): Secondary | ICD-10-CM | POA: Diagnosis not present

## 2013-12-16 DIAGNOSIS — H4011X0 Primary open-angle glaucoma, stage unspecified: Secondary | ICD-10-CM | POA: Diagnosis not present

## 2013-12-21 ENCOUNTER — Encounter: Payer: Self-pay | Admitting: Family Medicine

## 2013-12-21 ENCOUNTER — Ambulatory Visit (INDEPENDENT_AMBULATORY_CARE_PROVIDER_SITE_OTHER): Payer: Medicare Other | Admitting: Family Medicine

## 2013-12-21 VITALS — BP 110/62 | Temp 97.6°F | Wt 188.0 lb

## 2013-12-21 DIAGNOSIS — I10 Essential (primary) hypertension: Secondary | ICD-10-CM | POA: Diagnosis not present

## 2013-12-21 DIAGNOSIS — M1990 Unspecified osteoarthritis, unspecified site: Secondary | ICD-10-CM

## 2013-12-21 DIAGNOSIS — E876 Hypokalemia: Secondary | ICD-10-CM

## 2013-12-21 LAB — CBC WITH DIFFERENTIAL/PLATELET
BASOS ABS: 0 10*3/uL (ref 0.0–0.1)
Basophils Relative: 0 % (ref 0.0–3.0)
EOS ABS: 0.3 10*3/uL (ref 0.0–0.7)
Eosinophils Relative: 1.8 % (ref 0.0–5.0)
HCT: 34.8 % — ABNORMAL LOW (ref 36.0–46.0)
Hemoglobin: 11.2 g/dL — ABNORMAL LOW (ref 12.0–15.0)
Lymphs Abs: 0.8 10*3/uL (ref 0.7–4.0)
MCHC: 32.1 g/dL (ref 30.0–36.0)
MCV: 79.5 fl (ref 78.0–100.0)
Monocytes Absolute: 0.9 10*3/uL (ref 0.1–1.0)
Monocytes Relative: 5.4 % (ref 3.0–12.0)
Neutro Abs: 13.9 10*3/uL — ABNORMAL HIGH (ref 1.4–7.7)
Neutrophils Relative %: 87.9 % — ABNORMAL HIGH (ref 43.0–77.0)
PLATELETS: 328 10*3/uL (ref 150.0–400.0)
RBC: 4.38 Mil/uL (ref 3.87–5.11)
RDW: 17.4 % — AB (ref 11.5–15.5)
WBC: 15.8 10*3/uL — ABNORMAL HIGH (ref 4.0–10.5)

## 2013-12-21 LAB — URIC ACID: URIC ACID, SERUM: 10.5 mg/dL — AB (ref 2.4–7.0)

## 2013-12-21 LAB — BASIC METABOLIC PANEL
BUN: 35 mg/dL — AB (ref 6–23)
CO2: 30 mEq/L (ref 19–32)
CREATININE: 1.9 mg/dL — AB (ref 0.4–1.2)
Calcium: 7.9 mg/dL — ABNORMAL LOW (ref 8.4–10.5)
Chloride: 101 mEq/L (ref 96–112)
GFR: 26.57 mL/min — AB (ref >60.00)
GLUCOSE: 225 mg/dL — AB (ref 70–99)
Potassium: 2.9 mEq/L — ABNORMAL LOW (ref 3.5–5.1)
SODIUM: 141 meq/L (ref 135–145)

## 2013-12-21 MED ORDER — PREDNISONE 20 MG PO TABS: ORAL_TABLET | ORAL | Status: DC

## 2013-12-21 NOTE — Progress Notes (Signed)
Pre visit review using our clinic review tool, if applicable. No additional management support is needed unless otherwise documented below in the visit note. Lab Results  Component Value Date   HGBA1C 6.2 04/25/2013   HGBA1C 6.7* 05/13/2011   HGBA1C 6.7* 03/15/2010   Lab Results  Component Value Date   CREATININE 1.5* 08/16/2013

## 2013-12-21 NOTE — Patient Instructions (Signed)
Prednisone 20 mg......... 2 tabs 3 days then taper as outlined  Do not take the allopurinol nor the culture sitting  Stop the Lasix  Check your blood pressure daily in the morning  Return next Monday for follow-up sooner if any problems  Labs today...Marland KitchenMarland KitchenMarland Kitchen We will call you the results ASAP

## 2013-12-21 NOTE — Progress Notes (Signed)
   Subjective:    Patient ID: Amanda Cunningham, female    DOB: 1919/04/07, 80 y.o.   MRN: [id number]  HPI Amanda Cunningham is a 80 year old married female who comes in today accompanied by her husband for evaluation of multiple issues  I sent her to see the nephrologist Dr. Donley Cunningham. He did a complete evaluation can't her Catapres to one half tablet twice daily added 40 mg of Lasix daily because of fluid retention. She states since that time she's had loose bowel movements and has not felt well. She went to [facility] and on October 26 went to an emergency room because of pain in her left wrist. They diagnosed acute gout and put her on prednisone one tablet daily for 5 days which she took her wrist pain is better but not completely gone. She states he did some lab work but she doesn't know what the results were. They also gave her allopurinol and colchicine which she's not taking.  Also Dr. Christen Cunningham gave her potassium supplements which she didn't take. She did take 1 tablet starting yesterday.   Review of Systems    review of systems otherwise negative Objective:   Physical Exam Well-developed well-nourished female no acute distress weight stable to 188 pounds BP 110/62 standard no soreness left wrist no erythema       Assessment & Plan:  Acute gout//////........ Check labs........restart prednisone,,,,,,,,, hold the allopurinol and colchicine  Hypertension. Congestive heart failure.........Marland Kitchen Decrease Catapres to 1 daily stop the Lasix check potassium level

## 2013-12-22 ENCOUNTER — Other Ambulatory Visit: Payer: Self-pay | Admitting: *Deleted

## 2013-12-22 MED ORDER — ALLOPURINOL 100 MG PO TABS: 100.0000 mg | ORAL_TABLET | Freq: Every day | ORAL | Status: AC

## 2013-12-26 ENCOUNTER — Encounter: Payer: Self-pay | Admitting: Family Medicine

## 2013-12-26 ENCOUNTER — Ambulatory Visit (INDEPENDENT_AMBULATORY_CARE_PROVIDER_SITE_OTHER): Payer: Medicare Other | Admitting: Family Medicine

## 2013-12-26 VITALS — BP 160/90 | Temp 97.4°F | Wt 195.0 lb

## 2013-12-26 DIAGNOSIS — I5032 Chronic diastolic (congestive) heart failure: Secondary | ICD-10-CM | POA: Diagnosis not present

## 2013-12-26 DIAGNOSIS — E876 Hypokalemia: Secondary | ICD-10-CM | POA: Diagnosis not present

## 2013-12-26 LAB — BASIC METABOLIC PANEL
BUN: 38 mg/dL — ABNORMAL HIGH (ref 6–23)
CHLORIDE: 112 meq/L (ref 96–112)
CO2: 22 mEq/L (ref 19–32)
CREATININE: 1.5 mg/dL — AB (ref 0.4–1.2)
Calcium: 9 mg/dL (ref 8.4–10.5)
GFR: 35.57 mL/min — AB (ref >60.00)
Glucose, Bld: 165 mg/dL — ABNORMAL HIGH (ref 70–99)
Potassium: 5 mEq/L (ref 3.5–5.1)
Sodium: 146 mEq/L — ABNORMAL HIGH (ref 135–145)

## 2013-12-26 NOTE — Progress Notes (Signed)
Pre visit review using our clinic review tool, if applicable. No additional management support is needed unless otherwise documented below in the visit note. 

## 2013-12-26 NOTE — Patient Instructions (Signed)
Labs today  We will call you the report and discuss any medication changes by phone

## 2013-12-26 NOTE — Progress Notes (Signed)
   Subjective:    Patient ID: Amanda Cunningham, female    DOB: [birth year], 80 y.o.   MRN: [id number]  HPI Amanda Cunningham is a 80 year old female who comes in today for follow-up of chronic congestive heart failure and renal insufficiency  She has chronic kidney disease stage III and last saw Dr. Fransico Michael on October 20. At that time increased her Lasix from 20 mg a day to 40. She promptly diuresed 9 pounds in 8 days however she felt terrible and her potassium dropped to 2.9. Weight started on a potassium supplement last Thursday 20 mEq twice daily and held the Lasix. Today she feels much better her weight is up to 195 pounds so she's gained 7 pounds in 6 days. She states she is not short of breath but her legs are very swollen.     Review of Systems    review of systems otherwise negative Objective:   Physical Exam Well-developed well-nourished female no acute distress vital signs stable BP 195 BP 110/60  In general she is a well-developed well-nourished female no acute distress cardiopulmonary exam normal no crackles today. She has 2-3+ peripheral edema       Assessment & Plan:  Chronic diastolic congestive heart failure..........Marland Kitchen Rapid diuresis with 40 mg of Lasix however she developed hypokalemia. We increased her potassium to 20 mEq twice day held the Lasix and will get follow-up labs today.

## 2013-12-28 ENCOUNTER — Other Ambulatory Visit: Payer: Self-pay | Admitting: *Deleted

## 2013-12-28 MED ORDER — FUROSEMIDE 20 MG PO TABS: 20.0000 mg | ORAL_TABLET | Freq: Every day | ORAL | Status: DC

## 2013-12-29 ENCOUNTER — Ambulatory Visit: Payer: Medicare Other | Admitting: Family Medicine

## 2014-01-02 ENCOUNTER — Encounter: Payer: Self-pay | Admitting: Family Medicine

## 2014-01-02 ENCOUNTER — Ambulatory Visit (INDEPENDENT_AMBULATORY_CARE_PROVIDER_SITE_OTHER): Payer: Medicare Other | Admitting: Family Medicine

## 2014-01-02 VITALS — BP 120/70 | Temp 97.3°F | Wt 190.0 lb

## 2014-01-02 DIAGNOSIS — I5032 Chronic diastolic (congestive) heart failure: Secondary | ICD-10-CM | POA: Diagnosis not present

## 2014-01-02 DIAGNOSIS — I10 Essential (primary) hypertension: Secondary | ICD-10-CM

## 2014-01-02 LAB — BASIC METABOLIC PANEL
BUN: 35 mg/dL — ABNORMAL HIGH (ref 6–23)
CALCIUM: 8.6 mg/dL (ref 8.4–10.5)
CO2: 24 mEq/L (ref 19–32)
Chloride: 108 mEq/L (ref 96–112)
Creatinine, Ser: 1.6 mg/dL — ABNORMAL HIGH (ref 0.4–1.2)
GFR: 31.33 mL/min — ABNORMAL LOW (ref >60.00)
GLUCOSE: 96 mg/dL (ref 70–99)
POTASSIUM: 3.8 meq/L (ref 3.5–5.1)
SODIUM: 143 meq/L (ref 135–145)

## 2014-01-02 NOTE — Progress Notes (Signed)
Pre visit review using our clinic review tool, if applicable. No additional management support is needed unless otherwise documented below in the visit note. 

## 2014-01-02 NOTE — Patient Instructions (Signed)
Continue your current medication  Labs today  I will call you the report and we will discuss any medication changes and follow-up by phone

## 2014-01-02 NOTE — Progress Notes (Signed)
   Subjective:    Patient ID: Amanda Cunningham, female    DOB: 04/30/1919, 80 y.o.   MRN: [id number]  HPI Amanda Cunningham is a 80 year old married female nonsmoker who comes in today accompanied by her husband for follow-up of congestive heart failure renal insufficiency and hypertension  We restarted her Lasix at 20 mg daily week ago her weight has dropped 78 pounds. She's now 190. She says she has some swelling her legs for a total knee replacement on the right but the edema is much less than it was a week ago. Amanda Cunningham given a one potassium tablet a day. She was on 2 a day because her potassium level has dropped on 40 mg of Lasix daily  She says overall she feels okay.  BP is dropped down to 120/80  She says she sleeps well does not wake up at night short of breath   Review of Systems Review of systems otherwise negative    Objective:   Physical Exam  Well-developed well-nourished female no acute distress vital signs stable she is afebrile BP today 120/80  Weight down to 190......Marland Kitchen 195 a week ago  Cardiopulmonary exam normal no crackles 1+ edema lower extremities bilaterally      Assessment & Plan:  Chronic diastolic heart failure, hypertension, renal insufficiency......... stable on current regime........ check labs

## 2014-01-10 DIAGNOSIS — M109 Gout, unspecified: Secondary | ICD-10-CM | POA: Diagnosis not present

## 2014-01-10 DIAGNOSIS — N183 Chronic kidney disease, stage 3 (moderate): Secondary | ICD-10-CM | POA: Diagnosis not present

## 2014-01-17 ENCOUNTER — Ambulatory Visit (INDEPENDENT_AMBULATORY_CARE_PROVIDER_SITE_OTHER): Payer: Medicare Other | Admitting: Family Medicine

## 2014-01-17 ENCOUNTER — Encounter: Payer: Self-pay | Admitting: Family Medicine

## 2014-01-17 VITALS — BP 130/80 | Temp 97.6°F | Wt 190.0 lb

## 2014-01-17 DIAGNOSIS — M10432 Other secondary gout, left wrist: Secondary | ICD-10-CM

## 2014-01-17 DIAGNOSIS — I5032 Chronic diastolic (congestive) heart failure: Secondary | ICD-10-CM

## 2014-01-17 DIAGNOSIS — N183 Chronic kidney disease, stage 3 unspecified: Secondary | ICD-10-CM

## 2014-01-17 DIAGNOSIS — N189 Chronic kidney disease, unspecified: Secondary | ICD-10-CM | POA: Diagnosis not present

## 2014-01-17 LAB — URIC ACID: Uric Acid, Serum: 6.3 mg/dL (ref 2.4–7.0)

## 2014-01-17 NOTE — Progress Notes (Signed)
   Subjective:    Patient ID: Amanda Cunningham, female    DOB: 1919-03-12, 80 y.o.   MRN: [id number]  HPI Amanda Cunningham is a delightful 80 year old married female nonsmoker who moved here to be with her daughter Amanda Cunningham from [place]. She comes in today for follow-up of her diastolic dysfunction and renal insufficiency  She states overall she feels well her weight which she does at home daily is 190 pounds here with her close......Marland Kitchen 184 at home. Weight has been steady. She knows if she gains 2 pounds to take an extra diuretic.  Activities a minimal because of her diastolic dysfunction and deconditioning. She does not walk for a much very sedentary.  When she was in [place] uric acid was elevated and she was placed on allopurinol 100 mg daily and prednisone which she took for 3 weeks. The pain is diminished but not completely gone. We will check a uric acid level today and also get around consult with Amanda Cunningham to see if an injection in that wrist might be helpful. I'm disinclined to put on another round of oral steroids because of her cardiovascular status   Review of Systems    review of systems otherwise negative Objective:   Physical Exam  Well-developed well-nourished female no acute distress vital signs stable she is afebrile weight is 190 BP 130/80  Pulse is 70 and regular lungs are clear no crackles      Assessment & Plan:  Diastolic dysfunction renal insufficiency...Marland KitchenMarland KitchenMarland Kitchen Stable....... continue current medications follow-up in one month

## 2014-01-17 NOTE — Progress Notes (Signed)
Pre visit review using our clinic review tool, if applicable. No additional management support is needed unless otherwise documented below in the visit note. 

## 2014-01-17 NOTE — Patient Instructions (Addendum)
Continue your current medications  We will check a uric acid level today  Call the office of Dr. Joni Fears [phone] to be seen because of the persistent soreness in your left wrist  Follow-up in one month sooner if any problem  Weight yourself every day,,,,,,,,,,, if your weight goes up 2 pounds take an extra dose of Lasix

## 2014-01-26 ENCOUNTER — Other Ambulatory Visit: Payer: Medicare Other

## 2014-01-26 ENCOUNTER — Telehealth: Payer: Self-pay | Admitting: Hematology and Oncology

## 2014-01-26 NOTE — Telephone Encounter (Signed)
, °

## 2014-01-27 DIAGNOSIS — M10332 Gout due to renal impairment, left wrist: Secondary | ICD-10-CM | POA: Diagnosis not present

## 2014-01-30 ENCOUNTER — Ambulatory Visit: Payer: Medicare Other | Admitting: Hematology and Oncology

## 2014-02-01 ENCOUNTER — Ambulatory Visit
Admission: RE | Admit: 2014-02-01 | Discharge: 2014-02-01 | Disposition: A | Discharge status: Home / Self Care | Payer: Medicare Other | Source: Ambulatory Visit | Attending: Hematology and Oncology | Admitting: Hematology and Oncology

## 2014-02-01 DIAGNOSIS — N63 Unspecified lump in breast: Secondary | ICD-10-CM | POA: Diagnosis not present

## 2014-02-01 DIAGNOSIS — C50912 Malignant neoplasm of unspecified site of left female breast: Secondary | ICD-10-CM

## 2014-02-07 ENCOUNTER — Telehealth: Payer: Self-pay | Admitting: Hematology and Oncology

## 2014-02-07 ENCOUNTER — Ambulatory Visit (HOSPITAL_BASED_OUTPATIENT_CLINIC_OR_DEPARTMENT_OTHER): Payer: Medicare Other | Admitting: Hematology and Oncology

## 2014-02-07 DIAGNOSIS — Z170 Estrogen receptor positive status [ER+]: Secondary | ICD-10-CM

## 2014-02-07 DIAGNOSIS — C50512 Malignant neoplasm of lower-outer quadrant of left female breast: Secondary | ICD-10-CM

## 2014-02-07 MED ORDER — TAMOXIFEN CITRATE 10 MG PO TABS: 10.0000 mg | ORAL_TABLET | Freq: Every day | ORAL | Status: DC

## 2014-02-07 NOTE — Assessment & Plan Note (Signed)
Relapsed left breast invasive lobular cancer 0.6 cm ER 100% PR 0% HER-2/neu negative Ki-67 13% T1 B. N0 M0 stage IA, this is the same kind of breast cancer that she had 2008. At treated at [facility] clinic with lumpectomy followed by Arimidex which was stopped early because of side effects  Three-month follow-up ultrasound revealed that the breast tumor is slightly increased in size. After lengthy discussion we elected to initiate tamoxifen 10 mg daily to assess tolerability to treatment.  Return to clinic in 3 months for follow-up

## 2014-02-07 NOTE — Progress Notes (Signed)
Patient Care Team: Dorena Cookey, MD as PCP - General (Family Medicine)  DIAGNOSIS: Relapsed breast cancer invasive lobular cancer grade 2 that was ER positive PR negative HER-2  Negative Current treatment: Tamoxifen 10 mg daily starting 02/07/2014  CHIEF COMPLIANT: Follow-up after ultrasound  INTERVAL HISTORY: Amanda Cunningham is a 80 year old lady with relapsed breast cancer who was watched for 3 months and underwent a repeat ultrasound and is here today to discuss the results. Her only complaint is some itching sensation beneath the left breast.   REVIEW OF SYSTEMS:   Constitutional: Denies fevers, chills or abnormal weight loss Eyes: Denies blurriness of vision Ears, nose, mouth, throat, and face: Denies mucositis or sore throat Respiratory: Denies cough, dyspnea or wheezes Cardiovascular: Denies palpitation, chest discomfort or lower extremity swelling Gastrointestinal:  Denies nausea, heartburn or change in bowel habits Skin: Denies abnormal skin rashes Lymphatics: Denies new lymphadenopathy or easy bruising Neurological:Denies numbness, tingling or new weaknesses Behavioral/Psych: Mood is stable, no new changes  Breast:  denies any pain or lumps or nodules in either breasts All other systems were reviewed with the patient and are negative.  I have reviewed the past medical history, past surgical history, social history and family history with the patient and they are unchanged from previous note.  ALLERGIES:  is allergic to iodine; ciprofloxacin; codeine; sodium sulfite; and tape.  MEDICATIONS:  Current Outpatient Prescriptions  Medication Sig Dispense Refill  . allopurinol (ZYLOPRIM) 100 MG tablet Take 1 tablet (100 mg total) by mouth daily. 90 tablet 3  . amLODipine (NORVASC) 10 MG tablet Take 1 tablet (10 mg total) by mouth 2 (two) times daily. 31 tablet 3  . aspirin 81 MG tablet Take 1 tablet (81 mg total) by mouth daily. 30 tablet 1  . calcitRIOL (ROCALTROL) 0.25 MCG  capsule     . cloNIDine (CATAPRES) 0.2 MG tablet Take 1 tablet (0.2 mg total) by mouth 2 (two) times daily. (Patient taking differently: Take 0.2 mg by mouth. Half tab daily) 62 tablet 3  . clopidogrel (PLAVIX) 75 MG tablet TAKE 1 TABLET BY MOUTH DAILY 90 tablet 3  . colchicine 0.6 MG tablet Take 0.6 mg by mouth daily.    . dorzolamide-timolol (COSOPT) 22.3-6.8 MG/ML ophthalmic solution Place 1 drop into both eyes at bedtime.    . ferrous sulfate 325 (65 FE) MG tablet Take 325 mg by mouth daily with breakfast.    . furosemide (LASIX) 20 MG tablet Take 1 tablet (20 mg total) by mouth daily. 30 tablet 3  . glimepiride (AMARYL) 2 MG tablet Take 1 mg by mouth daily with breakfast.     . glucose blood (ONE TOUCH ULTRA TEST) test strip Test once daily    . hydrALAZINE (APRESOLINE) 100 MG tablet Take 1 tablet (100 mg total) by mouth 2 (two) times daily. 90 tablet 3  . hydrALAZINE (APRESOLINE) 50 MG tablet TAKE 1 TABLET BY MOUTH TWICE A DAY 60 tablet 4  . KLOR-CON M20 20 MEQ tablet Take 20 mEq by mouth once.     . labetalol (NORMODYNE) 200 MG tablet Take 1 tablet (200 mg total) by mouth 2 (two) times daily. 200 tablet 3  . levothyroxine (SYNTHROID, LEVOTHROID) 150 MCG tablet Take 150 mcg by mouth daily before breakfast.     . Melatonin 1 MG TABS Take 1 mg by mouth at bedtime as needed (sleep).     . Multiple Vitamins-Minerals (PRESERVISION/LUTEIN) CAPS Take 1 capsule by mouth 2 (two) times daily.     Marland Kitchen  pantoprazole (PROTONIX) 40 MG tablet Take 40 mg by mouth daily.      . predniSONE (DELTASONE) 20 MG tablet 2 tabs x 3 days, 1 tab x 3 days, 1/2 tab x 3 days, 1/2 tab M,W,F x 2 weeks 40 tablet 1  . travoprost, benzalkonium, (TRAVATAN) 0.004 % ophthalmic solution Place 1 drop into both eyes at bedtime.     . tamoxifen (NOLVADEX) 10 MG tablet Take 1 tablet (10 mg total) by mouth daily. 90 tablet 3   No current facility-administered medications for this visit.    PHYSICAL EXAMINATION: ECOG PERFORMANCE  STATUS: 0 - Asymptomatic  Filed Vitals:   02/07/14 1502  BP: 180/61  Pulse: 66  Temp: 97.4 F (36.3 C)  Resp: 18   Filed Weights   02/07/14 1502  Weight: 192 lb 3.2 oz (87.181 kg)    GENERAL:alert, no distress and comfortable SKIN: skin color, texture, turgor are normal, no rashes or significant lesions EYES: normal, Conjunctiva are pink and non-injected, sclera clear OROPHARYNX:no exudate, no erythema and lips, buccal mucosa, and tongue normal  NECK: supple, thyroid normal size, non-tender, without nodularity LYMPH:  no palpable lymphadenopathy in the cervical, axillary or inguinal LUNGS: clear to auscultation and percussion with normal breathing effort HEART: regular rate & rhythm and no murmurs and no lower extremity edema ABDOMEN:abdomen soft, non-tender and normal bowel sounds Musculoskeletal:no cyanosis of digits and no clubbing  NEURO: alert & oriented x 3 with fluent speech, no focal motor/sensory deficits   LABORATORY DATA:  I have reviewed the data as listed   Chemistry      Component Value Date/Time   NA 143 01/02/2014 1226   K 3.8 01/02/2014 1226   CL 108 01/02/2014 1226   CO2 24 01/02/2014 1226   BUN 35* 01/02/2014 1226   CREATININE 1.6* 01/02/2014 1226   CREATININE 1.49* 12/21/2012 1139      Component Value Date/Time   CALCIUM 8.6 01/02/2014 1226   ALKPHOS 128* 06/15/2013 0513   AST 20 06/15/2013 0513   ALT 24 06/15/2013 0513   BILITOT 0.3 06/15/2013 0513       Lab Results  Component Value Date   WBC 15.8* 12/21/2013   HGB 11.2* 12/21/2013   HCT 34.8* 12/21/2013   MCV 79.5 12/21/2013   PLT 328.0 12/21/2013   NEUTROABS 13.9* 12/21/2013    ASSESSMENT & PLAN:  Breast cancer of lower-outer quadrant of left female breast Relapsed left breast invasive lobular cancer 0.6 cm ER 100% PR 0% HER-2/neu negative Ki-67 13% T1 B. N0 M0 stage IA, this is the same kind of breast cancer that she had 2008. At treated at [facility] clinic with lumpectomy  followed by Arimidex which was stopped early because of side effects  Three-month follow-up ultrasound revealed that the breast tumor is slightly increased in size. After lengthy discussion we elected to initiate tamoxifen 10 mg daily to assess tolerability to treatment.  Return to clinic in 3 months for follow-up   No orders of the defined types were placed in this encounter.   The patient has a good understanding of the overall plan. she agrees with it. She will call with any problems that may develop before her next visit here.   Rulon Eisenmenger, MD 02/07/2014 4:14 PM

## 2014-02-07 NOTE — Telephone Encounter (Signed)
, °

## 2014-02-09 DIAGNOSIS — E1151 Type 2 diabetes mellitus with diabetic peripheral angiopathy without gangrene: Secondary | ICD-10-CM | POA: Diagnosis not present

## 2014-02-09 DIAGNOSIS — I739 Peripheral vascular disease, unspecified: Secondary | ICD-10-CM | POA: Diagnosis not present

## 2014-02-09 DIAGNOSIS — L603 Nail dystrophy: Secondary | ICD-10-CM | POA: Diagnosis not present

## 2014-02-14 DIAGNOSIS — H3532 Exudative age-related macular degeneration: Secondary | ICD-10-CM | POA: Diagnosis not present

## 2014-02-14 DIAGNOSIS — H4011X0 Primary open-angle glaucoma, stage unspecified: Secondary | ICD-10-CM | POA: Diagnosis not present

## 2014-02-14 DIAGNOSIS — H1859 Other hereditary corneal dystrophies: Secondary | ICD-10-CM | POA: Diagnosis not present

## 2014-02-17 DIAGNOSIS — M19042 Primary osteoarthritis, left hand: Secondary | ICD-10-CM | POA: Diagnosis not present

## 2014-02-20 ENCOUNTER — Ambulatory Visit (INDEPENDENT_AMBULATORY_CARE_PROVIDER_SITE_OTHER): Payer: Medicare Other | Admitting: Family Medicine

## 2014-02-20 ENCOUNTER — Encounter: Payer: Self-pay | Admitting: Family Medicine

## 2014-02-20 VITALS — BP 108/70 | Temp 97.5°F | Wt 188.0 lb

## 2014-02-20 DIAGNOSIS — N183 Chronic kidney disease, stage 3 unspecified: Secondary | ICD-10-CM

## 2014-02-20 DIAGNOSIS — N189 Chronic kidney disease, unspecified: Secondary | ICD-10-CM | POA: Diagnosis not present

## 2014-02-20 DIAGNOSIS — I5032 Chronic diastolic (congestive) heart failure: Secondary | ICD-10-CM | POA: Diagnosis not present

## 2014-02-20 NOTE — Patient Instructions (Signed)
Continue your current medications  Follow-up i  The second week in April

## 2014-02-20 NOTE — Progress Notes (Signed)
   Subjective:    Patient ID: Amanda Cunningham, female    DOB: 03-04-1919, 81 y.o.   MRN: [id number]  HPI  Amanda Cunningham is a 81 year old married female nonsmoker who comes in today become P by her husband for evaluation of congestive heart failure  We have been following her over the past year since she moved here from [place] to be near her daughter Amanda Cunningham.   She's doing well has no complaints. Her weight is steady at 188 pounds. BP is 108/70. She denies any shortness of breath chest pain etc. Etc.  Renal function done in November was stable   Review of Systems     Review of systems otherwise negative Objective:   Physical Exam   well-developed well-nourished female no acute distress vital signs stable she is a bit febrile BP 108/70 pulse 70 and regular chest was clear to auscultation no crackles pulse was 80 and regular      Assessment & Plan:   diastolic congestive heart failure with renal insufficiency........... Continue current meds follow-up in 3 months

## 2014-02-20 NOTE — Progress Notes (Signed)
Pre visit review using our clinic review tool, if applicable. No additional management support is needed unless otherwise documented below in the visit note. 

## 2014-03-07 ENCOUNTER — Telehealth: Payer: Self-pay | Admitting: Family Medicine

## 2014-03-07 NOTE — Telephone Encounter (Signed)
Pt got a rx filled by a dr at the [facility]. It was for KLOR-CON M20 20 MEQ tablet (20 mg 2 x day).  Pt thinks dr todd prescribed one time /day at 10 mg. Pt not sure she has been taking the right amount. Pt got the rx filled 12/21 Would like a cb. Pt not feeling any different. pls advise.

## 2014-03-07 NOTE — Telephone Encounter (Signed)
Per medication list/ lab result, patient should be taking 1 klor-con M20 daily.  Patient is aware.

## 2014-03-14 DIAGNOSIS — H31011 Macula scars of posterior pole (postinflammatory) (post-traumatic), right eye: Secondary | ICD-10-CM | POA: Diagnosis not present

## 2014-03-14 DIAGNOSIS — Z961 Presence of intraocular lens: Secondary | ICD-10-CM | POA: Diagnosis not present

## 2014-03-14 DIAGNOSIS — E11329 Type 2 diabetes mellitus with mild nonproliferative diabetic retinopathy without macular edema: Secondary | ICD-10-CM | POA: Diagnosis not present

## 2014-03-14 DIAGNOSIS — H1859 Other hereditary corneal dystrophies: Secondary | ICD-10-CM | POA: Diagnosis not present

## 2014-03-14 DIAGNOSIS — H3532 Exudative age-related macular degeneration: Secondary | ICD-10-CM | POA: Diagnosis not present

## 2014-03-14 DIAGNOSIS — H3531 Nonexudative age-related macular degeneration: Secondary | ICD-10-CM | POA: Diagnosis not present

## 2014-03-14 DIAGNOSIS — H4011X0 Primary open-angle glaucoma, stage unspecified: Secondary | ICD-10-CM | POA: Diagnosis not present

## 2014-03-21 ENCOUNTER — Other Ambulatory Visit: Payer: Self-pay | Admitting: Family Medicine

## 2014-04-03 DIAGNOSIS — M109 Gout, unspecified: Secondary | ICD-10-CM | POA: Diagnosis not present

## 2014-04-03 DIAGNOSIS — C50911 Malignant neoplasm of unspecified site of right female breast: Secondary | ICD-10-CM | POA: Diagnosis not present

## 2014-04-03 DIAGNOSIS — N183 Chronic kidney disease, stage 3 (moderate): Secondary | ICD-10-CM | POA: Diagnosis not present

## 2014-04-03 DIAGNOSIS — C50912 Malignant neoplasm of unspecified site of left female breast: Secondary | ICD-10-CM | POA: Diagnosis not present

## 2014-04-06 ENCOUNTER — Telehealth: Payer: Self-pay | Admitting: Family Medicine

## 2014-04-06 ENCOUNTER — Encounter: Payer: Self-pay | Admitting: Family Medicine

## 2014-04-06 ENCOUNTER — Ambulatory Visit (INDEPENDENT_AMBULATORY_CARE_PROVIDER_SITE_OTHER): Payer: Medicare Other | Admitting: Family Medicine

## 2014-04-06 VITALS — BP 170/60 | HR 72 | Temp 98.0°F | Wt 183.0 lb

## 2014-04-06 DIAGNOSIS — C50512 Malignant neoplasm of lower-outer quadrant of left female breast: Secondary | ICD-10-CM | POA: Diagnosis not present

## 2014-04-06 DIAGNOSIS — I5032 Chronic diastolic (congestive) heart failure: Secondary | ICD-10-CM

## 2014-04-06 NOTE — Progress Notes (Signed)
Pre visit review using our clinic review tool, if applicable. No additional management support is needed unless otherwise documented below in the visit note. 

## 2014-04-06 NOTE — Progress Notes (Signed)
   Subjective:    Patient ID: [name], female    DOB: [birth year], 81 y.o.   MRN: [id number]  HPI Shalee is a 81 year old female who is brought into the office at the end of the day by her daughter [name] because of fatigue no energy weight loss  She start tamoxifen about 3 weeks ago because of breast cancer. She's actually lost weight since she was here last. She's lost about 4-5 pounds. She has no energy. Recently went to nephrology on Monday of this week. Lab work was drawn however we called in their offices now closed. She also stopped the allopurinol for whatever reason  Review of Systems  Review of systems otherwise negative     Objective:   Physical Exam Well-developed well-nourished female no acute distress pulse ox on room air 94% we walked around the building and it stayed at 94% BP 170/60       Assessment & Plan:  Fatigue weight loss probably secondary to side effects from tamoxifen........ check labs

## 2014-04-06 NOTE — Telephone Encounter (Signed)
PLEASE NOTE: All timestamps contained within this report are represented as [place] Standard Time. CONFIDENTIALTY NOTICE: This fax transmission is intended only for the addressee. It contains information that is legally privileged, confidential or otherwise protected from use or disclosure. If you are not the intended recipient, you are strictly prohibited from reviewing, disclosing, copying using or disseminating any of this information or taking any action in reliance on or regarding this information. If you have received this fax in error, please notify us immediately by telephone so that we can arrange for its return to [place]. Phone: [phone], Toll-Free: [phone], Fax: [phone] Page: 1 of 1 Call Id: [id number] [facility] [place] Patient Name: Amanda Cunningham DOB: 1919-07-13 Initial Comment Caller states her mom had called earlier this week to see the DR. She is having fatigue and weakness. Nurse Assessment Nurse: Arnoldo Morale, RN, Shelton Silvas Date/Time (Eastern Time): 04/06/2014 3:39:48 PM Confirm and document reason for call. If symptomatic, describe symptoms. ---Caller states her mom had called earlier this week to see the DR. She is having fatigue and weakness. The dtr called the office back and set an appt for tuesday at noon. Pt is struggling to even walk. Pt has stopped taking her medication for uric acid thinking that it might be that. Has the patient traveled out of the country within the last 30 days? ---Not Applicable Does the patient require triage? ---Yes Related visit to physician within the last 2 weeks? ---No Does the PT have any chronic conditions? (i.e. diabetes, asthma, etc.) ---Yes List chronic conditions. ---CKD, breast CA, HTN, CHF, Diabetes Guidelines Guideline Title Affirmed Question Affirmed Notes Weakness (Generalized) and Fatigue [1] MODERATE weakness (i.e., interferes with work,  school, normal activities) AND [2] cause unknown (Exceptions: weakness with acute minor illness, or weakness from poor fluid intake) Final Disposition User See Physician within 4 Hours (or PCP triage) Arnoldo Morale, RN, Shelton Silvas Comments DTR refused ER or urgent care and stated she only wanted the pt to see Dr. Sherren Mocha. Outcome was 24 hours so I called the office to see about working the ot in. The receptionist took a message and stated that she will check with the nurse and call the pt back. Advised the pt.

## 2014-04-06 NOTE — Telephone Encounter (Signed)
Patient here in office for evaluation

## 2014-04-06 NOTE — Patient Instructions (Signed)
Stop the tamoxifen  Allopurinol........Marland Kitchen 1 daily  Labs now  We will call you tomorrow

## 2014-04-07 LAB — BASIC METABOLIC PANEL
BUN: 38 mg/dL — ABNORMAL HIGH (ref 6–23)
CHLORIDE: 107 meq/L (ref 96–112)
CO2: 24 meq/L (ref 19–32)
Calcium: 8.4 mg/dL (ref 8.4–10.5)
Creatinine, Ser: 1.86 mg/dL — ABNORMAL HIGH (ref 0.40–1.20)
GFR: 26.89 mL/min — ABNORMAL LOW (ref >60.00)
GLUCOSE: 143 mg/dL — AB (ref 70–99)
POTASSIUM: 3.8 meq/L (ref 3.5–5.1)
Sodium: 142 mEq/L (ref 135–145)

## 2014-04-07 LAB — CBC WITH DIFFERENTIAL/PLATELET
Basophils Absolute: 0 10*3/uL (ref 0.0–0.1)
Basophils Relative: 0.1 % (ref 0.0–3.0)
EOS PCT: 4 % (ref 0.0–5.0)
Eosinophils Absolute: 0.3 10*3/uL (ref 0.0–0.7)
HCT: 32.9 % — ABNORMAL LOW (ref 36.0–46.0)
Hemoglobin: 10.8 g/dL — ABNORMAL LOW (ref 12.0–15.0)
LYMPHS PCT: 11.5 % — AB (ref 12.0–46.0)
Lymphs Abs: 1 10*3/uL (ref 0.7–4.0)
MCHC: 32.7 g/dL (ref 30.0–36.0)
MCV: 79.7 fl (ref 78.0–100.0)
Monocytes Absolute: 0.7 10*3/uL (ref 0.1–1.0)
Monocytes Relative: 7.9 % (ref 3.0–12.0)
NEUTROS ABS: 6.5 10*3/uL (ref 1.4–7.7)
NEUTROS PCT: 76.5 % (ref 43.0–77.0)
Platelets: 318 10*3/uL (ref 150.0–400.0)
RBC: 4.13 Mil/uL (ref 3.87–5.11)
RDW: 17.5 % — ABNORMAL HIGH (ref 11.5–15.5)
WBC: 8.5 10*3/uL (ref 4.0–10.5)

## 2014-04-10 ENCOUNTER — Other Ambulatory Visit: Payer: Self-pay | Admitting: Family Medicine

## 2014-04-11 ENCOUNTER — Ambulatory Visit: Payer: Medicare Other | Admitting: Family Medicine

## 2014-04-20 DIAGNOSIS — L603 Nail dystrophy: Secondary | ICD-10-CM | POA: Diagnosis not present

## 2014-04-20 DIAGNOSIS — L84 Corns and callosities: Secondary | ICD-10-CM | POA: Diagnosis not present

## 2014-04-20 DIAGNOSIS — E1151 Type 2 diabetes mellitus with diabetic peripheral angiopathy without gangrene: Secondary | ICD-10-CM | POA: Diagnosis not present

## 2014-04-20 DIAGNOSIS — I739 Peripheral vascular disease, unspecified: Secondary | ICD-10-CM | POA: Diagnosis not present

## 2014-04-27 ENCOUNTER — Encounter: Payer: Self-pay | Admitting: Family Medicine

## 2014-04-27 ENCOUNTER — Ambulatory Visit (INDEPENDENT_AMBULATORY_CARE_PROVIDER_SITE_OTHER): Payer: Medicare Other | Admitting: Family Medicine

## 2014-04-27 ENCOUNTER — Other Ambulatory Visit: Payer: Self-pay | Admitting: Family Medicine

## 2014-04-27 VITALS — BP 130/70 | HR 80 | Temp 97.5°F | Wt 184.0 lb

## 2014-04-27 DIAGNOSIS — I5032 Chronic diastolic (congestive) heart failure: Secondary | ICD-10-CM | POA: Diagnosis not present

## 2014-04-27 NOTE — Progress Notes (Signed)
Pre visit review using our clinic review tool, if applicable. No additional management support is needed unless otherwise documented below in the visit note. 

## 2014-04-27 NOTE — Progress Notes (Signed)
   Subjective:    Patient ID: Amanda Cunningham, female    DOB: 17-Mar-1919, 81 y.o.   MRN: [id number]  HPI Amanda Cunningham is a 81 year old female who comes in today By her husband for reevaluation diastolic congestive heart failure  Her weight last time was 183 it's 184 today. Her pulse ox on room air is 93% pulse is 80 and regular  She says she feels well except she's fatigued. Although she has absolutely no exercise. She can't exercise she says because of her sore knees.  We stopped the tamoxifen because of the side effects and she feels much better  Blood pressure on her medication is stable at 130/70. She also complains of sleep dysfunction for many years. She goes to bed 11 tries to sleep around midnight then wakes up every 2 hours during the evening because she has to urinate. Then she has trouble going back to sleep.   Review of Systems Review of systems otherwise negative    Objective:   Physical Exam Well-developed well-nourished elderly female no acute distress vital signs stable she's afebrile cardiac exam is normal except for distant heart sounds no murmur. Chest is clear no crackles       Assessment & Plan:  Diastolic congestive heart failure clinically stable..... Continue current meds follow-up in 2 months.  Sleep dysfunction............ trial of melatonin  Deconditioned fatigue no exercise.....Marland Kitchen outlined exercise program with a walker

## 2014-04-27 NOTE — Patient Instructions (Signed)
Try melatonin to help you sleep,,,,,,,,, 1 at bedtime  4. Walker,,,,,,,, get outside and walk 5 minutes twice a day  Continue current medications  Follow-up in May

## 2014-05-02 ENCOUNTER — Inpatient Hospital Stay (HOSPITAL_COMMUNITY)
Admission: EM | Admit: 2014-05-02 | Discharge: 2014-05-05 | DRG: 389 | Disposition: A | Discharge status: Home / Self Care | Payer: Medicare Other | Attending: Internal Medicine | Admitting: Internal Medicine

## 2014-05-02 ENCOUNTER — Emergency Department (HOSPITAL_COMMUNITY): Payer: Medicare Other

## 2014-05-02 ENCOUNTER — Other Ambulatory Visit (HOSPITAL_COMMUNITY): Payer: Self-pay

## 2014-05-02 ENCOUNTER — Encounter (HOSPITAL_COMMUNITY): Payer: Self-pay | Admitting: Emergency Medicine

## 2014-05-02 DIAGNOSIS — Z170 Estrogen receptor positive status [ER+]: Secondary | ICD-10-CM

## 2014-05-02 DIAGNOSIS — N184 Chronic kidney disease, stage 4 (severe): Secondary | ICD-10-CM | POA: Diagnosis present

## 2014-05-02 DIAGNOSIS — K56609 Unspecified intestinal obstruction, unspecified as to partial versus complete obstruction: Secondary | ICD-10-CM | POA: Diagnosis present

## 2014-05-02 DIAGNOSIS — Z7982 Long term (current) use of aspirin: Secondary | ICD-10-CM

## 2014-05-02 DIAGNOSIS — I5032 Chronic diastolic (congestive) heart failure: Secondary | ICD-10-CM | POA: Diagnosis present

## 2014-05-02 DIAGNOSIS — E039 Hypothyroidism, unspecified: Secondary | ICD-10-CM | POA: Diagnosis present

## 2014-05-02 DIAGNOSIS — E860 Dehydration: Secondary | ICD-10-CM | POA: Diagnosis present

## 2014-05-02 DIAGNOSIS — N183 Chronic kidney disease, stage 3 unspecified: Secondary | ICD-10-CM | POA: Diagnosis present

## 2014-05-02 DIAGNOSIS — E876 Hypokalemia: Secondary | ICD-10-CM | POA: Diagnosis present

## 2014-05-02 DIAGNOSIS — E119 Type 2 diabetes mellitus without complications: Secondary | ICD-10-CM | POA: Diagnosis not present

## 2014-05-02 DIAGNOSIS — Z9049 Acquired absence of other specified parts of digestive tract: Secondary | ICD-10-CM | POA: Diagnosis present

## 2014-05-02 DIAGNOSIS — Z96642 Presence of left artificial hip joint: Secondary | ICD-10-CM | POA: Diagnosis present

## 2014-05-02 DIAGNOSIS — C50919 Malignant neoplasm of unspecified site of unspecified female breast: Secondary | ICD-10-CM | POA: Diagnosis present

## 2014-05-02 DIAGNOSIS — K5660 Unspecified intestinal obstruction: Principal | ICD-10-CM | POA: Diagnosis present

## 2014-05-02 DIAGNOSIS — I739 Peripheral vascular disease, unspecified: Secondary | ICD-10-CM | POA: Diagnosis not present

## 2014-05-02 DIAGNOSIS — K5790 Diverticulosis of intestine, part unspecified, without perforation or abscess without bleeding: Secondary | ICD-10-CM | POA: Diagnosis present

## 2014-05-02 DIAGNOSIS — K5669 Other intestinal obstruction: Secondary | ICD-10-CM | POA: Diagnosis not present

## 2014-05-02 DIAGNOSIS — Z79899 Other long term (current) drug therapy: Secondary | ICD-10-CM

## 2014-05-02 DIAGNOSIS — R32 Unspecified urinary incontinence: Secondary | ICD-10-CM | POA: Diagnosis present

## 2014-05-02 DIAGNOSIS — R1110 Vomiting, unspecified: Secondary | ICD-10-CM | POA: Diagnosis not present

## 2014-05-02 DIAGNOSIS — Z7902 Long term (current) use of antithrombotics/antiplatelets: Secondary | ICD-10-CM

## 2014-05-02 DIAGNOSIS — I129 Hypertensive chronic kidney disease with stage 1 through stage 4 chronic kidney disease, or unspecified chronic kidney disease: Secondary | ICD-10-CM | POA: Diagnosis present

## 2014-05-02 DIAGNOSIS — Z853 Personal history of malignant neoplasm of breast: Secondary | ICD-10-CM | POA: Diagnosis not present

## 2014-05-02 DIAGNOSIS — R109 Unspecified abdominal pain: Secondary | ICD-10-CM | POA: Diagnosis not present

## 2014-05-02 DIAGNOSIS — K458 Other specified abdominal hernia without obstruction or gangrene: Secondary | ICD-10-CM | POA: Diagnosis not present

## 2014-05-02 DIAGNOSIS — R112 Nausea with vomiting, unspecified: Secondary | ICD-10-CM | POA: Diagnosis not present

## 2014-05-02 DIAGNOSIS — K2970 Gastritis, unspecified, without bleeding: Secondary | ICD-10-CM | POA: Diagnosis not present

## 2014-05-02 DIAGNOSIS — E118 Type 2 diabetes mellitus with unspecified complications: Secondary | ICD-10-CM | POA: Diagnosis not present

## 2014-05-02 DIAGNOSIS — N281 Cyst of kidney, acquired: Secondary | ICD-10-CM | POA: Diagnosis not present

## 2014-05-02 HISTORY — DX: Malignant neoplasm of unspecified site of unspecified female breast: C50.919

## 2014-05-02 HISTORY — DX: Type 2 diabetes mellitus without complications: E11.9

## 2014-05-02 LAB — COMPREHENSIVE METABOLIC PANEL
ALT: 11 U/L (ref 0–35)
AST: 17 U/L (ref 0–37)
Albumin: 3.4 g/dL — ABNORMAL LOW (ref 3.5–5.2)
Alkaline Phosphatase: 70 U/L (ref 39–117)
Anion gap: 11 (ref 5–15)
BILIRUBIN TOTAL: 0.6 mg/dL (ref 0.3–1.2)
BUN: 30 mg/dL — AB (ref 6–23)
CHLORIDE: 105 mmol/L (ref 96–112)
CO2: 24 mmol/L (ref 19–32)
CREATININE: 1.53 mg/dL — AB (ref 0.50–1.10)
Calcium: 8.6 mg/dL (ref 8.4–10.5)
GFR, EST AFRICAN AMERICAN: 33 mL/min — AB (ref >90)
GFR, EST NON AFRICAN AMERICAN: 28 mL/min — AB (ref >90)
Glucose, Bld: 110 mg/dL — ABNORMAL HIGH (ref 70–99)
Potassium: 3.2 mmol/L — ABNORMAL LOW (ref 3.5–5.1)
Sodium: 140 mmol/L (ref 135–145)
Total Protein: 6.2 g/dL (ref 6.0–8.3)

## 2014-05-02 LAB — URINALYSIS, ROUTINE W REFLEX MICROSCOPIC
BILIRUBIN URINE: NEGATIVE
Glucose, UA: NEGATIVE mg/dL
HGB URINE DIPSTICK: NEGATIVE
Ketones, ur: NEGATIVE mg/dL
Nitrite: NEGATIVE
PROTEIN: 30 mg/dL — AB
SPECIFIC GRAVITY, URINE: 1.014 (ref 1.005–1.030)
Urobilinogen, UA: 0.2 mg/dL (ref 0.0–1.0)
pH: 5.5 (ref 5.0–8.0)

## 2014-05-02 LAB — URINE MICROSCOPIC-ADD ON

## 2014-05-02 LAB — CBC WITH DIFFERENTIAL/PLATELET
BASOS ABS: 0 10*3/uL (ref 0.0–0.1)
BASOS PCT: 0 % (ref 0–1)
EOS ABS: 0.1 10*3/uL (ref 0.0–0.7)
Eosinophils Relative: 1 % (ref 0–5)
HCT: 37.5 % (ref 36.0–46.0)
Hemoglobin: 11.4 g/dL — ABNORMAL LOW (ref 12.0–15.0)
LYMPHS ABS: 1 10*3/uL (ref 0.7–4.0)
Lymphocytes Relative: 10 % — ABNORMAL LOW (ref 12–46)
MCH: 25.7 pg — AB (ref 26.0–34.0)
MCHC: 30.4 g/dL (ref 30.0–36.0)
MCV: 84.5 fL (ref 78.0–100.0)
Monocytes Absolute: 0.7 10*3/uL (ref 0.1–1.0)
Monocytes Relative: 6 % (ref 3–12)
NEUTROS PCT: 83 % — AB (ref 43–77)
Neutro Abs: 8.7 10*3/uL — ABNORMAL HIGH (ref 1.7–7.7)
Platelets: 329 10*3/uL (ref 150–400)
RBC: 4.44 MIL/uL (ref 3.87–5.11)
RDW: 17 % — AB (ref 11.5–15.5)
WBC: 10.6 10*3/uL — ABNORMAL HIGH (ref 4.0–10.5)

## 2014-05-02 LAB — LIPASE, BLOOD: Lipase: 34 U/L (ref 11–59)

## 2014-05-02 MED ORDER — ONDANSETRON HCL 4 MG/2ML IJ SOLN
4.0000 mg | Freq: Once | INTRAMUSCULAR | Status: AC
  Administered 2014-05-02: 4 mg via INTRAVENOUS
  Filled 2014-05-02: qty 2

## 2014-05-02 MED ORDER — FENTANYL CITRATE 0.05 MG/ML IJ SOLN
25.0000 ug | Freq: Once | INTRAMUSCULAR | Status: AC
  Administered 2014-05-02: 25 ug via INTRAVENOUS
  Filled 2014-05-02: qty 2

## 2014-05-02 MED ORDER — SODIUM CHLORIDE 0.9 % IV SOLN
Freq: Once | INTRAVENOUS | Status: AC
  Administered 2014-05-02: 21:00:00 via INTRAVENOUS

## 2014-05-02 NOTE — ED Notes (Signed)
Per EMS pt from home c/o epigastric pain onset yesterday 1800, 1 episode of emesis today at 1600, pt states she has Hx of adhesions and hernia. Per EMS pt is A & O x 4.

## 2014-05-02 NOTE — ED Notes (Signed)
Bed: RESB Expected date:  Expected time:  Means of arrival:  Comments: EMS 49F epigastric pain, n/v x24 hours

## 2014-05-02 NOTE — ED Provider Notes (Signed)
CSN: [id number]     Arrival date & time 05/02/14  1856 History   First MD Initiated Contact with Patient 05/02/14 1942     Chief Complaint  Patient presents with  . Abdominal Pain    HPI   81 year old female presents with one-day history of abdominal pain. Patient reports that 20 years ago she had a cholecystectomy with subsequent small bowel tractions every 2 years. Reports her most recent one was 2 years prior that required hospitalization. She states she started with epigastric pain this morning that has increased in intensity, no radiation of symptoms. She also notes suprapubic tenderness for the past 3 days patient reports her last normal bowel movement was this morning followed by about diarrhea. Nausea p.m. this afternoon with multiple episodes of nonbloody vomiting. Patient reports her last meal was today at lunch. Patient denies radiation to the back, headache, chest pain, shortness breath, dizziness, diaphoresis, fever, or any swelling of the distal extremities. She reports she is incontinent at baseline he cannot comment on changes in her bladder frequency. Patient is complaining by her husband and daughter. She reports a history of uncontrolled hypertension requiring multiple agents.   Past Medical History  Diagnosis Date  . Diabetes mellitus without complication   . Cancer   . Breast CA    History reviewed. No pertinent past surgical history. History reviewed. No pertinent family history. History  Substance Use Topics  . Smoking status: Never Smoker   . Smokeless tobacco: Not on file  . Alcohol Use: No   OB History    No data available     Review of Systems  All other systems reviewed and are negative.   Allergies  Iodine and Tape  Home Medications   Prior to Admission medications   Medication Sig Start Date End Date Taking? Authorizing Provider  acetaminophen (TYLENOL) 500 MG tablet Take 1,000 mg by mouth every 6 (six) hours as needed for moderate pain or  headache.   Yes Historical Provider, MD  allopurinol (ZYLOPRIM) 100 MG tablet Take 100 mg by mouth daily.   Yes Historical Provider, MD  amLODipine (NORVASC) 10 MG tablet Take 10 mg by mouth 2 (two) times daily.   Yes Historical Provider, MD  aspirin EC 81 MG tablet Take 81 mg by mouth daily.   Yes Historical Provider, MD  calcitRIOL (ROCALTROL) 0.25 MCG capsule Take 0.25 mcg by mouth daily.   Yes Historical Provider, MD  cloNIDine (CATAPRES) 0.2 MG tablet Take 0.2 mg by mouth 2 (two) times daily.   Yes Historical Provider, MD  clopidogrel (PLAVIX) 75 MG tablet Take 75 mg by mouth daily.   Yes Historical Provider, MD  colchicine 0.6 MG tablet Take 0.6 mg by mouth daily.   Yes Historical Provider, MD  Dorzolamide HCl-Timolol Mal PF 22.3-6.8 MG/ML SOLN Apply 1 drop to eye at bedtime.   Yes Historical Provider, MD  ferrous sulfate 325 (65 FE) MG tablet Take 325 mg by mouth daily with breakfast.   Yes Historical Provider, MD  furosemide (LASIX) 20 MG tablet Take 20 mg by mouth daily.   Yes Historical Provider, MD  glimepiride (AMARYL) 2 MG tablet Take 2 mg by mouth daily with breakfast.   Yes Historical Provider, MD  glucose blood test strip 1 each by Other route daily. Use as instructed   Yes Historical Provider, MD  hydrALAZINE (APRESOLINE) 100 MG tablet Take 100 mg by mouth 2 (two) times daily.   Yes Historical Provider, MD  hydrALAZINE (APRESOLINE) 50  MG tablet Take 50 mg by mouth 2 (two) times daily.   Yes Historical Provider, MD  labetalol (NORMODYNE) 200 MG tablet Take 200 mg by mouth 2 (two) times daily.   Yes Historical Provider, MD  levothyroxine (SYNTHROID, LEVOTHROID) 150 MCG tablet Take 150 mcg by mouth daily before breakfast.   Yes Historical Provider, MD  Melatonin 1 MG TABS Take 1 tablet by mouth at bedtime as needed (sleep).   Yes Historical Provider, MD  Multiple Vitamins-Minerals (PRESERVISION/LUTEIN PO) Take 1 tablet by mouth 2 (two) times daily.   Yes Historical Provider, MD   pantoprazole (PROTONIX) 40 MG tablet Take 40 mg by mouth daily.   Yes Historical Provider, MD  potassium chloride SA (K-DUR,KLOR-CON) 20 MEQ tablet Take 20 mEq by mouth daily.   Yes Historical Provider, MD  tamoxifen (NOLVADEX) 10 MG tablet Take 10 mg by mouth daily.   Yes Historical Provider, MD  travoprost, benzalkonium, (TRAVATAN) 0.004 % ophthalmic solution Place 1 drop into both eyes at bedtime.   Yes Historical Provider, MD   BP 183/56 mmHg  Pulse 71  Temp(Src) 98.2 F (36.8 C) (Oral)  Resp 20  SpO2 92% Physical Exam  Constitutional: She is oriented to person, place, and time. She appears well-developed and well-nourished.  HENT:  Head: Normocephalic and atraumatic.  Eyes: Pupils are equal, round, and reactive to light.  Neck: Normal range of motion. Neck supple. No JVD present. No tracheal deviation present. No thyromegaly present.  Cardiovascular: Normal rate, regular rhythm, normal heart sounds and intact distal pulses.  Exam reveals no gallop and no friction rub.   No murmur heard. Pulmonary/Chest: Effort normal and breath sounds normal. No stridor. No respiratory distress. She has no wheezes. She has no rales. She exhibits no tenderness.  Abdominal: Soft. She exhibits distension. She exhibits no pulsatile midline mass. There is generalized tenderness.  Musculoskeletal: Normal range of motion.  Lymphadenopathy:    She has no cervical adenopathy.  Neurological: She is alert and oriented to person, place, and time. Coordination normal.  Skin: Skin is warm and dry.  Psychiatric: She has a normal mood and affect. Her behavior is normal. Judgment and thought content normal.  Nursing note and vitals reviewed.   ED Course  Procedures (including critical care time) Labs Review Labs Reviewed  CBC WITH DIFFERENTIAL/PLATELET - Abnormal; Notable for the following:    WBC 10.6 (*)    Hemoglobin 11.4 (*)    MCH 25.7 (*)    RDW 17.0 (*)    Neutrophils Relative % 83 (*)    Neutro  Abs 8.7 (*)    Lymphocytes Relative 10 (*)    All other components within normal limits  COMPREHENSIVE METABOLIC PANEL - Abnormal; Notable for the following:    Potassium 3.2 (*)    Glucose, Bld 110 (*)    BUN 30 (*)    Creatinine, Ser 1.53 (*)    Albumin 3.4 (*)    GFR calc non Af Amer 28 (*)    GFR calc Af Amer 33 (*)    All other components within normal limits  URINALYSIS, ROUTINE W REFLEX MICROSCOPIC - Abnormal; Notable for the following:    APPearance TURBID (*)    Protein, ur 30 (*)    Leukocytes, UA TRACE (*)    All other components within normal limits  URINE MICROSCOPIC-ADD ON - Abnormal; Notable for the following:    Squamous Epithelial / LPF MANY (*)    Bacteria, UA FEW (*)  All other components within normal limits  LIPASE, BLOOD    Imaging Review No results found.   EKG Interpretation None     MDM   Final diagnoses:  None   Labs: Urinalysis normal, CBC, CMP hypokalemia, lipase normal  Imaging: CT abdomen and pelvis with contrast showed probable partial small bowel obstruction with dilated small bowel loops in the central abdomen or discrete transition point seen  Consults: Dr. Humphrey Rolls hospitalist  Therapeutics: Ardeen Jourdain, Zofran, NG tube  Assessment:Small bowel obstruction  Plan:Patient's presentation likely small bowel obstruction. Labs and CT, concerning for any other acute problems. Patient's pain was managed with fentanyl, nausea with Zofran. She appeared comfortable throughout her stay in the ED hospitalist service was contacted and accepted the patient for admittance. No acute need for surgical evaluation at this time.      Okey Regal, PA-C 05/03/14 9826  Noemi Chapel, MD 05/03/14 1005

## 2014-05-02 NOTE — ED Notes (Signed)
Pt does not feel ready to give UA

## 2014-05-02 NOTE — ED Provider Notes (Signed)
81 year old female, prior surgery, presents with abdominal pain which started last night, this is associated with very loose stools until this afternoon and now she is having increased abdominal pain, distention, no gas, no stools and is nauseated. She has had very little to eat today and on exam she has a distended abdomen which is tympanitic to percussion, tender in the right mid abdomen, no guarding, no peritoneal signs. Heart and lungs appear normal, the patient is able to give a clear history and follow commands without difficulty.  She will need evaluation for intra-abdominal emergencies including bowel obstruction, CT scan labs pending.  Medical screening examination/treatment/procedure(s) were conducted as a shared visit with non-physician practitioner(s) and myself.  I personally evaluated the patient during the encounter.  Clinical Impression:   Final diagnoses:  Small bowel obstruction         Noemi Chapel, MD 05/03/14 1004

## 2014-05-03 ENCOUNTER — Inpatient Hospital Stay (HOSPITAL_COMMUNITY): Payer: Medicare Other

## 2014-05-03 DIAGNOSIS — E860 Dehydration: Secondary | ICD-10-CM | POA: Diagnosis present

## 2014-05-03 DIAGNOSIS — E119 Type 2 diabetes mellitus without complications: Secondary | ICD-10-CM | POA: Diagnosis not present

## 2014-05-03 DIAGNOSIS — I739 Peripheral vascular disease, unspecified: Secondary | ICD-10-CM | POA: Diagnosis not present

## 2014-05-03 DIAGNOSIS — K5669 Other intestinal obstruction: Secondary | ICD-10-CM | POA: Diagnosis not present

## 2014-05-03 DIAGNOSIS — N183 Chronic kidney disease, stage 3 unspecified: Secondary | ICD-10-CM | POA: Diagnosis present

## 2014-05-03 DIAGNOSIS — C50919 Malignant neoplasm of unspecified site of unspecified female breast: Secondary | ICD-10-CM | POA: Diagnosis present

## 2014-05-03 DIAGNOSIS — R109 Unspecified abdominal pain: Secondary | ICD-10-CM | POA: Diagnosis not present

## 2014-05-03 DIAGNOSIS — Z7982 Long term (current) use of aspirin: Secondary | ICD-10-CM | POA: Diagnosis not present

## 2014-05-03 DIAGNOSIS — R32 Unspecified urinary incontinence: Secondary | ICD-10-CM | POA: Diagnosis present

## 2014-05-03 DIAGNOSIS — Z9049 Acquired absence of other specified parts of digestive tract: Secondary | ICD-10-CM | POA: Diagnosis not present

## 2014-05-03 DIAGNOSIS — K5660 Unspecified intestinal obstruction: Secondary | ICD-10-CM | POA: Diagnosis not present

## 2014-05-03 DIAGNOSIS — E039 Hypothyroidism, unspecified: Secondary | ICD-10-CM | POA: Diagnosis present

## 2014-05-03 DIAGNOSIS — Z170 Estrogen receptor positive status [ER+]: Secondary | ICD-10-CM | POA: Diagnosis not present

## 2014-05-03 DIAGNOSIS — Z7902 Long term (current) use of antithrombotics/antiplatelets: Secondary | ICD-10-CM | POA: Diagnosis not present

## 2014-05-03 DIAGNOSIS — E876 Hypokalemia: Secondary | ICD-10-CM | POA: Diagnosis present

## 2014-05-03 DIAGNOSIS — N184 Chronic kidney disease, stage 4 (severe): Secondary | ICD-10-CM | POA: Diagnosis present

## 2014-05-03 DIAGNOSIS — Z96642 Presence of left artificial hip joint: Secondary | ICD-10-CM | POA: Diagnosis present

## 2014-05-03 DIAGNOSIS — E118 Type 2 diabetes mellitus with unspecified complications: Secondary | ICD-10-CM | POA: Diagnosis not present

## 2014-05-03 DIAGNOSIS — I5032 Chronic diastolic (congestive) heart failure: Secondary | ICD-10-CM | POA: Diagnosis not present

## 2014-05-03 DIAGNOSIS — Z79899 Other long term (current) drug therapy: Secondary | ICD-10-CM | POA: Diagnosis not present

## 2014-05-03 DIAGNOSIS — K5790 Diverticulosis of intestine, part unspecified, without perforation or abscess without bleeding: Secondary | ICD-10-CM | POA: Diagnosis present

## 2014-05-03 DIAGNOSIS — I129 Hypertensive chronic kidney disease with stage 1 through stage 4 chronic kidney disease, or unspecified chronic kidney disease: Secondary | ICD-10-CM | POA: Diagnosis present

## 2014-05-03 LAB — CBC
HCT: 35.1 % — ABNORMAL LOW (ref 36.0–46.0)
HEMOGLOBIN: 10.5 g/dL — AB (ref 12.0–15.0)
MCH: 25.6 pg — AB (ref 26.0–34.0)
MCHC: 29.9 g/dL — AB (ref 30.0–36.0)
MCV: 85.6 fL (ref 78.0–100.0)
Platelets: 259 10*3/uL (ref 150–400)
RBC: 4.1 MIL/uL (ref 3.87–5.11)
RDW: 16.9 % — AB (ref 11.5–15.5)
WBC: 10.2 10*3/uL (ref 4.0–10.5)

## 2014-05-03 LAB — COMPREHENSIVE METABOLIC PANEL
ALBUMIN: 2.9 g/dL — AB (ref 3.5–5.2)
ALT: 9 U/L (ref 0–35)
ANION GAP: 12 (ref 5–15)
AST: 16 U/L (ref 0–37)
Alkaline Phosphatase: 66 U/L (ref 39–117)
BUN: 24 mg/dL — AB (ref 6–23)
CHLORIDE: 110 mmol/L (ref 96–112)
CO2: 23 mmol/L (ref 19–32)
CREATININE: 1.3 mg/dL — AB (ref 0.50–1.10)
Calcium: 8.2 mg/dL — ABNORMAL LOW (ref 8.4–10.5)
GFR calc Af Amer: 40 mL/min — ABNORMAL LOW (ref >90)
GFR, EST NON AFRICAN AMERICAN: 35 mL/min — AB (ref >90)
Glucose, Bld: 91 mg/dL (ref 70–99)
POTASSIUM: 3.5 mmol/L (ref 3.5–5.1)
SODIUM: 145 mmol/L (ref 135–145)
Total Bilirubin: 0.6 mg/dL (ref 0.3–1.2)
Total Protein: 5.4 g/dL — ABNORMAL LOW (ref 6.0–8.3)

## 2014-05-03 MED ORDER — LEVOTHYROXINE SODIUM 150 MCG PO TABS
150.0000 ug | ORAL_TABLET | Freq: Every day | ORAL | Status: DC
  Administered 2014-05-03 – 2014-05-05 (×3): 150 ug via ORAL
  Filled 2014-05-03 (×5): qty 1

## 2014-05-03 MED ORDER — DORZOLAMIDE HCL-TIMOLOL MAL 2-0.5 % OP SOLN
1.0000 [drp] | Freq: Every day | OPHTHALMIC | Status: DC
  Administered 2014-05-03 – 2014-05-04 (×3): 1 [drp] via OPHTHALMIC
  Filled 2014-05-03: qty 10

## 2014-05-03 MED ORDER — CLONIDINE HCL 0.2 MG PO TABS
0.2000 mg | ORAL_TABLET | Freq: Two times a day (BID) | ORAL | Status: DC
  Administered 2014-05-03 – 2014-05-05 (×5): 0.2 mg via ORAL
  Filled 2014-05-03 (×6): qty 1

## 2014-05-03 MED ORDER — AMLODIPINE BESYLATE 10 MG PO TABS
10.0000 mg | ORAL_TABLET | Freq: Two times a day (BID) | ORAL | Status: DC
  Administered 2014-05-03 – 2014-05-05 (×5): 10 mg via ORAL
  Filled 2014-05-03 (×6): qty 1

## 2014-05-03 MED ORDER — ADULT MULTIVITAMIN W/MINERALS CH
1.0000 | ORAL_TABLET | Freq: Every day | ORAL | Status: DC
Start: 2014-05-03 — End: 2014-05-05
  Administered 2014-05-03 – 2014-05-05 (×3): 1 via ORAL
  Filled 2014-05-03 (×3): qty 1

## 2014-05-03 MED ORDER — TAMOXIFEN CITRATE 10 MG PO TABS
10.0000 mg | ORAL_TABLET | Freq: Every day | ORAL | Status: DC
  Administered 2014-05-04: 10 mg via ORAL
  Filled 2014-05-03 (×3): qty 1

## 2014-05-03 MED ORDER — ONDANSETRON HCL 4 MG PO TABS: 4.0000 mg | ORAL_TABLET | Freq: Four times a day (QID) | ORAL | Status: DC | PRN

## 2014-05-03 MED ORDER — POTASSIUM CHLORIDE 10 MEQ/100ML IV SOLN
10.0000 meq | INTRAVENOUS | Status: AC
Start: 2014-05-03 — End: 2014-05-03
  Administered 2014-05-03 (×3): 10 meq via INTRAVENOUS
  Filled 2014-05-03 (×3): qty 100

## 2014-05-03 MED ORDER — SODIUM CHLORIDE 0.9 % IV SOLN
INTRAVENOUS | Status: DC
  Administered 2014-05-03: 03:00:00 via INTRAVENOUS

## 2014-05-03 MED ORDER — CLONIDINE HCL 0.1 MG PO TABS
0.1000 mg | ORAL_TABLET | Freq: Once | ORAL | Status: AC
  Administered 2014-05-03: 0.1 mg via ORAL
  Filled 2014-05-03: qty 1

## 2014-05-03 MED ORDER — HYDRALAZINE HCL 50 MG PO TABS
50.0000 mg | ORAL_TABLET | Freq: Two times a day (BID) | ORAL | Status: DC
  Administered 2014-05-03 – 2014-05-05 (×6): 50 mg via ORAL
  Filled 2014-05-03 (×7): qty 1

## 2014-05-03 MED ORDER — VITAMIN B-1 100 MG PO TABS
100.0000 mg | ORAL_TABLET | Freq: Every day | ORAL | Status: DC
  Administered 2014-05-03 – 2014-05-05 (×3): 100 mg via ORAL
  Filled 2014-05-03 (×3): qty 1

## 2014-05-03 MED ORDER — PANTOPRAZOLE SODIUM 40 MG PO TBEC
40.0000 mg | DELAYED_RELEASE_TABLET | Freq: Every day | ORAL | Status: DC
  Administered 2014-05-03 – 2014-05-05 (×3): 40 mg via ORAL
  Filled 2014-05-03 (×3): qty 1

## 2014-05-03 MED ORDER — COLCHICINE 0.6 MG PO TABS
0.6000 mg | ORAL_TABLET | Freq: Every day | ORAL | Status: DC
  Administered 2014-05-03 – 2014-05-04 (×2): 0.6 mg via ORAL
  Filled 2014-05-03 (×3): qty 1

## 2014-05-03 MED ORDER — FOLIC ACID 1 MG PO TABS
1.0000 mg | ORAL_TABLET | Freq: Every day | ORAL | Status: DC
  Administered 2014-05-03 – 2014-05-05 (×3): 1 mg via ORAL
  Filled 2014-05-03 (×3): qty 1

## 2014-05-03 MED ORDER — ONDANSETRON HCL 4 MG/2ML IJ SOLN
4.0000 mg | Freq: Four times a day (QID) | INTRAMUSCULAR | Status: DC | PRN
  Administered 2014-05-03: 4 mg via INTRAVENOUS
  Filled 2014-05-03: qty 2

## 2014-05-03 MED ORDER — LATANOPROST 0.005 % OP SOLN
1.0000 [drp] | Freq: Every day | OPHTHALMIC | Status: DC
  Administered 2014-05-03 – 2014-05-04 (×3): 1 [drp] via OPHTHALMIC
  Filled 2014-05-03: qty 2.5

## 2014-05-03 MED ORDER — HYDROMORPHONE HCL 1 MG/ML IJ SOLN
0.5000 mg | INTRAMUSCULAR | Status: DC | PRN
  Administered 2014-05-03 – 2014-05-04 (×3): 0.5 mg via INTRAVENOUS
  Filled 2014-05-03 (×3): qty 1

## 2014-05-03 MED ORDER — CALCITRIOL 0.25 MCG PO CAPS
0.2500 ug | ORAL_CAPSULE | Freq: Every day | ORAL | Status: DC
  Administered 2014-05-03 – 2014-05-05 (×3): 0.25 ug via ORAL
  Filled 2014-05-03 (×3): qty 1

## 2014-05-03 MED ORDER — FUROSEMIDE 20 MG PO TABS
20.0000 mg | ORAL_TABLET | Freq: Every day | ORAL | Status: DC
  Administered 2014-05-03 – 2014-05-05 (×3): 20 mg via ORAL
  Filled 2014-05-03 (×3): qty 1

## 2014-05-03 MED ORDER — CETYLPYRIDINIUM CHLORIDE 0.05 % MT LIQD: 7.0000 mL | Freq: Two times a day (BID) | OROMUCOSAL | Status: DC

## 2014-05-03 MED ORDER — LABETALOL HCL 200 MG PO TABS
200.0000 mg | ORAL_TABLET | Freq: Two times a day (BID) | ORAL | Status: DC
  Administered 2014-05-03 – 2014-05-05 (×5): 200 mg via ORAL
  Filled 2014-05-03 (×6): qty 1

## 2014-05-03 MED ORDER — CHLORHEXIDINE GLUCONATE 0.12 % MT SOLN
15.0000 mL | Freq: Two times a day (BID) | OROMUCOSAL | Status: DC
  Administered 2014-05-03 – 2014-05-05 (×4): 15 mL via OROMUCOSAL
  Filled 2014-05-03 (×7): qty 15

## 2014-05-03 MED ORDER — ALLOPURINOL 100 MG PO TABS
100.0000 mg | ORAL_TABLET | Freq: Every day | ORAL | Status: DC
Start: 2014-05-03 — End: 2014-05-05
  Administered 2014-05-03 – 2014-05-05 (×3): 100 mg via ORAL
  Filled 2014-05-03 (×3): qty 1

## 2014-05-03 NOTE — H&P (Signed)
[facility] History and Physical  Amanda Cunningham EVO:[id number] DOB: Sep 21, 1919 DOA: 05/02/2014  Referring physician: Phillips Climes, PA PCP: Joycelyn Man, MD   Chief Complaint: Abdominal Pain  HPI: Amanda Cunningham is a 81 y.o. female presents with abdominal pain. Patient has been having vomiting and abdominal pain which started around 6 PM yesterday,. She states that she was not able to keep anything down. She has noted diarrhea also. Patient states that she had associated gas also. She states that the pain is located in the right side. Does not appear to radiate to the back. She has no blood in the stools but she states that she is also taking iron. She states that she has had these symptoms at least 8 times over the years. She states that she gets these attacks 2-3 times per year and relates it to gall bladder surgery. Patient states that she has a lot of adhesions in her stomach. She has no fevers or chills. She has no headaches noted. She also is a diabetic and she states that she is well controlled   Review of Systems:  Complete 12 point ROS performed and is unremarkable other than HPI  Past Medical History  Diagnosis Date  . Diabetes mellitus without complication   . Cancer   . Breast CA    History reviewed. No pertinent past surgical history. Social History:  reports that she has never smoked. She does not have any smokeless tobacco history on file. She reports that she does not drink alcohol or use illicit drugs.  Allergies  Allergen Reactions  . Iodine Hives    Shock  . Tape Rash    History reviewed. No pertinent family history.   Prior to Admission medications   Medication Sig Start Date End Date Taking? Authorizing Provider  acetaminophen (TYLENOL) 500 MG tablet Take 1,000 mg by mouth every 6 (six) hours as needed for moderate pain or headache.   Yes Historical Provider, MD  allopurinol (ZYLOPRIM) 100 MG tablet Take 100 mg by mouth daily.   Yes Historical  Provider, MD  amLODipine (NORVASC) 10 MG tablet Take 10 mg by mouth 2 (two) times daily.   Yes Historical Provider, MD  aspirin EC 81 MG tablet Take 81 mg by mouth daily.   Yes Historical Provider, MD  calcitRIOL (ROCALTROL) 0.25 MCG capsule Take 0.25 mcg by mouth daily.   Yes Historical Provider, MD  cloNIDine (CATAPRES) 0.2 MG tablet Take 0.2 mg by mouth 2 (two) times daily.   Yes Historical Provider, MD  clopidogrel (PLAVIX) 75 MG tablet Take 75 mg by mouth daily.   Yes Historical Provider, MD  colchicine 0.6 MG tablet Take 0.6 mg by mouth daily.   Yes Historical Provider, MD  Dorzolamide HCl-Timolol Mal PF 22.3-6.8 MG/ML SOLN Apply 1 drop to eye at bedtime.   Yes Historical Provider, MD  ferrous sulfate 325 (65 FE) MG tablet Take 325 mg by mouth daily with breakfast.   Yes Historical Provider, MD  furosemide (LASIX) 20 MG tablet Take 20 mg by mouth daily.   Yes Historical Provider, MD  glimepiride (AMARYL) 2 MG tablet Take 2 mg by mouth daily with breakfast.   Yes Historical Provider, MD  glucose blood test strip 1 each by Other route daily. Use as instructed   Yes Historical Provider, MD  hydrALAZINE (APRESOLINE) 100 MG tablet Take 100 mg by mouth 2 (two) times daily.   Yes Historical Provider, MD  hydrALAZINE (APRESOLINE) 50 MG tablet Take 50 mg by mouth  2 (two) times daily.   Yes Historical Provider, MD  labetalol (NORMODYNE) 200 MG tablet Take 200 mg by mouth 2 (two) times daily.   Yes Historical Provider, MD  levothyroxine (SYNTHROID, LEVOTHROID) 150 MCG tablet Take 150 mcg by mouth daily before breakfast.   Yes Historical Provider, MD  Melatonin 1 MG TABS Take 1 tablet by mouth at bedtime as needed (sleep).   Yes Historical Provider, MD  Multiple Vitamins-Minerals (PRESERVISION/LUTEIN PO) Take 1 tablet by mouth 2 (two) times daily.   Yes Historical Provider, MD  pantoprazole (PROTONIX) 40 MG tablet Take 40 mg by mouth daily.   Yes Historical Provider, MD  potassium chloride SA  (K-DUR,KLOR-CON) 20 MEQ tablet Take 20 mEq by mouth daily.   Yes Historical Provider, MD  tamoxifen (NOLVADEX) 10 MG tablet Take 10 mg by mouth daily.   Yes Historical Provider, MD  travoprost, benzalkonium, (TRAVATAN) 0.004 % ophthalmic solution Place 1 drop into both eyes at bedtime.   Yes Historical Provider, MD   Physical Exam: Filed Vitals:   05/02/14 1918 05/02/14 2229 05/03/14 0000  BP: 183/56 208/55 184/54  Pulse: 71 90 68  Temp: 98.2 F (36.8 C) 98 F (36.7 C)   TempSrc: Oral Oral   Resp: 20 18 17   SpO2: 92% 96% 93%    Wt Readings from Last 3 Encounters:  No data found for Wt    General:  Appears calm and comfortable Eyes: PERRL, normal lids, irises & conjunctiva ENT: grossly normal hearing, lips & tongue Neck: no LAD, masses or thyromegaly Cardiovascular: RRR, no m/r/g Respiratory: CTA bilaterally, no w/r/r. Normal respiratory effort. Abdomen: soft, Right lower tenderness no rebound noted Skin: no rash or induration seen on limited exam Musculoskeletal: grossly normal tone BUE/BLE Psychiatric: grossly normal mood and affect, speech fluent and appropriate Neurologic: grossly non-focal.          Labs on Admission:  Basic Metabolic Panel:  Recent Labs Lab 05/02/14 1921  NA 140  K 3.2*  CL 105  CO2 24  GLUCOSE 110*  BUN 30*  CREATININE 1.53*  CALCIUM 8.6   Liver Function Tests:  Recent Labs Lab 05/02/14 1921  AST 17  ALT 11  ALKPHOS 70  BILITOT 0.6  PROT 6.2  ALBUMIN 3.4*    Recent Labs Lab 05/02/14 1921  LIPASE 34   No results for input(s): AMMONIA in the last 168 hours. CBC:  Recent Labs Lab 05/02/14 1921  WBC 10.6*  NEUTROABS 8.7*  HGB 11.4*  HCT 37.5  MCV 84.5  PLT 329   Cardiac Enzymes: No results for input(s): CKTOTAL, CKMB, CKMBINDEX, TROPONINI in the last 168 hours.  BNP (last 3 results) No results for input(s): BNP in the last 8760 hours.  ProBNP (last 3 results) No results for input(s): PROBNP in the last 8760  hours.  CBG: No results for input(s): GLUCAP in the last 168 hours.  Radiological Exams on Admission: Ct Abdomen Pelvis Wo Contrast  05/02/2014   CLINICAL DATA:  81 year old female with epigastric abdominal pain since yesterday. Episode of emesis. History of breast cancer.  EXAM: CT ABDOMEN AND PELVIS WITHOUT CONTRAST  TECHNIQUE: Multidetector CT imaging of the abdomen and pelvis was performed following the standard protocol without IV contrast.  COMPARISON:  None.  FINDINGS: Linear atelectasis in the right and left lower lobe. The heart is enlarged. Coronary artery calcifications are seen.  Oral contrast within mildly distended stomach. No oral contrast has passed beyond the stomach. There dilated fluid-filled bowel loops in  the central lower abdomen measuring up to 4.2 cm. Distal small bowel loops are decompressed. Dilated bowel loops approach fat containing anterior abdominal wall hernia, however there is no bowel involvement of the hernia. Multiple fat containing anterior abdominal wall hernias are seen. Dilated bowel loops are seen distal and proximal to the hernia sac. A discrete transition point is not seen. Cecum is high-riding in the mid abdomen. There scattered colonic diverticulosis throughout the entire colon without diverticulitis. Bowel evaluation in the pelvis is limited due to streak artifact from left hip replacement.  Clips in the gallbladder fossa from cholecystectomy. No evidence of focal hepatic lesion allowing for noncontrast exam. The spleen is normal. There is mild thickening of the left adrenal gland without discrete adrenal nodule. The right adrenal gland is normal.  There are innumerable cysts within the left kidney at varying sizes, the largest exophytic cyst posteriorly measures 7.1 x 6.0 cm. Anteriorly in the left upper pole is a 7 mm hyperdense lesion. There is a probable parapelvic cyst, the ureter is nondilated.  There is thinning of the right renal parenchyma. Within the  lower right kidney is a 4.2 x 2.3 cm partially exophytic lesion measuring soft tissue density. Probable scarring in the upper right kidney. There is a 6 mm hyperdense lesion in the upper right kidney. Probable renovascular calcifications at both renal hila versus less likely nonobstructing stones.  Abdominal aorta is normal in caliber with dense atherosclerosis. There are bilateral renal artery stents in place. No retroperitoneal adenopathy.  Within the pelvis the urinary bladder is minimally distended. Large calcified lesion measures at least 8.4 cm is seen posterior to the rectum. This may reflect an exophytic uterine fibroid however is nonspecific. Associated soft tissue density in the presacral region. Detailed evaluation is limited by streak artifact from left hip prosthesis.  There is multilevel degenerative change throughout the spine. No acute or suspicious osseous abnormality.  IMPRESSION: 1. Probable partial small bowel obstruction with dilated small bowel loops in the central abdomen. A discrete transition point is not seen. 2. Multiple anterior abdominal wall hernias containing fat. There is no bowel involvement. 3. Multiple cysts within both kidneys. There are multiple hyperdense small subcentimeter lesions in both kidneys. Additionally in the lower right kidney is a 4.2 x 2.3 cm lesion measuring soft tissue density. These lesions are incompletely characterized without contrast. Further evaluation is recommended to exclude solid lesion, particularly the larger lesion in the lower right kidney. If patient is able to tolerate breath hold technique, and renal function permits, dedicated renal protocol MRI without and with contrast is recommended. If this is not feasible due to patient age, initial evaluation could be considered with ultrasound. Comparison with any prior exams would be most helpful to evaluate for stability if available from an outside institution. 4. Large calcified lesion in the pelvis,  may reflect pedunculated fibroid, however incompletely characterized given streak artifact from left hip prosthesis. 5. Incidental findings of relatively severe atherosclerosis, diverticulosis, and postcholecystectomy change.   Electronically Signed   By: Jeb Levering M.D.   On: 05/02/2014 23:38      Assessment/Plan Active Problems:   Diabetes mellitus   SBO (small bowel obstruction)   1. SBO -will keep NPO for now -NGT to Low wall suction -pain control as needed -typically she states that she resolves on her own with conservative therapy will observe -surgical consult if she worsens  2. Hypokalemia -will replace with KCL -monitor labs  3. Diabetes Mellitus -will monitor FSBS -check  A1C -currently holding her meds  4. Diverticulosis -no evidence of inflammation presently but WBC is elevated -will hold off on antibiotics for now but will have a low threshold to start  5. HTN -monitor pressures -continue with her current medications  6. Elevated Creatinine -likely dehydrated -will hydrate gently -follow up labs   Code Status: Full Code (must indicate code status--if unknown or must be presumed, indicate so) DVT Prophylaxis:SCDs Family Communication: Daughter (indicate person spoken with, if applicable, with phone number if by telephone) Disposition Plan: Home (indicate anticipated LOS)  Time spent: 53min  Doye Montilla A [facility] Pager [phone]

## 2014-05-03 NOTE — Progress Notes (Signed)
PROGRESS NOTE  [place] TSV:[id number] DOB: 1919/08/29 DOA: 05/02/2014 PCP: Joycelyn Man, MD  HPI: Amanda Cunningham is a 81 y.o. female presents with abdominal pain. Patient has been having vomiting and abdominal pain which started around 6 PM yesterday,. She states that she was not able to keep anything down. She has noted diarrhea also. Patient states that she had associated gas also. She states that the pain is located in the right side. Does not appear to radiate to the back. She has no blood in the stools but she states that she is also taking iron. She states that she has had these symptoms at least 8 times over the years.   Patient with more than 1 available chart in Epic, alternative MRN [id number].   Subjective / 24 H Interval events Feels better, passing gas, no BM yet  Assessment/Plan: Active Problems:   Diabetes mellitus   SBO (small bowel obstruction)   CKD (chronic kidney disease), stage III   Chronic diastolic heart failure   Breast cancer   DM (diabetes mellitus)   Partial SBO - recurrent episodes, did require surgery ~5 years ago for adhesion lysis - will keep NPO for now - she is passing flatus - abdominal XR with improvement, clamp the NG tube today and monitor - repeat abdominal XR in am, if continues improvement d/c NG tube - typically she states that she resolves on her own with conservative therapy will observe - surgical consult if she worsens  Hypokalemia - will replace with KCL - improved  Diabetes Mellitus - will monitor FSBS - A1C is in process - currently holding her meds  Diverticulosis - no evidence of inflammation presently  Chronic diastolic heart failure - stable, gentle hydration for now given NPO due to SBO  HTN - monitor pressures - continue with her current medications  CKD stage III-IV - at baseline currently  Relapsed breast cancer invasive lobular cancer grade 2  - ER positive PR negative HER-2 Negative -  outpatient oncology follow up with Dr. Lindi Adie  Hypothyroidism - continue synthroid   Diet: Diet NPO time specified Fluids: NS 50 cc/h DVT Prophylaxis: SCD  Code Status: Full Code Family Communication: none bedside  Disposition Plan: home when ready   Consultants:  None   Procedures:  None    Antibiotics  Anti-infectives    None       Studies  Ct Abdomen Pelvis Wo Contrast  05/02/2014   CLINICAL DATA:  81 year old female with epigastric abdominal pain since yesterday. Episode of emesis. History of breast cancer.  EXAM: CT ABDOMEN AND PELVIS WITHOUT CONTRAST  TECHNIQUE: Multidetector CT imaging of the abdomen and pelvis was performed following the standard protocol without IV contrast.  COMPARISON:  None.  FINDINGS: Linear atelectasis in the right and left lower lobe. The heart is enlarged. Coronary artery calcifications are seen.  Oral contrast within mildly distended stomach. No oral contrast has passed beyond the stomach. There dilated fluid-filled bowel loops in the central lower abdomen measuring up to 4.2 cm. Distal small bowel loops are decompressed. Dilated bowel loops approach fat containing anterior abdominal wall hernia, however there is no bowel involvement of the hernia. Multiple fat containing anterior abdominal wall hernias are seen. Dilated bowel loops are seen distal and proximal to the hernia sac. A discrete transition point is not seen. Cecum is high-riding in the mid abdomen. There scattered colonic diverticulosis throughout the entire colon without diverticulitis. Bowel evaluation in the pelvis is limited due to streak artifact  from left hip replacement.  Clips in the gallbladder fossa from cholecystectomy. No evidence of focal hepatic lesion allowing for noncontrast exam. The spleen is normal. There is mild thickening of the left adrenal gland without discrete adrenal nodule. The right adrenal gland is normal.  There are innumerable cysts within the left kidney  at varying sizes, the largest exophytic cyst posteriorly measures 7.1 x 6.0 cm. Anteriorly in the left upper pole is a 7 mm hyperdense lesion. There is a probable parapelvic cyst, the ureter is nondilated.  There is thinning of the right renal parenchyma. Within the lower right kidney is a 4.2 x 2.3 cm partially exophytic lesion measuring soft tissue density. Probable scarring in the upper right kidney. There is a 6 mm hyperdense lesion in the upper right kidney. Probable renovascular calcifications at both renal hila versus less likely nonobstructing stones.  Abdominal aorta is normal in caliber with dense atherosclerosis. There are bilateral renal artery stents in place. No retroperitoneal adenopathy.  Within the pelvis the urinary bladder is minimally distended. Large calcified lesion measures at least 8.4 cm is seen posterior to the rectum. This may reflect an exophytic uterine fibroid however is nonspecific. Associated soft tissue density in the presacral region. Detailed evaluation is limited by streak artifact from left hip prosthesis.  There is multilevel degenerative change throughout the spine. No acute or suspicious osseous abnormality.  IMPRESSION: 1. Probable partial small bowel obstruction with dilated small bowel loops in the central abdomen. A discrete transition point is not seen. 2. Multiple anterior abdominal wall hernias containing fat. There is no bowel involvement. 3. Multiple cysts within both kidneys. There are multiple hyperdense small subcentimeter lesions in both kidneys. Additionally in the lower right kidney is a 4.2 x 2.3 cm lesion measuring soft tissue density. These lesions are incompletely characterized without contrast. Further evaluation is recommended to exclude solid lesion, particularly the larger lesion in the lower right kidney. If patient is able to tolerate breath hold technique, and renal function permits, dedicated renal protocol MRI without and with contrast is  recommended. If this is not feasible due to patient age, initial evaluation could be considered with ultrasound. Comparison with any prior exams would be most helpful to evaluate for stability if available from an outside institution. 4. Large calcified lesion in the pelvis, may reflect pedunculated fibroid, however incompletely characterized given streak artifact from left hip prosthesis. 5. Incidental findings of relatively severe atherosclerosis, diverticulosis, and postcholecystectomy change.   Electronically Signed   By: Jeb Levering M.D.   On: 05/02/2014 23:38   Dg Abd Portable 1v  05/03/2014   CLINICAL DATA:  Followup small bowel obstruction.  EXAM: PORTABLE ABDOMEN - 1 VIEW  COMPARISON:  CT, 05/02/2014.  FINDINGS: No small bowel dilation is evident on the current standard radiographs. Colon is normal in caliber containing some residual contrast as well as air.  Nasogastric tube passes below the diaphragm into the proximal stomach.  There are changes from prior cholecystectomy. Densely calcified large fibroids project in the pelvis.  Bones are demineralized. There are degenerative changes throughout the visualized spine. Left hip prosthesis appears well aligned on this single AP view.  IMPRESSION: 1. No residual small bowel dilation is seen. This is consistent with an improved/resolved small bowel obstruction.   Electronically Signed   By: Lajean Manes M.D.   On: 05/03/2014 11:10    Objective  Filed Vitals:   05/03/14 0030 05/03/14 0100 05/03/14 0149 05/03/14 0556  BP: 192/60 178/105 180/99 174/86  Pulse: 75 65 65 72  Temp:   98 F (36.7 C) 98.3 F (36.8 C)  TempSrc:   Oral Oral  Resp: 23 19 18 18   Height:   5' 1"  (1.549 m)   Weight:   85.2 kg (187 lb 13.3 oz)   SpO2: 93% 92% 98% 99%    Intake/Output Summary (Last 24 hours) at 05/03/14 1254 Last data filed at 05/03/14 1057  Gross per 24 hour  Intake 356.67 ml  Output    600 ml  Net -243.33 ml   Filed Weights   05/03/14 0149    Weight: 85.2 kg (187 lb 13.3 oz)    Exam:  General:  NAD  HEENT: no scleral icterus, PERRL  Cardiovascular: RRR without MRG, 2+ peripheral pulses, no edema  Respiratory: CTA biL, good air movement, no wheezing, no crackles, no rales  Abdomen: soft, non tender, BS +, no guarding  MSK/Extremities: no clubbing/cyanosis, no joint swelling  Skin: no rashes  Neuro: non focal  Data Reviewed: Basic Metabolic Panel:  Recent Labs Lab 05/02/14 1921 05/03/14 0832  NA 140 145  K 3.2* 3.5  CL 105 110  CO2 24 23  GLUCOSE 110* 91  BUN 30* 24*  CREATININE 1.53* 1.30*  CALCIUM 8.6 8.2*   Liver Function Tests:  Recent Labs Lab 05/02/14 1921 05/03/14 0832  AST 17 16  ALT 11 9  ALKPHOS 70 66  BILITOT 0.6 0.6  PROT 6.2 5.4*  ALBUMIN 3.4* 2.9*    Recent Labs Lab 05/02/14 1921  LIPASE 34   CBC:  Recent Labs Lab 05/02/14 1921 05/03/14 1030  WBC 10.6* 10.2  NEUTROABS 8.7*  --   HGB 11.4* 10.5*  HCT 37.5 35.1*  MCV 84.5 85.6  PLT 329 259   Scheduled Meds: . allopurinol  100 mg Oral Daily  . amLODipine  10 mg Oral BID  . antiseptic oral rinse  7 mL Mouth Rinse q12n4p  . calcitRIOL  0.25 mcg Oral Daily  . chlorhexidine  15 mL Mouth Rinse BID  . cloNIDine  0.2 mg Oral BID  . colchicine  0.6 mg Oral Daily  . dorzolamide-timolol  1 drop Both Eyes QHS  . folic acid  1 mg Oral Daily  . furosemide  20 mg Oral Daily  . hydrALAZINE  50 mg Oral BID  . labetalol  200 mg Oral BID  . latanoprost  1 drop Both Eyes QHS  . levothyroxine  150 mcg Oral QAC breakfast  . multivitamin with minerals  1 tablet Oral Daily  . pantoprazole  40 mg Oral Daily  . tamoxifen  10 mg Oral Daily  . thiamine  100 mg Oral Daily   Continuous Infusions: . sodium chloride 50 mL/hr at 05/03/14 0242    Marzetta Board, MD [facility] Pager [phone]. If 7 PM - 7 AM, please contact night-coverage at [email or web address], password [email or web address] 05/03/2014, 12:54 PM  LOS: 0 days

## 2014-05-03 NOTE — ED Notes (Signed)
Dr. Humphrey Rolls made aware of pt's current BP - TO given for 0.1mg  Clonidine PO stat.

## 2014-05-03 NOTE — Progress Notes (Signed)
CARE MANAGEMENT NOTE 05/03/2014  Patient:  Amanda Cunningham,Amanda Cunningham   Account Number:  [id number]  Date Initiated:  05/03/2014  Documentation initiated by:  Edwyna Shell  Subjective/Objective Assessment:   81 yo female admitted with SBO     Action/Plan:   discharge planning   Anticipated DC Date:  05/06/2014   Anticipated DC Plan:  [place]  CM consult      Choice offered to / List presented to:             Status of service:  In process, will continue to follow Medicare Important Message given?   (If response is "NO", the following Medicare IM given date fields will be blank) Date Medicare IM given:   Medicare IM given by:   Date Additional Medicare IM given:   Additional Medicare IM given by:    Discharge Disposition:    Per UR Regulation:    If discussed at Long Length of Stay Meetings, dates discussed:    Comments:  04/04/14 Edwyna Shell RN BSN CM [phone] Patient stated that she lives in [place] with her husband but they drive to [place] every year and live with their daughter for several months. She uses a walker and a cane and has a PCP Dr. Sharene Butters. She stated that her husband drives her to appointments.

## 2014-05-04 ENCOUNTER — Ambulatory Visit: Payer: Medicare Other | Admitting: Hematology and Oncology

## 2014-05-04 ENCOUNTER — Other Ambulatory Visit: Payer: Self-pay

## 2014-05-04 ENCOUNTER — Telehealth: Payer: Self-pay | Admitting: Hematology and Oncology

## 2014-05-04 ENCOUNTER — Inpatient Hospital Stay (HOSPITAL_COMMUNITY): Payer: Medicare Other

## 2014-05-04 DIAGNOSIS — N183 Chronic kidney disease, stage 3 (moderate): Secondary | ICD-10-CM

## 2014-05-04 LAB — HEMOGLOBIN A1C
Hgb A1c MFr Bld: 5.7 % — ABNORMAL HIGH (ref 4.8–5.6)
Mean Plasma Glucose: 117 mg/dL

## 2014-05-04 NOTE — Progress Notes (Signed)
PROGRESS NOTE  [place] ZOX:[id number] DOB: [birth year] DOA: 05/02/2014 PCP: Joycelyn Man, MD  HPI: Amanda Cunningham is a 81 y.o. female presents with abdominal pain. Patient has been having vomiting and abdominal pain which started around 6 PM yesterday,. She states that she was not able to keep anything down. She has noted diarrhea also. Patient states that she had associated gas also. She states that the pain is located in the right side. Does not appear to radiate to the back. She has no blood in the stools but she states that she is also taking iron. She states that she has had these symptoms at least 8 times over the years.   Patient with more than 1 available chart in Epic, alternative MRN [id number].   Subjective / 24 H Interval events - improving, passing gas, no BM yet - no nausea/vomiting, tube clamped since last night  Assessment/Plan: Active Problems:   Diabetes mellitus   SBO (small bowel obstruction)   CKD (chronic kidney disease), stage III   Chronic diastolic heart failure   Breast cancer   DM (diabetes mellitus)   Partial SBO - recurrent episodes, did require surgery ~5 years ago for adhesion lysis - feels well with clamped NG tube, abdominal film with improvement - she is passing flatus - d/c NG tube, allow clear liquids today.  - typically she states that she resolves on her own with conservative therapy will observe - surgical consult if she worsens  Hypokalemia - will replace with KCL - improved  Diabetes Mellitus - will monitor FSBS - A1C is in process - currently holding her meds  Diverticulosis - no evidence of inflammation presently  Chronic diastolic heart failure - stable, gentle hydration for now given NPO due to SBO  HTN - monitor pressures - continue with her current medications  CKD stage III-IV - at baseline currently  Relapsed breast cancer invasive lobular cancer grade 2  - ER positive PR negative HER-2 Negative -  outpatient oncology follow up with Dr. Lindi Adie  Hypothyroidism - continue synthroid   Diet: Diet clear liquid Fluids: NS 50 cc/h DVT Prophylaxis: SCD  Code Status: Full Code Family Communication: none bedside  Disposition Plan: home when ready   Consultants:  None   Procedures:  None    Antibiotics  Anti-infectives    None       Studies  Ct Abdomen Pelvis Wo Contrast  05/02/2014   CLINICAL DATA:  81 year old female with epigastric abdominal pain since yesterday. Episode of emesis. History of breast cancer.  EXAM: CT ABDOMEN AND PELVIS WITHOUT CONTRAST  TECHNIQUE: Multidetector CT imaging of the abdomen and pelvis was performed following the standard protocol without IV contrast.  COMPARISON:  None.  FINDINGS: Linear atelectasis in the right and left lower lobe. The heart is enlarged. Coronary artery calcifications are seen.  Oral contrast within mildly distended stomach. No oral contrast has passed beyond the stomach. There dilated fluid-filled bowel loops in the central lower abdomen measuring up to 4.2 cm. Distal small bowel loops are decompressed. Dilated bowel loops approach fat containing anterior abdominal wall hernia, however there is no bowel involvement of the hernia. Multiple fat containing anterior abdominal wall hernias are seen. Dilated bowel loops are seen distal and proximal to the hernia sac. A discrete transition point is not seen. Cecum is high-riding in the mid abdomen. There scattered colonic diverticulosis throughout the entire colon without diverticulitis. Bowel evaluation in the pelvis is limited due to streak artifact from left hip  replacement.  Clips in the gallbladder fossa from cholecystectomy. No evidence of focal hepatic lesion allowing for noncontrast exam. The spleen is normal. There is mild thickening of the left adrenal gland without discrete adrenal nodule. The right adrenal gland is normal.  There are innumerable cysts within the left kidney at  varying sizes, the largest exophytic cyst posteriorly measures 7.1 x 6.0 cm. Anteriorly in the left upper pole is a 7 mm hyperdense lesion. There is a probable parapelvic cyst, the ureter is nondilated.  There is thinning of the right renal parenchyma. Within the lower right kidney is a 4.2 x 2.3 cm partially exophytic lesion measuring soft tissue density. Probable scarring in the upper right kidney. There is a 6 mm hyperdense lesion in the upper right kidney. Probable renovascular calcifications at both renal hila versus less likely nonobstructing stones.  Abdominal aorta is normal in caliber with dense atherosclerosis. There are bilateral renal artery stents in place. No retroperitoneal adenopathy.  Within the pelvis the urinary bladder is minimally distended. Large calcified lesion measures at least 8.4 cm is seen posterior to the rectum. This may reflect an exophytic uterine fibroid however is nonspecific. Associated soft tissue density in the presacral region. Detailed evaluation is limited by streak artifact from left hip prosthesis.  There is multilevel degenerative change throughout the spine. No acute or suspicious osseous abnormality.  IMPRESSION: 1. Probable partial small bowel obstruction with dilated small bowel loops in the central abdomen. A discrete transition point is not seen. 2. Multiple anterior abdominal wall hernias containing fat. There is no bowel involvement. 3. Multiple cysts within both kidneys. There are multiple hyperdense small subcentimeter lesions in both kidneys. Additionally in the lower right kidney is a 4.2 x 2.3 cm lesion measuring soft tissue density. These lesions are incompletely characterized without contrast. Further evaluation is recommended to exclude solid lesion, particularly the larger lesion in the lower right kidney. If patient is able to tolerate breath hold technique, and renal function permits, dedicated renal protocol MRI without and with contrast is recommended.  If this is not feasible due to patient age, initial evaluation could be considered with ultrasound. Comparison with any prior exams would be most helpful to evaluate for stability if available from an outside institution. 4. Large calcified lesion in the pelvis, may reflect pedunculated fibroid, however incompletely characterized given streak artifact from left hip prosthesis. 5. Incidental findings of relatively severe atherosclerosis, diverticulosis, and postcholecystectomy change.   Electronically Signed   By: Jeb Levering M.D.   On: 05/02/2014 23:38   Dg Abd Portable 1v  05/04/2014   CLINICAL DATA:  Small-bowel obstruction.  EXAM: PORTABLE ABDOMEN - 1 VIEW  COMPARISON:  03/23/ 2016.  CT 05/02/2014 .  FINDINGS: NG tube tip in the upper stomach in unchanged position. Surgical clips right upper quadrant. Distal repositioning should be considered. Nondilated air-filled loops of small bowel are noted. No evidence of colonic distention. Oral contrast noted throughout the colon. No free air. No pneumatosis. Lucencies noted over the right upper quadrant represent air within nondilated small bowel. Calcified fibroids. Peripheral vascular calcification. Left hip replacement.  IMPRESSION: 1. NG tube in stable position with its tip in the upper stomach. Slight distal repositioning may prove useful. 2. Nondilated air-filled loops of small bowel are noted. Colonic gas pattern is normal. Contrast is noted throughout the colon. Findings suggest resolution of small bowel obstruction. These results will be called to the ordering clinician or representative by the Radiologist Assistant, and communication documented in  the PACS or zVision Dashboard.   Electronically Signed   By: Marcello Moores  Register   On: 05/04/2014 09:08   Dg Abd Portable 1v  05/03/2014   CLINICAL DATA:  Followup small bowel obstruction.  EXAM: PORTABLE ABDOMEN - 1 VIEW  COMPARISON:  CT, 05/02/2014.  FINDINGS: No small bowel dilation is evident on the  current standard radiographs. Colon is normal in caliber containing some residual contrast as well as air.  Nasogastric tube passes below the diaphragm into the proximal stomach.  There are changes from prior cholecystectomy. Densely calcified large fibroids project in the pelvis.  Bones are demineralized. There are degenerative changes throughout the visualized spine. Left hip prosthesis appears well aligned on this single AP view.  IMPRESSION: 1. No residual small bowel dilation is seen. This is consistent with an improved/resolved small bowel obstruction.   Electronically Signed   By: Lajean Manes M.D.   On: 05/03/2014 11:10    Objective  Filed Vitals:   05/03/14 1356 05/03/14 2126 05/04/14 0725 05/04/14 0948  BP: 143/41  180/44 170/43  Pulse: 59 70 74 78  Temp: 98 F (36.7 C) 98.4 F (36.9 C) 98.9 F (37.2 C) 98.2 F (36.8 C)  TempSrc: Oral Oral Oral Oral  Resp: _0 Height:      Weight:      SpO2: 94% 93% 88% 92%    Intake/Output Summary (Last 24 hours) at 05/04/14 1048 Last data filed at 05/04/14 0800  Gross per 24 hour  Intake 1208.33 ml  Output    600 ml  Net 608.33 ml   Filed Weights   05/03/14 0149  Weight: 85.2 kg (187 lb 13.3 oz)   Exam:  General:  NAD  HEENT: no scleral icterus, PERRL  Cardiovascular: RRR without MRG, 2+ peripheral pulses, no edema  Respiratory: CTA biL, good air movement, no wheezing, no crackles, no rales  Abdomen: soft, BS +, no guarding, mild tenderness  MSK/Extremities: no clubbing/cyanosis, no joint swelling  Skin: no rashes  Neuro: non focal   Data Reviewed: Basic Metabolic Panel:  Recent Labs Lab 05/02/14 1921 05/03/14 0832  NA 140 145  K 3.2* 3.5  CL 105 110  CO2 24 23  GLUCOSE 110* 91  BUN 30* 24*  CREATININE 1.53* 1.30*  CALCIUM 8.6 8.2*   Liver Function Tests:  Recent Labs Lab 05/02/14 1921 05/03/14 0832  AST 17 16  ALT 11 9  ALKPHOS 70 66  BILITOT 0.6 0.6  PROT 6.2 5.4*  ALBUMIN 3.4* 2.9*     Recent Labs Lab 05/02/14 1921  LIPASE 34   CBC:  Recent Labs Lab 05/02/14 1921 05/03/14 1030  WBC 10.6* 10.2  NEUTROABS 8.7*  --   HGB 11.4* 10.5*  HCT 37.5 35.1*  MCV 84.5 85.6  PLT 329 259   Scheduled Meds: . allopurinol  100 mg Oral Daily  . amLODipine  10 mg Oral BID  . antiseptic oral rinse  7 mL Mouth Rinse q12n4p  . calcitRIOL  0.25 mcg Oral Daily  . chlorhexidine  15 mL Mouth Rinse BID  . cloNIDine  0.2 mg Oral BID  . colchicine  0.6 mg Oral Daily  . dorzolamide-timolol  1 drop Both Eyes QHS  . folic acid  1 mg Oral Daily  . furosemide  20 mg Oral Daily  . hydrALAZINE  50 mg Oral BID  . labetalol  200 mg Oral BID  . latanoprost  1 drop Both Eyes QHS  . levothyroxine  150 mcg Oral QAC breakfast  . multivitamin with minerals  1 tablet Oral Daily  . pantoprazole  40 mg Oral Daily  . tamoxifen  10 mg Oral Daily  . thiamine  100 mg Oral Daily   Continuous Infusions:    Marzetta Board, MD [facility] Pager [phone]. If 7 PM - 7 AM, please contact night-coverage at [email or web address], password [facility] 05/04/2014, 10:48 AM  LOS: 1 day

## 2014-05-04 NOTE — Telephone Encounter (Signed)
Patients daughter called in to reschedule her appointment due to being in the hospital

## 2014-05-05 DIAGNOSIS — E118 Type 2 diabetes mellitus with unspecified complications: Secondary | ICD-10-CM

## 2014-05-05 DIAGNOSIS — I5032 Chronic diastolic (congestive) heart failure: Secondary | ICD-10-CM

## 2014-05-05 LAB — BASIC METABOLIC PANEL
Anion gap: 8 (ref 5–15)
BUN: 24 mg/dL — AB (ref 6–23)
CALCIUM: 8 mg/dL — AB (ref 8.4–10.5)
CO2: 25 mmol/L (ref 19–32)
Chloride: 107 mmol/L (ref 96–112)
Creatinine, Ser: 1.39 mg/dL — ABNORMAL HIGH (ref 0.50–1.10)
GFR calc Af Amer: 37 mL/min — ABNORMAL LOW (ref >90)
GFR, EST NON AFRICAN AMERICAN: 32 mL/min — AB (ref >90)
Glucose, Bld: 106 mg/dL — ABNORMAL HIGH (ref 70–99)
Potassium: 3.4 mmol/L — ABNORMAL LOW (ref 3.5–5.1)
Sodium: 140 mmol/L (ref 135–145)

## 2014-05-05 MED ORDER — POTASSIUM CHLORIDE CRYS ER 20 MEQ PO TBCR
30.0000 meq | EXTENDED_RELEASE_TABLET | Freq: Once | ORAL | Status: AC
  Administered 2014-05-05: 30 meq via ORAL
  Filled 2014-05-05: qty 1

## 2014-05-05 NOTE — Discharge Summary (Signed)
Physician Discharge Summary  [place] Pink PPJ:[id number] DOB: May 28, 1919 DOA: 05/02/2014  PCP: Joycelyn Man, MD  Admit date: 05/02/2014 Discharge date: 05/05/2014  Time spent: >30 minutes  Recommendations for Outpatient Follow-up:  1. Follow with Dr. Sherren Mocha as scheduled in 2 weeks 2. Follow with Dr. Lindi Adie as scheduled in 3 weeks  Discharge Diagnoses:  Active Problems:   Diabetes mellitus   SBO (small bowel obstruction)   CKD (chronic kidney disease), stage III   Chronic diastolic heart failure   Breast cancer   DM (diabetes mellitus)  Discharge Condition: stable  Diet recommendation: heart healthy  Filed Weights   05/03/14 0149  Weight: 85.2 kg (187 lb 13.3 oz)   History of present illness:  Amanda Cunningham is a 81 y.o. female presents with abdominal pain. Patient has been having vomiting and abdominal pain which started around 6 PM yesterday,. She states that she was not able to keep anything down. She has noted diarrhea also. Patient states that she had associated gas also. She states that the pain is located in the right side. Does not appear to radiate to the back. She has no blood in the stools but she states that she is also taking iron. She states that she has had these symptoms at least 8 times over the years. She states that she gets these attacks 2-3 times per year and relates it to gall bladder surgery. Patient states that she has a lot of adhesions in her stomach. She has no fevers or chills. She has no headaches noted. She also is a diabetic and she states that she is well controlled  Hospital Course:  Partial SBO - patient was treated conservatively with NG tube placement, IVF and NPO status; she improved gradually and serial abdominal films showed resolution of her SBO. Her NG tube was discontinued 3/24 and her diet was gradually advanced to clears, full sand then soft diet. She tolerated this well without further abdominal pain, nausea or vomiting. She has had a  bowel movement prior to discharge. She will have close follow up with her PCP and Oncologist in few weeks.  Diabetes Mellitus - controlled, A1C 5.7 Diverticulosis - no evidence of inflammation presently Chronic diastolic heart failure - stable HTN -continue with her current medications CKD stage III-IV - at baseline currently Relapsed breast cancer invasive lobular cancer grade 2  - ER positive PR negative HER-2 Negative - outpatient oncology follow up with Dr. Lindi Adie Hypothyroidism - continue synthroid  Procedures:  None    Consultations:  None   Discharge Exam: Filed Vitals:   05/04/14 0948 05/04/14 1334 05/04/14 2018 05/05/14 0513  BP: 170/43 152/38 156/72 161/43  Pulse: 78 66 66 61  Temp: 98.2 F (36.8 C) 98.3 F (36.8 C) 98 F (36.7 C) 98.3 F (36.8 C)  TempSrc: Oral Oral Oral Oral  Resp: 18  20 18   Height:      Weight:      SpO2: 92% 94% 94% 94%   General: NAD Cardiovascular: RRR Respiratory: CTA biL  Discharge Instructions     Medication List    TAKE these medications        acetaminophen 500 MG tablet  Commonly known as:  TYLENOL  Take 1,000 mg by mouth every 6 (six) hours as needed for moderate pain or headache.     allopurinol 100 MG tablet  Commonly known as:  ZYLOPRIM  Take 100 mg by mouth daily.     amLODipine 10 MG tablet  Commonly known  as:  NORVASC  Take 10 mg by mouth 2 (two) times daily.     aspirin EC 81 MG tablet  Take 81 mg by mouth daily.     calcitRIOL 0.25 MCG capsule  Commonly known as:  ROCALTROL  Take 0.25 mcg by mouth daily.     cloNIDine 0.2 MG tablet  Commonly known as:  CATAPRES  Take 0.2 mg by mouth 2 (two) times daily.     clopidogrel 75 MG tablet  Commonly known as:  PLAVIX  Take 75 mg by mouth daily.     colchicine 0.6 MG tablet  Take 0.6 mg by mouth daily.     Dorzolamide HCl-Timolol Mal PF 22.3-6.8 MG/ML Soln  Apply 1 drop to eye at bedtime.     ferrous sulfate 325 (65 FE) MG tablet  Take 325 mg by  mouth daily with breakfast.     furosemide 20 MG tablet  Commonly known as:  LASIX  Take 20 mg by mouth daily.     glimepiride 2 MG tablet  Commonly known as:  AMARYL  Take 2 mg by mouth daily with breakfast.     glucose blood test strip  1 each by Other route daily. Use as instructed     hydrALAZINE 50 MG tablet  Commonly known as:  APRESOLINE  Take 50 mg by mouth 2 (two) times daily.     labetalol 200 MG tablet  Commonly known as:  NORMODYNE  Take 200 mg by mouth 2 (two) times daily.     levothyroxine 150 MCG tablet  Commonly known as:  SYNTHROID, LEVOTHROID  Take 150 mcg by mouth daily before breakfast.     Melatonin 1 MG Tabs  Take 1 tablet by mouth at bedtime as needed (sleep).     pantoprazole 40 MG tablet  Commonly known as:  PROTONIX  Take 40 mg by mouth daily.     potassium chloride SA 20 MEQ tablet  Commonly known as:  K-DUR,KLOR-CON  Take 20 mEq by mouth daily.     PRESERVISION/LUTEIN PO  Take 1 tablet by mouth 2 (two) times daily.     tamoxifen 10 MG tablet  Commonly known as:  NOLVADEX  Take 10 mg by mouth daily.     travoprost (benzalkonium) 0.004 % ophthalmic solution  Commonly known as:  TRAVATAN  Place 1 drop into both eyes at bedtime.         The results of significant diagnostics from this hospitalization (including imaging, microbiology, ancillary and laboratory) are listed below for reference.    Significant Diagnostic Studies: Ct Abdomen Pelvis Wo Contrast  05/02/2014   CLINICAL DATA:  81 year old female with epigastric abdominal pain since yesterday. Episode of emesis. History of breast cancer.  EXAM: CT ABDOMEN AND PELVIS WITHOUT CONTRAST  TECHNIQUE: Multidetector CT imaging of the abdomen and pelvis was performed following the standard protocol without IV contrast.  COMPARISON:  None.  FINDINGS: Linear atelectasis in the right and left lower lobe. The heart is enlarged. Coronary artery calcifications are seen.  Oral contrast within  mildly distended stomach. No oral contrast has passed beyond the stomach. There dilated fluid-filled bowel loops in the central lower abdomen measuring up to 4.2 cm. Distal small bowel loops are decompressed. Dilated bowel loops approach fat containing anterior abdominal wall hernia, however there is no bowel involvement of the hernia. Multiple fat containing anterior abdominal wall hernias are seen. Dilated bowel loops are seen distal and proximal to the hernia sac. A discrete transition  point is not seen. Cecum is high-riding in the mid abdomen. There scattered colonic diverticulosis throughout the entire colon without diverticulitis. Bowel evaluation in the pelvis is limited due to streak artifact from left hip replacement.  Clips in the gallbladder fossa from cholecystectomy. No evidence of focal hepatic lesion allowing for noncontrast exam. The spleen is normal. There is mild thickening of the left adrenal gland without discrete adrenal nodule. The right adrenal gland is normal.  There are innumerable cysts within the left kidney at varying sizes, the largest exophytic cyst posteriorly measures 7.1 x 6.0 cm. Anteriorly in the left upper pole is a 7 mm hyperdense lesion. There is a probable parapelvic cyst, the ureter is nondilated.  There is thinning of the right renal parenchyma. Within the lower right kidney is a 4.2 x 2.3 cm partially exophytic lesion measuring soft tissue density. Probable scarring in the upper right kidney. There is a 6 mm hyperdense lesion in the upper right kidney. Probable renovascular calcifications at both renal hila versus less likely nonobstructing stones.  Abdominal aorta is normal in caliber with dense atherosclerosis. There are bilateral renal artery stents in place. No retroperitoneal adenopathy.  Within the pelvis the urinary bladder is minimally distended. Large calcified lesion measures at least 8.4 cm is seen posterior to the rectum. This may reflect an exophytic uterine  fibroid however is nonspecific. Associated soft tissue density in the presacral region. Detailed evaluation is limited by streak artifact from left hip prosthesis.  There is multilevel degenerative change throughout the spine. No acute or suspicious osseous abnormality.  IMPRESSION: 1. Probable partial small bowel obstruction with dilated small bowel loops in the central abdomen. A discrete transition point is not seen. 2. Multiple anterior abdominal wall hernias containing fat. There is no bowel involvement. 3. Multiple cysts within both kidneys. There are multiple hyperdense small subcentimeter lesions in both kidneys. Additionally in the lower right kidney is a 4.2 x 2.3 cm lesion measuring soft tissue density. These lesions are incompletely characterized without contrast. Further evaluation is recommended to exclude solid lesion, particularly the larger lesion in the lower right kidney. If patient is able to tolerate breath hold technique, and renal function permits, dedicated renal protocol MRI without and with contrast is recommended. If this is not feasible due to patient age, initial evaluation could be considered with ultrasound. Comparison with any prior exams would be most helpful to evaluate for stability if available from an outside institution. 4. Large calcified lesion in the pelvis, may reflect pedunculated fibroid, however incompletely characterized given streak artifact from left hip prosthesis. 5. Incidental findings of relatively severe atherosclerosis, diverticulosis, and postcholecystectomy change.   Electronically Signed   By: Jeb Levering M.D.   On: 05/02/2014 23:38   Dg Abd Portable 1v  05/04/2014   CLINICAL DATA:  Small-bowel obstruction.  EXAM: PORTABLE ABDOMEN - 1 VIEW  COMPARISON:  03/23/ 2016.  CT 05/02/2014 .  FINDINGS: NG tube tip in the upper stomach in unchanged position. Surgical clips right upper quadrant. Distal repositioning should be considered. Nondilated air-filled  loops of small bowel are noted. No evidence of colonic distention. Oral contrast noted throughout the colon. No free air. No pneumatosis. Lucencies noted over the right upper quadrant represent air within nondilated small bowel. Calcified fibroids. Peripheral vascular calcification. Left hip replacement.  IMPRESSION: 1. NG tube in stable position with its tip in the upper stomach. Slight distal repositioning may prove useful. 2. Nondilated air-filled loops of small bowel are noted. Colonic gas  pattern is normal. Contrast is noted throughout the colon. Findings suggest resolution of small bowel obstruction. These results will be called to the ordering clinician or representative by the Radiologist Assistant, and communication documented in the PACS or zVision Dashboard.   Electronically Signed   By: Marcello Moores  Register   On: 05/04/2014 09:08   Dg Abd Portable 1v  05/03/2014   CLINICAL DATA:  Followup small bowel obstruction.  EXAM: PORTABLE ABDOMEN - 1 VIEW  COMPARISON:  CT, 05/02/2014.  FINDINGS: No small bowel dilation is evident on the current standard radiographs. Colon is normal in caliber containing some residual contrast as well as air.  Nasogastric tube passes below the diaphragm into the proximal stomach.  There are changes from prior cholecystectomy. Densely calcified large fibroids project in the pelvis.  Bones are demineralized. There are degenerative changes throughout the visualized spine. Left hip prosthesis appears well aligned on this single AP view.  IMPRESSION: 1. No residual small bowel dilation is seen. This is consistent with an improved/resolved small bowel obstruction.   Electronically Signed   By: Lajean Manes M.D.   On: 05/03/2014 11:10    Labs: Basic Metabolic Panel:  Recent Labs Lab 05/02/14 1921 05/03/14 0832 05/05/14 0550  NA 140 145 140  K 3.2* 3.5 3.4*  CL 105 110 107  CO2 24 23 25   GLUCOSE 110* 91 106*  BUN 30* 24* 24*  CREATININE 1.53* 1.30* 1.39*  CALCIUM 8.6 8.2*  8.0*   Liver Function Tests:  Recent Labs Lab 05/02/14 1921 05/03/14 0832  AST 17 16  ALT 11 9  ALKPHOS 70 66  BILITOT 0.6 0.6  PROT 6.2 5.4*  ALBUMIN 3.4* 2.9*    Recent Labs Lab 05/02/14 1921  LIPASE 34   CBC:  Recent Labs Lab 05/02/14 1921 05/03/14 1030  WBC 10.6* 10.2  NEUTROABS 8.7*  --   HGB 11.4* 10.5*  HCT 37.5 35.1*  MCV 84.5 85.6  PLT 329 259    Signed:  Alonso Gapinski  [facility] 05/05/2014, 12:51 PM

## 2014-05-05 NOTE — Progress Notes (Signed)
Amanda Cunningham to be D/C'd Home per MD order.  Discussed prescriptions and follow up appointments with the patient. Prescriptions given to patient, medication list explained in detail. Pt verbalized understanding.    Medication List    TAKE these medications        acetaminophen 500 MG tablet  Commonly known as:  TYLENOL  Take 1,000 mg by mouth every 6 (six) hours as needed for moderate pain or headache.     allopurinol 100 MG tablet  Commonly known as:  ZYLOPRIM  Take 100 mg by mouth daily.     amLODipine 10 MG tablet  Commonly known as:  NORVASC  Take 10 mg by mouth 2 (two) times daily.     aspirin EC 81 MG tablet  Take 81 mg by mouth daily.     calcitRIOL 0.25 MCG capsule  Commonly known as:  ROCALTROL  Take 0.25 mcg by mouth daily.     cloNIDine 0.2 MG tablet  Commonly known as:  CATAPRES  Take 0.2 mg by mouth 2 (two) times daily.     clopidogrel 75 MG tablet  Commonly known as:  PLAVIX  Take 75 mg by mouth daily.     colchicine 0.6 MG tablet  Take 0.6 mg by mouth daily.     Dorzolamide HCl-Timolol Mal PF 22.3-6.8 MG/ML Soln  Apply 1 drop to eye at bedtime.     ferrous sulfate 325 (65 FE) MG tablet  Take 325 mg by mouth daily with breakfast.     furosemide 20 MG tablet  Commonly known as:  LASIX  Take 20 mg by mouth daily.     glimepiride 2 MG tablet  Commonly known as:  AMARYL  Take 2 mg by mouth daily with breakfast.     glucose blood test strip  1 each by Other route daily. Use as instructed     hydrALAZINE 50 MG tablet  Commonly known as:  APRESOLINE  Take 50 mg by mouth 2 (two) times daily.     labetalol 200 MG tablet  Commonly known as:  NORMODYNE  Take 200 mg by mouth 2 (two) times daily.     levothyroxine 150 MCG tablet  Commonly known as:  SYNTHROID, LEVOTHROID  Take 150 mcg by mouth daily before breakfast.     Melatonin 1 MG Tabs  Take 1 tablet by mouth at bedtime as needed (sleep).     pantoprazole 40 MG tablet  Commonly known as:   PROTONIX  Take 40 mg by mouth daily.     potassium chloride SA 20 MEQ tablet  Commonly known as:  K-DUR,KLOR-CON  Take 20 mEq by mouth daily.     PRESERVISION/LUTEIN PO  Take 1 tablet by mouth 2 (two) times daily.     tamoxifen 10 MG tablet  Commonly known as:  NOLVADEX  Take 10 mg by mouth daily.     travoprost (benzalkonium) 0.004 % ophthalmic solution  Commonly known as:  TRAVATAN  Place 1 drop into both eyes at bedtime.        Filed Vitals:   05/05/14 0513  BP: 161/43  Pulse: 61  Temp: 98.3 F (36.8 C)  Resp: 18    Skin clean, dry and intact without evidence of skin break down, no evidence of skin tears noted. IV catheter discontinued intact. Site without signs and symptoms of complications. Dressing and pressure applied. Pt denies pain at this time. No complaints noted.  An After Visit Summary was printed and given to the  patient. Patient escorted via [place], and D/C home via private auto.  [name] 05/05/2014 12:55 PM

## 2014-05-10 ENCOUNTER — Encounter: Payer: Self-pay | Admitting: Family Medicine

## 2014-05-23 ENCOUNTER — Other Ambulatory Visit: Payer: Self-pay | Admitting: Family Medicine

## 2014-05-24 ENCOUNTER — Ambulatory Visit (HOSPITAL_BASED_OUTPATIENT_CLINIC_OR_DEPARTMENT_OTHER): Payer: Medicare Other | Admitting: Hematology and Oncology

## 2014-05-24 ENCOUNTER — Telehealth: Payer: Self-pay | Admitting: Hematology and Oncology

## 2014-05-24 VITALS — BP 149/55 | HR 69 | Temp 97.5°F | Resp 19 | Ht 61.0 in | Wt 183.3 lb

## 2014-05-24 DIAGNOSIS — C50812 Malignant neoplasm of overlapping sites of left female breast: Secondary | ICD-10-CM | POA: Diagnosis not present

## 2014-05-24 DIAGNOSIS — C50512 Malignant neoplasm of lower-outer quadrant of left female breast: Secondary | ICD-10-CM

## 2014-05-24 NOTE — Assessment & Plan Note (Signed)
Relapsed left breast invasive lobular cancer 0.6 cm ER 100% PR 0% HER-2/neu negative Ki-67 13% T1 B. N0 M0 stage IA; Currently on observation without treatment  Patient has decided that she would not want to go on any antiestrogen therapy because previously it had led to depression and cloudiness in the head which cleared out once she stopped the Arimidex. She would like to only get treated for breast cancer if it was causing her any symptoms. I believe that is completely reasonable.  We can watch and monitor her periodically and I recommended a follow-up back to see [place] in 6 months.

## 2014-05-24 NOTE — Telephone Encounter (Signed)
Left message to confirm appointment for October.Mailed calendar. °

## 2014-05-24 NOTE — Progress Notes (Signed)
Patient Care Team: Dorena Cookey, MD as PCP - General (Family Medicine) Dorena Cookey, MD (Family Medicine)  DIAGNOSIS: No matching staging information was found for the patient.  SUMMARY OF ONCOLOGIC HISTORY:   Breast cancer of lower-outer quadrant of left female breast   10/19/2013 Initial Diagnosis Left breast biopsy: Invasive lobular cancer, ER 100%, PR 0%, Ki-67 13%, currently on observation without treatment by patient preference   05/02/2014 - 05/05/2014 Hospital Admission Hospitalization for partial small bowel obstruction treated conservatively    CHIEF COMPLIANT: Follow-up of breast cancer  INTERVAL HISTORY: Amanda Cunningham is a 81 year old with above-mentioned history of left breast cancer currently on observation without any treatment. She was recently hospitalized for partial small bowel obstruction. She improved from this obstruction. She reports that she gets these episodes every so often. Denies any pain or discomfort in the breast.  REVIEW OF SYSTEMS:   Constitutional: Denies fevers, chills or abnormal weight loss Eyes: Denies blurriness of vision Ears, nose, mouth, throat, and face: Denies mucositis or sore throat Respiratory: Denies cough, dyspnea or wheezes Cardiovascular: Denies palpitation, chest discomfort or lower extremity swelling Gastrointestinal:  Denies nausea, heartburn or change in bowel habits Skin: Denies abnormal skin rashes Lymphatics: Denies new lymphadenopathy or easy bruising Neurological:Denies numbness, tingling or new weaknesses Behavioral/Psych: Mood is stable, no new changes  Breast:  Denies any pain in the breast All other systems were reviewed with the patient and are negative.  I have reviewed the past medical history, past surgical history, social history and family history with the patient and they are unchanged from previous note.  ALLERGIES:  is allergic to iodine; ciprofloxacin; codeine; iodine; sodium sulfite; and tape.  MEDICATIONS:   Current Outpatient Prescriptions  Medication Sig Dispense Refill  . acetaminophen (TYLENOL) 500 MG tablet Take 1,000 mg by mouth every 6 (six) hours as needed for moderate pain or headache.    . allopurinol (ZYLOPRIM) 100 MG tablet Take 1 tablet (100 mg total) by mouth daily. 90 tablet 3  . allopurinol (ZYLOPRIM) 100 MG tablet Take 100 mg by mouth daily.    Marland Kitchen amLODipine (NORVASC) 10 MG tablet Take 1 tablet (10 mg total) by mouth 2 (two) times daily. 31 tablet 3  . amLODipine (NORVASC) 10 MG tablet Take 10 mg by mouth 2 (two) times daily.    Marland Kitchen aspirin 81 MG tablet Take 1 tablet (81 mg total) by mouth daily. 30 tablet 1  . aspirin EC 81 MG tablet Take 81 mg by mouth daily.    . calcitRIOL (ROCALTROL) 0.25 MCG capsule     . calcitRIOL (ROCALTROL) 0.25 MCG capsule Take 0.25 mcg by mouth daily.    . cloNIDine (CATAPRES) 0.2 MG tablet Take 1 tablet (0.2 mg total) by mouth 2 (two) times daily. (Patient taking differently: Take 0.2 mg by mouth. Half tab daily) 62 tablet 3  . cloNIDine (CATAPRES) 0.2 MG tablet Take 0.2 mg by mouth 2 (two) times daily.    . clopidogrel (PLAVIX) 75 MG tablet TAKE 1 TABLET BY MOUTH DAILY 90 tablet 3  . clopidogrel (PLAVIX) 75 MG tablet Take 75 mg by mouth daily.    . colchicine 0.6 MG tablet Take 0.6 mg by mouth daily.    . colchicine 0.6 MG tablet Take 0.6 mg by mouth daily.    . Dorzolamide HCl-Timolol Mal PF 22.3-6.8 MG/ML SOLN Apply 1 drop to eye at bedtime.    . dorzolamide-timolol (COSOPT) 22.3-6.8 MG/ML ophthalmic solution Place 1 drop into both eyes  at bedtime.    . ferrous sulfate 325 (65 FE) MG tablet Take 325 mg by mouth daily with breakfast.    . ferrous sulfate 325 (65 FE) MG tablet Take 325 mg by mouth daily with breakfast.    . furosemide (LASIX) 20 MG tablet Take 1 tablet (20 mg total) by mouth daily. 30 tablet 3  . furosemide (LASIX) 20 MG tablet Take 20 mg by mouth daily.    Marland Kitchen glimepiride (AMARYL) 2 MG tablet Take 1 mg by mouth daily with breakfast.      . glimepiride (AMARYL) 2 MG tablet Take 2 mg by mouth daily with breakfast.    . glucose blood test strip 1 each by Other route daily. Use as instructed    . hydrALAZINE (APRESOLINE) 100 MG tablet Take 1 tablet (100 mg total) by mouth 2 (two) times daily. 90 tablet 3  . hydrALAZINE (APRESOLINE) 50 MG tablet TAKE 1 TABLET BY MOUTH TWICE A DAY 60 tablet 4  . hydrALAZINE (APRESOLINE) 50 MG tablet Take 50 mg by mouth 2 (two) times daily.    Marland Kitchen KLOR-CON M20 20 MEQ tablet Take 20 mEq by mouth once.     . labetalol (NORMODYNE) 200 MG tablet TAKE 1 TABLET BY MOUTH TWICE A DAY 60 tablet 7  . Lancets (ONETOUCH ULTRASOFT) lancets TEST ONCE DAILY AS DIRECTED 100 each 3  . levothyroxine (SYNTHROID, LEVOTHROID) 150 MCG tablet Take 150 mcg by mouth daily before breakfast.     . levothyroxine (SYNTHROID, LEVOTHROID) 150 MCG tablet Take 150 mcg by mouth daily before breakfast.    . Melatonin 1 MG TABS Take 1 mg by mouth at bedtime as needed (sleep).     . Melatonin 1 MG TABS Take 1 tablet by mouth at bedtime as needed (sleep).    . Multiple Vitamins-Minerals (PRESERVISION/LUTEIN PO) Take 1 tablet by mouth 2 (two) times daily.    . Multiple Vitamins-Minerals (PRESERVISION/LUTEIN) CAPS Take 1 capsule by mouth 2 (two) times daily.     . ONE TOUCH ULTRA TEST test strip TEST ONCE DAILY 100 each 5  . pantoprazole (PROTONIX) 40 MG tablet Take 40 mg by mouth daily.      . pantoprazole (PROTONIX) 40 MG tablet Take 40 mg by mouth daily.    . potassium chloride SA (K-DUR,KLOR-CON) 20 MEQ tablet Take 20 mEq by mouth daily.    . tamoxifen (NOLVADEX) 10 MG tablet Take 1 tablet (10 mg total) by mouth daily. 90 tablet 3  . tamoxifen (NOLVADEX) 10 MG tablet Take 10 mg by mouth daily.    . travoprost, benzalkonium, (TRAVATAN) 0.004 % ophthalmic solution Place 1 drop into both eyes at bedtime.     . travoprost, benzalkonium, (TRAVATAN) 0.004 % ophthalmic solution Place 1 drop into both eyes at bedtime.     No current  facility-administered medications for this visit.    PHYSICAL EXAMINATION: ECOG PERFORMANCE STATUS: 1 - Symptomatic but completely ambulatory  Filed Vitals:   05/24/14 0942  BP: 149/55  Pulse: 69  Temp: 97.5 F (36.4 C)  Resp: 19   Filed Weights   05/24/14 0942  Weight: 183 lb 4.8 oz (83.144 kg)    GENERAL:alert, no distress and comfortable SKIN: skin color, texture, turgor are normal, no rashes or significant lesions EYES: normal, Conjunctiva are pink and non-injected, sclera clear OROPHARYNX:no exudate, no erythema and lips, buccal mucosa, and tongue normal  NECK: supple, thyroid normal size, non-tender, without nodularity LYMPH:  no palpable lymphadenopathy in the cervical, axillary  or inguinal LUNGS: clear to auscultation and percussion with normal breathing effort HEART: regular rate & rhythm and no murmurs and no lower extremity edema ABDOMEN:abdomen soft, non-tender and normal bowel sounds Musculoskeletal:no cyanosis of digits and no clubbing  NEURO: alert & oriented x 3 with fluent speech, no focal motor/sensory deficits BREAST: I could not palpate her breast cancer.. No palpable axillary supraclavicular or infraclavicular adenopathy no breast tenderness or nipple discharge. (exam performed in the presence of a chaperone)  LABORATORY DATA:  I have reviewed the data as listed   Chemistry      Component Value Date/Time   NA 140 05/05/2014 0550   K 3.4* 05/05/2014 0550   CL 107 05/05/2014 0550   CO2 25 05/05/2014 0550   BUN 24* 05/05/2014 0550   CREATININE 1.39* 05/05/2014 0550   CREATININE 1.49* 12/21/2012 1139      Component Value Date/Time   CALCIUM 8.0* 05/05/2014 0550   ALKPHOS 66 05/03/2014 0832   AST 16 05/03/2014 0832   ALT 9 05/03/2014 0832   BILITOT 0.6 05/03/2014 0832       Lab Results  Component Value Date   WBC 10.2 05/03/2014   HGB 10.5* 05/03/2014   HCT 35.1* 05/03/2014   MCV 85.6 05/03/2014   PLT 259 05/03/2014   NEUTROABS 8.7*  05/02/2014   ASSESSMENT & PLAN:  Breast cancer of lower-outer quadrant of left female breast Relapsed left breast invasive lobular cancer 0.6 cm ER 100% PR 0% HER-2/neu negative Ki-67 13% T1 B. N0 M0 stage IA; Currently on observation without treatment  Patient has decided that she would not want to go on any antiestrogen therapy because previously it had led to depression and cloudiness in the head which cleared out once she stopped the Arimidex. She would like to only get treated for breast cancer if it was causing her any symptoms. I believe that is completely reasonable.  We can watch and monitor her periodically and I recommended a follow-up back to see [place] in 6 months. We decided not to do any further imaging investigations and we will clinically watch and monitor her.  No orders of the defined types were placed in this encounter.   The patient has a good understanding of the overall plan. she agrees with it. She will call with any problems that may develop before her next visit here.   Rulon Eisenmenger, MD

## 2014-05-29 ENCOUNTER — Ambulatory Visit: Payer: Medicare Other | Admitting: Family Medicine

## 2014-06-01 ENCOUNTER — Ambulatory Visit (INDEPENDENT_AMBULATORY_CARE_PROVIDER_SITE_OTHER): Payer: Medicare Other | Admitting: Family Medicine

## 2014-06-01 ENCOUNTER — Encounter: Payer: Self-pay | Admitting: Family Medicine

## 2014-06-01 VITALS — HR 79 | Temp 97.6°F | Wt 187.1 lb

## 2014-06-01 DIAGNOSIS — I5032 Chronic diastolic (congestive) heart failure: Secondary | ICD-10-CM | POA: Diagnosis not present

## 2014-06-01 NOTE — Patient Instructions (Signed)
Lasix 20 mg............. take one extra tablet today then starting tomorrow resume your normal dose of 1 daily  Daily weights at home  Return in one month for follow-up sooner if any problems  Rachel's extension is [id number].

## 2014-06-01 NOTE — Progress Notes (Signed)
   Subjective:    Patient ID: Amanda Cunningham, female    DOB: 09-06-1919, 81 y.o.   MRN: [id number]  HPI Amanda Cunningham is is 81 year old married female nonsmoker who comes in today accompanied by her husband for evaluation of diastolic heart failure.  She was admitted March 23 discharged March 25 for chronic diastolic heart failure. At that time she presented with abdominal pain. She also had some vomiting. Diagnostic study showed a small bowel obstruction.  She was treated conservatively with NG suction and IV fluids and nothing by mouth. Symptoms resolved. She should today for follow-up.  Her weight has gone up 4 pounds in the past 2 weeks. She doesn't feel any more short of breath than normal. Pulse ox on room air 98% pulse 70 and regular BP 172/60  Blood pressure at home 140/90   Review of Systems Review of systems otherwise negative    Objective:   Physical Exam Well-developed well-nourished female no acute distress vital signs stable for her  Cardiopulmonary exam normal except first crackles bilateral from her chronic diastolic dysfunction       Assessment & Plan:  Chronic heart failure....... extra dose of Lasix 1 today  Resume all previous meds  Follow-up in one month  Partial small bowel obstruction.......Marland Kitchen resolve with conservative therapy.

## 2014-06-01 NOTE — Progress Notes (Signed)
Pre visit review using our clinic review tool, if applicable. No additional management support is needed unless otherwise documented below in the visit note. 

## 2014-06-13 DIAGNOSIS — N184 Chronic kidney disease, stage 4 (severe): Secondary | ICD-10-CM | POA: Diagnosis not present

## 2014-06-13 DIAGNOSIS — E1122 Type 2 diabetes mellitus with diabetic chronic kidney disease: Secondary | ICD-10-CM | POA: Diagnosis not present

## 2014-06-13 DIAGNOSIS — I129 Hypertensive chronic kidney disease with stage 1 through stage 4 chronic kidney disease, or unspecified chronic kidney disease: Secondary | ICD-10-CM | POA: Diagnosis not present

## 2014-06-13 DIAGNOSIS — N183 Chronic kidney disease, stage 3 (moderate): Secondary | ICD-10-CM | POA: Diagnosis not present

## 2014-06-13 DIAGNOSIS — M109 Gout, unspecified: Secondary | ICD-10-CM | POA: Diagnosis not present

## 2014-06-13 DIAGNOSIS — D631 Anemia in chronic kidney disease: Secondary | ICD-10-CM | POA: Diagnosis not present

## 2014-07-06 ENCOUNTER — Ambulatory Visit (INDEPENDENT_AMBULATORY_CARE_PROVIDER_SITE_OTHER): Payer: Medicare Other | Admitting: Family Medicine

## 2014-07-06 ENCOUNTER — Encounter: Payer: Self-pay | Admitting: Family Medicine

## 2014-07-06 VITALS — BP 120/70 | Temp 97.5°F | Wt 190.5 lb

## 2014-07-06 DIAGNOSIS — N189 Chronic kidney disease, unspecified: Secondary | ICD-10-CM | POA: Diagnosis not present

## 2014-07-06 DIAGNOSIS — N183 Chronic kidney disease, stage 3 unspecified: Secondary | ICD-10-CM

## 2014-07-06 DIAGNOSIS — I5032 Chronic diastolic (congestive) heart failure: Secondary | ICD-10-CM | POA: Diagnosis not present

## 2014-07-06 NOTE — Progress Notes (Signed)
Pre visit review using our clinic review tool, if applicable. No additional management support is needed unless otherwise documented below in the visit note. 

## 2014-07-06 NOTE — Progress Notes (Signed)
   Subjective:    Patient ID: Amanda Cunningham, female    DOB: [birth year], 81 y.o.   MRN: [id number]  HPI Amanda Cunningham is a 81 year old female married nonsmoker who comes in today company by her husband for evaluation of congestive heart failure  Her weight is up 3 pounds but she had a 31 year when he had a virtually party a week or so ago. She says she feels well no change in her breathing.  Med list reviewed is been no changes  She'll he complains some pain in the base of her left foot. She says her something stuck there.   Review of Systems Review of systems otherwise negative    Objective:   Physical Exam  Well-developed well-nourished elderly female no acute distress vital signs stable she's afebrile  Cardiopulmonary exam normal no crackles  Examination left foot shows a corn. After an alcohol prep the corners removed      Assessment & Plan:  Congestive heart failure stable..... Continue current therapy follow-up in one month  Corn left foot........Marland Kitchen removed with a 15 scalpel blade

## 2014-07-06 NOTE — Patient Instructions (Signed)
Continue current medications  Remember you can double up on her Lasix if you feel short of breath or you gain more than 2 pounds in 1 day  Follow-up in one month with [employer].......... he's on new adult nurse practitioner from [facility]

## 2014-07-17 ENCOUNTER — Other Ambulatory Visit: Payer: Self-pay | Admitting: Family Medicine

## 2014-07-27 DIAGNOSIS — E1151 Type 2 diabetes mellitus with diabetic peripheral angiopathy without gangrene: Secondary | ICD-10-CM | POA: Diagnosis not present

## 2014-07-27 DIAGNOSIS — I739 Peripheral vascular disease, unspecified: Secondary | ICD-10-CM | POA: Diagnosis not present

## 2014-07-27 DIAGNOSIS — L84 Corns and callosities: Secondary | ICD-10-CM | POA: Diagnosis not present

## 2014-07-27 DIAGNOSIS — L603 Nail dystrophy: Secondary | ICD-10-CM | POA: Diagnosis not present

## 2014-08-07 ENCOUNTER — Encounter: Payer: Self-pay | Admitting: Adult Health

## 2014-08-07 ENCOUNTER — Ambulatory Visit (INDEPENDENT_AMBULATORY_CARE_PROVIDER_SITE_OTHER): Payer: Medicare Other | Admitting: Adult Health

## 2014-08-07 VITALS — BP 130/60 | Temp 97.9°F | Ht 61.0 in | Wt 191.3 lb

## 2014-08-07 DIAGNOSIS — E876 Hypokalemia: Secondary | ICD-10-CM | POA: Diagnosis not present

## 2014-08-07 DIAGNOSIS — I5032 Chronic diastolic (congestive) heart failure: Secondary | ICD-10-CM

## 2014-08-07 LAB — BASIC METABOLIC PANEL
BUN: 27 mg/dL — ABNORMAL HIGH (ref 6–23)
CO2: 26 meq/L (ref 19–32)
CREATININE: 1.51 mg/dL — AB (ref 0.40–1.20)
Calcium: 8.9 mg/dL (ref 8.4–10.5)
Chloride: 108 mEq/L (ref 96–112)
GFR: 34.17 mL/min — ABNORMAL LOW (ref >60.00)
Glucose, Bld: 119 mg/dL — ABNORMAL HIGH (ref 70–99)
Potassium: 3.7 mEq/L (ref 3.5–5.1)
Sodium: 142 mEq/L (ref 135–145)

## 2014-08-07 NOTE — Patient Instructions (Signed)
It was great meeting you today! I will follow up with you regarding your blood work.   Please continue to monitor your weights and blood pressure. If you are needing to take double your Lasix dose more than twice a week, please let us know.

## 2014-08-07 NOTE — Progress Notes (Signed)
Subjective:    Patient ID: Amanda Cunningham, female    DOB: [birth year], 81 y.o.   MRN: [id number]  Moria Brophy Modieis a 81 y.o. female presenting with at Follow-up  on 08/07/2014   HPI  Patient seen as follow up regarding her Lasix uses. She states that her weight has been stable and she has not had to double up on her lasix. She denies any CP and SOB on exertion ( chronic) and is eating and drinking ok. She only took a double dose once. Her home weight has been 186 every morning.   Wt Readings from Last 3 Encounters:  08/07/14 191 lb 4.8 oz (86.773 kg)  07/06/14 190 lb 8 oz (86.41 kg)  06/01/14 187 lb 1.6 oz (84.868 kg)     Past Medical History  Diagnosis Date  . Anemia   . Glaucoma   . Diabetes mellitus   . Hypertension   . Thyroid disease   . Arthritis   . Cancer     breast  . Chronic kidney disease   . PAD (peripheral artery disease)   . Neuropathy   . Diabetes mellitus without complication   . Cancer   . Breast CA     History   Social History  . Marital Status: Married    Spouse Name: N/A  . Number of Children: N/A  . Years of Education: N/A   Occupational History  . Not on file.   Social History Main Topics  . Smoking status: Never Smoker   . Smokeless tobacco: Not on file  . Alcohol Use: No  . Drug Use: No  . Sexual Activity: Not on file   Other Topics Concern  . Not on file   Social History Narrative   ** Merged History Encounter **        Past Surgical History  Procedure Laterality Date  . Total hip arthroplasty      left  . Replacement total knee      right  . Eye surgery    . Cholecystectomy    . Breast lumpectomy      left  . Ureteral stent placement      Family History  Problem Relation Age of Onset  . Heart disease Mother   . Coronary artery disease Father   . Cancer Brother     prostate cancer    Allergies  Allergen Reactions  . Iodine Hives and Shortness Of Breath  . Ciprofloxacin Other (See Comments)  . Codeine  Other (See Comments)  . Iodine Hives    Shock  . Sodium Sulfite   . Tape Rash    Plastic tape    Current Outpatient Prescriptions on File Prior to Visit  Medication Sig Dispense Refill  . acetaminophen (TYLENOL) 500 MG tablet Take 1,000 mg by mouth every 6 (six) hours as needed for moderate pain or headache.    . allopurinol (ZYLOPRIM) 100 MG tablet Take 1 tablet (100 mg total) by mouth daily. (Patient taking differently: Take 100 mg by mouth 2 (two) times daily. ) 90 tablet 3  . amLODipine (NORVASC) 10 MG tablet Take 1 tablet (10 mg total) by mouth 2 (two) times daily. 31 tablet 3  . aspirin EC 81 MG tablet Take 81 mg by mouth daily.    . calcitRIOL (ROCALTROL) 0.25 MCG capsule     . cloNIDine (CATAPRES) 0.2 MG tablet 0.2 mg. Half tab twice daily    . clopidogrel (PLAVIX) 75 MG tablet TAKE 1  TABLET BY MOUTH DAILY 90 tablet 3  . colchicine 0.6 MG tablet Take 0.6 mg by mouth daily.    . Dorzolamide HCl-Timolol Mal PF 22.3-6.8 MG/ML SOLN Apply 1 drop to eye at bedtime.    . dorzolamide-timolol (COSOPT) 22.3-6.8 MG/ML ophthalmic solution Place 1 drop into both eyes at bedtime.    . ferrous sulfate 325 (65 FE) MG tablet Take 325 mg by mouth daily with breakfast.    . furosemide (LASIX) 20 MG tablet Take 1 tablet (20 mg total) by mouth daily. 30 tablet 3  . furosemide (LASIX) 20 MG tablet Take 20 mg by mouth daily.    Marland Kitchen glimepiride (AMARYL) 2 MG tablet TAKE 1/2 TABLET BY MOUTH DAILY. MAY TAKE 1 TABLET IF BLOOD SUGAR IS HIGH 30 tablet 5  . glucose blood test strip 1 each by Other route daily. Use as instructed    . hydrALAZINE (APRESOLINE) 100 MG tablet Take 1 tablet (100 mg total) by mouth 2 (two) times daily. 90 tablet 3  . hydrALAZINE (APRESOLINE) 50 MG tablet Take 50 mg by mouth 2 (two) times daily.    Marland Kitchen KLOR-CON M20 20 MEQ tablet Take 20 mEq by mouth once.     . Lancets (ONETOUCH ULTRASOFT) lancets TEST ONCE DAILY AS DIRECTED 100 each 3  . levothyroxine (SYNTHROID, LEVOTHROID) 150 MCG  tablet Take 150 mcg by mouth daily before breakfast.     . Melatonin 1 MG TABS Take 1 mg by mouth at bedtime as needed (sleep).     . Melatonin 1 MG TABS Take 1 tablet by mouth at bedtime as needed (sleep).    . Multiple Vitamins-Minerals (PRESERVISION/LUTEIN PO) Take 1 tablet by mouth 2 (two) times daily.    . ONE TOUCH ULTRA TEST test strip TEST ONCE DAILY 100 each 5  . pantoprazole (PROTONIX) 40 MG tablet Take 40 mg by mouth daily.      . potassium chloride SA (K-DUR,KLOR-CON) 20 MEQ tablet Take 20 mEq by mouth daily.    . travoprost, benzalkonium, (TRAVATAN) 0.004 % ophthalmic solution Place 1 drop into both eyes at bedtime.      No current facility-administered medications on file prior to visit.    BP 130/60 mmHg  Temp(Src) 97.9 F (36.6 C) (Oral)  Ht 5\' 1"  (1.549 m)  Wt 191 lb 4.8 oz (86.773 kg)  BMI 36.16 kg/m2    Review of Systems  Constitutional: Negative.   Respiratory: Positive for shortness of breath.   Cardiovascular: Negative.   Gastrointestinal: Negative.   Genitourinary: Negative.        Objective:   Physical Exam  Constitutional: She is oriented to person, place, and time. She appears well-developed and well-nourished. No distress.  Cardiovascular: Normal rate, regular rhythm, normal heart sounds and intact distal pulses.  Exam reveals no gallop and no friction rub.   No murmur heard. Pulmonary/Chest: Effort normal and breath sounds normal. No respiratory distress. She has no wheezes. She has no rales. She exhibits no tenderness.  Neurological: She is alert and oriented to person, place, and time.  Skin: Skin is warm and dry. No rash noted. She is not diaphoretic. No erythema. No pallor.  Psychiatric: She has a normal mood and affect. Her behavior is normal. Judgment and thought content normal.  Nursing note and vitals reviewed.     Assessment & Plan:   1. Chronic diastolic heart failure - Continue to monitor weight. If greater than three pounds then  doubel up on lasix and inform this  provider - Follow up as need and follow up with Cardiology  2. Hypokalemia - Basic metabolic panel

## 2014-08-08 ENCOUNTER — Other Ambulatory Visit: Payer: Self-pay | Admitting: Family Medicine

## 2014-08-29 DIAGNOSIS — E11329 Type 2 diabetes mellitus with mild nonproliferative diabetic retinopathy without macular edema: Secondary | ICD-10-CM | POA: Diagnosis not present

## 2014-08-29 DIAGNOSIS — H31012 Macula scars of posterior pole (postinflammatory) (post-traumatic), left eye: Secondary | ICD-10-CM | POA: Diagnosis not present

## 2014-08-29 DIAGNOSIS — H1859 Other hereditary corneal dystrophies: Secondary | ICD-10-CM | POA: Diagnosis not present

## 2014-08-29 DIAGNOSIS — H4011X0 Primary open-angle glaucoma, stage unspecified: Secondary | ICD-10-CM | POA: Diagnosis not present

## 2014-08-29 DIAGNOSIS — H3532 Exudative age-related macular degeneration: Secondary | ICD-10-CM | POA: Diagnosis not present

## 2014-08-29 DIAGNOSIS — Z961 Presence of intraocular lens: Secondary | ICD-10-CM | POA: Diagnosis not present

## 2014-09-06 ENCOUNTER — Other Ambulatory Visit: Payer: Self-pay | Admitting: Family Medicine

## 2014-09-16 ENCOUNTER — Other Ambulatory Visit: Payer: Self-pay | Admitting: Family Medicine

## 2014-09-26 ENCOUNTER — Ambulatory Visit (INDEPENDENT_AMBULATORY_CARE_PROVIDER_SITE_OTHER): Payer: Medicare Other | Admitting: Internal Medicine

## 2014-09-26 ENCOUNTER — Encounter: Payer: Self-pay | Admitting: Internal Medicine

## 2014-09-26 VITALS — BP 140/70 | HR 68 | Temp 97.8°F | Resp 20 | Ht 61.0 in | Wt 191.0 lb

## 2014-09-26 DIAGNOSIS — E079 Disorder of thyroid, unspecified: Secondary | ICD-10-CM

## 2014-09-26 DIAGNOSIS — D638 Anemia in other chronic diseases classified elsewhere: Secondary | ICD-10-CM | POA: Diagnosis not present

## 2014-09-26 DIAGNOSIS — D72829 Elevated white blood cell count, unspecified: Secondary | ICD-10-CM | POA: Diagnosis not present

## 2014-09-26 DIAGNOSIS — N183 Chronic kidney disease, stage 3 unspecified: Secondary | ICD-10-CM

## 2014-09-26 DIAGNOSIS — E0822 Diabetes mellitus due to underlying condition with diabetic chronic kidney disease: Secondary | ICD-10-CM

## 2014-09-26 LAB — COMPREHENSIVE METABOLIC PANEL
ALBUMIN: 3.6 g/dL (ref 3.5–5.2)
ALK PHOS: 80 U/L (ref 39–117)
ALT: 8 U/L (ref 0–35)
AST: 14 U/L (ref 0–37)
BILIRUBIN TOTAL: 0.4 mg/dL (ref 0.2–1.2)
BUN: 30 mg/dL — ABNORMAL HIGH (ref 6–23)
CALCIUM: 9.1 mg/dL (ref 8.4–10.5)
CO2: 28 mEq/L (ref 19–32)
Chloride: 105 mEq/L (ref 96–112)
Creatinine, Ser: 1.59 mg/dL — ABNORMAL HIGH (ref 0.40–1.20)
GFR: 32.19 mL/min — AB (ref >60.00)
Glucose, Bld: 108 mg/dL — ABNORMAL HIGH (ref 70–99)
Potassium: 3.7 mEq/L (ref 3.5–5.1)
Sodium: 141 mEq/L (ref 135–145)
TOTAL PROTEIN: 6.4 g/dL (ref 6.0–8.3)

## 2014-09-26 LAB — CBC WITH DIFFERENTIAL/PLATELET
BASOS ABS: 0 10*3/uL (ref 0.0–0.1)
Basophils Relative: 0.3 % (ref 0.0–3.0)
Eosinophils Absolute: 0.5 10*3/uL (ref 0.0–0.7)
Eosinophils Relative: 6.4 % — ABNORMAL HIGH (ref 0.0–5.0)
HEMATOCRIT: 32.4 % — AB (ref 36.0–46.0)
Hemoglobin: 10.6 g/dL — ABNORMAL LOW (ref 12.0–15.0)
LYMPHS PCT: 10.5 % — AB (ref 12.0–46.0)
Lymphs Abs: 0.9 10*3/uL (ref 0.7–4.0)
MCHC: 32.8 g/dL (ref 30.0–36.0)
MCV: 83.4 fl (ref 78.0–100.0)
MONOS PCT: 6.3 % (ref 3.0–12.0)
Monocytes Absolute: 0.5 10*3/uL (ref 0.1–1.0)
Neutro Abs: 6.4 10*3/uL (ref 1.4–7.7)
Neutrophils Relative %: 76.5 % (ref 43.0–77.0)
Platelets: 303 10*3/uL (ref 150.0–400.0)
RBC: 3.88 Mil/uL (ref 3.87–5.11)
RDW: 17.5 % — ABNORMAL HIGH (ref 11.5–15.5)
WBC: 8.4 10*3/uL (ref 4.0–10.5)

## 2014-09-26 LAB — TSH: TSH: 5.21 u[IU]/mL — ABNORMAL HIGH (ref 0.35–4.50)

## 2014-09-26 LAB — HEMOGLOBIN A1C: Hgb A1c MFr Bld: 5.5 % (ref 4.6–6.5)

## 2014-09-26 NOTE — Patient Instructions (Signed)
Limit your sodium (Salt) intake  Please check your blood pressure on a regular basis.  If it is consistently greater than 150/90, please make an office appointment.  Report any new or worsening symptoms

## 2014-09-26 NOTE — Progress Notes (Signed)
Subjective:    Patient ID: Amanda Cunningham, female    DOB: Sep 13, 1919, 81 y.o.   MRN: [id number]  HPI  Admit date: 05/02/2014 Discharge date: 05/05/2014  Recommendations for Outpatient Follow-up:  1. Follow with Dr. Sherren Mocha as scheduled in 2 weeks 2. Follow with Dr. Lindi Adie as scheduled in 3 weeks  Discharge Diagnoses:  Active Problems:  Diabetes mellitus  SBO (small bowel obstruction)  CKD (chronic kidney disease), stage III  Chronic diastolic heart failure  Breast cancer  DM (diabetes mellitus)  81 year old patient who is seen today in follow-up.  Her chief complaint is insomnia.  She has been sleeping poorly for the past 3-4 months.  She does require a trip to the restroom at least once during the night and often has a difficult time falling asleep.  She does use melatonin prior to sleep, which has been helpful.  She has had some secondary fatigue that she feels is related to the insomnia.  Today she had some mild nausea.  Past Medical History  Diagnosis Date  . Anemia   . Glaucoma   . Diabetes mellitus   . Hypertension   . Thyroid disease   . Arthritis   . Cancer     breast  . Chronic kidney disease   . PAD (peripheral artery disease)   . Neuropathy   . Diabetes mellitus without complication   . Cancer   . Breast CA     Social History   Social History  . Marital Status: Married    Spouse Name: N/A  . Number of Children: N/A  . Years of Education: N/A   Occupational History  . Not on file.   Social History Main Topics  . Smoking status: Never Smoker   . Smokeless tobacco: Not on file  . Alcohol Use: No  . Drug Use: No  . Sexual Activity: Not on file   Other Topics Concern  . Not on file   Social History Narrative   ** Merged History Encounter **        Past Surgical History  Procedure Laterality Date  . Total hip arthroplasty      left  . Replacement total knee      right  . Eye surgery    . Cholecystectomy    . Breast lumpectomy     left  . Ureteral stent placement      Family History  Problem Relation Age of Onset  . Heart disease Mother   . Coronary artery disease Father   . Cancer Brother     prostate cancer    Allergies  Allergen Reactions  . Iodine Hives and Shortness Of Breath  . Ciprofloxacin Other (See Comments)  . Codeine Other (See Comments)  . Iodine Hives    Shock  . Sodium Sulfite   . Tape Rash    Plastic tape    Current Outpatient Prescriptions on File Prior to Visit  Medication Sig Dispense Refill  . acetaminophen (TYLENOL) 500 MG tablet Take 1,000 mg by mouth every 6 (six) hours as needed for moderate pain or headache.    . allopurinol (ZYLOPRIM) 100 MG tablet Take 1 tablet (100 mg total) by mouth daily. (Patient taking differently: Take 100 mg by mouth 2 (two) times daily. ) 90 tablet 3  . amLODipine (NORVASC) 10 MG tablet Take 1 tablet (10 mg total) by mouth 2 (two) times daily. 31 tablet 3  . aspirin EC 81 MG tablet Take 81 mg by mouth  daily.    . calcitRIOL (ROCALTROL) 0.25 MCG capsule     . cloNIDine (CATAPRES) 0.2 MG tablet 0.2 mg. Half tab twice daily    . clopidogrel (PLAVIX) 75 MG tablet TAKE 1 TABLET BY MOUTH DAILY 90 tablet 3  . colchicine 0.6 MG tablet Take 0.6 mg by mouth daily.    . dorzolamide-timolol (COSOPT) 22.3-6.8 MG/ML ophthalmic solution Place 1 drop into both eyes at bedtime.    . ferrous sulfate 325 (65 FE) MG tablet Take 325 mg by mouth daily with breakfast.    . furosemide (LASIX) 20 MG tablet Take 1 tablet (20 mg total) by mouth daily. 30 tablet 3  . glimepiride (AMARYL) 2 MG tablet TAKE 1/2 TABLET BY MOUTH DAILY. MAY TAKE 1 TABLET IF BLOOD SUGAR IS HIGH 30 tablet 5  . hydrALAZINE (APRESOLINE) 50 MG tablet TAKE 1 TABLET BY MOUTH TWICE A DAY 60 tablet 4  . KLOR-CON M10 10 MEQ tablet TAKE 1 TABLET EVERY MORNING 100 tablet 2  . labetalol (NORMODYNE) 200 MG tablet Take 200 mg by mouth 2 (two) times daily.  7  . Lancets (ONETOUCH ULTRASOFT) lancets TEST ONCE DAILY AS  DIRECTED 100 each 3  . levothyroxine (SYNTHROID, LEVOTHROID) 150 MCG tablet Take 150 mcg by mouth daily before breakfast.     . Melatonin 1 MG TABS Take 1 tablet by mouth at bedtime as needed (sleep).    . Multiple Vitamins-Minerals (PRESERVISION/LUTEIN PO) Take 1 tablet by mouth 2 (two) times daily.    . ONE TOUCH ULTRA TEST test strip TEST ONCE DAILY 100 each 5  . pantoprazole (PROTONIX) 40 MG tablet TAKE 1 TABLET BY MOUTH EVERY DAY 90 tablet 3  . TRAVATAN Z 0.004 % SOLN ophthalmic solution Place 1 drop into both eyes at bedtime.  3   No current facility-administered medications on file prior to visit.    BP 140/70 mmHg  Pulse 68  Temp(Src) 97.8 F (36.6 C) (Oral)  Resp 20  Ht 5\' 1"  (1.549 m)  Wt 191 lb (86.637 kg)  BMI 36.11 kg/m2  SpO2 96%     Review of Systems  Constitutional: Positive for fatigue.  HENT: Negative for congestion, dental problem, hearing loss, rhinorrhea, sinus pressure, sore throat and tinnitus.   Eyes: Negative for pain, discharge and visual disturbance.  Respiratory: Negative for cough and shortness of breath.   Cardiovascular: Negative for chest pain, palpitations and leg swelling.  Gastrointestinal: Negative for nausea, vomiting, abdominal pain, diarrhea, constipation, blood in stool and abdominal distention.  Genitourinary: Negative for dysuria, urgency, frequency, hematuria, flank pain, vaginal bleeding, vaginal discharge, difficulty urinating, vaginal pain and pelvic pain.  Musculoskeletal: Negative for joint swelling, arthralgias and gait problem.  Skin: Negative for rash.  Neurological: Negative for dizziness, syncope, speech difficulty, weakness, numbness and headaches.  Hematological: Negative for adenopathy.  Psychiatric/Behavioral: Positive for sleep disturbance. Negative for behavioral problems, dysphoric mood and agitation. The patient is not nervous/anxious.        Objective:   Physical Exam  Constitutional: She is oriented to person,  place, and time. She appears well-developed and well-nourished.  HENT:  Head: Normocephalic.  Right Ear: External ear normal.  Left Ear: External ear normal.  Mouth/Throat: Oropharynx is clear and moist.  Eyes: Conjunctivae and EOM are normal. Pupils are equal, round, and reactive to light.  Neck: Normal range of motion. Neck supple. No thyromegaly present.  Cardiovascular: Normal rate, regular rhythm and intact distal pulses.   Murmur heard. Grade 2/6 systolic murmur  Few ectopics  Pulmonary/Chest: Effort normal. She has rales.  Abdominal: Soft. Bowel sounds are normal. She exhibits no mass. There is no tenderness.  Musculoskeletal: Normal range of motion. She exhibits edema.  Trace edema  Lymphadenopathy:    She has no cervical adenopathy.  Neurological: She is alert and oriented to person, place, and time.  Skin: Skin is warm and dry. No rash noted.  Psychiatric: She has a normal mood and affect. Her behavior is normal.          Assessment & Plan:   Insomnia/fatigue.  Sleep hygiene issues discussed.  Will increase melatonin and observe.  Will attempt to minimize afternoon naps History of anemia.  Will check a CBC History of hypothyroidism.  We'll check a TSH Diabetes.  Will check a hemoglobin A1c Chronic kidney disease.  Will check renal indices   Follow-up PCP if unimproved or she develops any new or worsening symptoms

## 2014-09-26 NOTE — Progress Notes (Signed)
Pre visit review using our clinic review tool, if applicable. No additional management support is needed unless otherwise documented below in the visit note. 

## 2014-09-30 ENCOUNTER — Other Ambulatory Visit: Payer: Self-pay | Admitting: Family Medicine

## 2014-10-02 ENCOUNTER — Telehealth: Payer: Self-pay | Admitting: Family Medicine

## 2014-10-02 NOTE — Telephone Encounter (Signed)
Please call/notify patient that lab/test/procedure is stable.  Hemoglobin A1c normal at 5.5.  Chronic kidney disease and anemia, unchanged

## 2014-10-02 NOTE — Telephone Encounter (Signed)
Pt calling for lab results from 8/16, please advise.

## 2014-10-02 NOTE — Telephone Encounter (Signed)
Spoke to pt's daughter Cheri Rous, told her labs are stable. Hemoglobin A1c was normal at 5.5 Chronic Kidney disease and anemia are unchanged per Dr. Jerline Pain verbalized understanding.

## 2014-10-02 NOTE — Telephone Encounter (Signed)
Would like a call back about lab results

## 2014-10-19 DIAGNOSIS — E1151 Type 2 diabetes mellitus with diabetic peripheral angiopathy without gangrene: Secondary | ICD-10-CM | POA: Diagnosis not present

## 2014-10-19 DIAGNOSIS — L603 Nail dystrophy: Secondary | ICD-10-CM | POA: Diagnosis not present

## 2014-10-19 DIAGNOSIS — L84 Corns and callosities: Secondary | ICD-10-CM | POA: Diagnosis not present

## 2014-10-19 DIAGNOSIS — I739 Peripheral vascular disease, unspecified: Secondary | ICD-10-CM | POA: Diagnosis not present

## 2014-11-14 ENCOUNTER — Ambulatory Visit (INDEPENDENT_AMBULATORY_CARE_PROVIDER_SITE_OTHER): Payer: Medicare Other | Admitting: Family Medicine

## 2014-11-14 ENCOUNTER — Encounter: Payer: Self-pay | Admitting: Family Medicine

## 2014-11-14 VITALS — BP 130/80 | HR 68 | Temp 97.3°F | Wt 194.0 lb

## 2014-11-14 DIAGNOSIS — I5032 Chronic diastolic (congestive) heart failure: Secondary | ICD-10-CM | POA: Diagnosis not present

## 2014-11-14 DIAGNOSIS — M1990 Unspecified osteoarthritis, unspecified site: Secondary | ICD-10-CM | POA: Diagnosis not present

## 2014-11-14 DIAGNOSIS — E876 Hypokalemia: Secondary | ICD-10-CM

## 2014-11-14 DIAGNOSIS — I10 Essential (primary) hypertension: Secondary | ICD-10-CM

## 2014-11-14 LAB — CBC WITH DIFFERENTIAL/PLATELET
BASOS PCT: 0.3 % (ref 0.0–3.0)
Basophils Absolute: 0 10*3/uL (ref 0.0–0.1)
EOS PCT: 4.6 % (ref 0.0–5.0)
Eosinophils Absolute: 0.4 10*3/uL (ref 0.0–0.7)
HEMATOCRIT: 30.5 % — AB (ref 36.0–46.0)
HEMOGLOBIN: 9.9 g/dL — AB (ref 12.0–15.0)
LYMPHS PCT: 10.2 % — AB (ref 12.0–46.0)
Lymphs Abs: 0.9 10*3/uL (ref 0.7–4.0)
MCHC: 32.3 g/dL (ref 30.0–36.0)
MCV: 83 fl (ref 78.0–100.0)
MONO ABS: 0.7 10*3/uL (ref 0.1–1.0)
MONOS PCT: 7.8 % (ref 3.0–12.0)
Neutro Abs: 6.7 10*3/uL (ref 1.4–7.7)
Neutrophils Relative %: 77.1 % — ABNORMAL HIGH (ref 43.0–77.0)
Platelets: 300 10*3/uL (ref 150.0–400.0)
RBC: 3.68 Mil/uL — ABNORMAL LOW (ref 3.87–5.11)
RDW: 17.6 % — AB (ref 11.5–15.5)
WBC: 8.7 10*3/uL (ref 4.0–10.5)

## 2014-11-14 LAB — BASIC METABOLIC PANEL
BUN: 30 mg/dL — AB (ref 6–23)
CALCIUM: 9.2 mg/dL (ref 8.4–10.5)
CO2: 28 mEq/L (ref 19–32)
CREATININE: 1.55 mg/dL — AB (ref 0.40–1.20)
Chloride: 107 mEq/L (ref 96–112)
GFR: 33.14 mL/min — AB (ref >60.00)
GLUCOSE: 112 mg/dL — AB (ref 70–99)
Potassium: 3.7 mEq/L (ref 3.5–5.1)
Sodium: 143 mEq/L (ref 135–145)

## 2014-11-14 LAB — URIC ACID: Uric Acid, Serum: 4.4 mg/dL (ref 2.4–7.0)

## 2014-11-14 MED ORDER — CLONIDINE HCL 0.1 MG PO TABS: 0.1000 mg | ORAL_TABLET | Freq: Three times a day (TID) | ORAL | Status: DC

## 2014-11-14 NOTE — Patient Instructions (Signed)
Lasix 20 mg.......Marland Kitchen 1 in the morning and 1 at noon for 3 days then to 1 daily  Potassium........Marland Kitchen 1 in the morning and 1 at noon for 3 days........ then go back to 1 daily  Labs today  Follow-up in one week  Daily weights  Do not sit in a recliner............ lie in the bed with your feet elevated or the couch with your feet elevated.

## 2014-11-14 NOTE — Progress Notes (Signed)
   Subjective:    Patient ID: Amanda Cunningham, female    DOB: 1919/03/06, 81 y.o.   MRN: [id number]  HPI [name] is a 81 year old married female nonsmoker who comes in today company by her daughter [name] for evaluation of 3 issues  She has a history of chronic congestive heart failure. Weight is gone up 3 pounds in the last week. Her baseline weight is 191 here it's now 194. She's noticed some fluid retention and increased shortness of breath. 1 daily last week she took an extra Lasix tablet that helped somewhat. But she only did that for 1 day  She's also complaining of bilateral foot pain. She has a history of gout and takes allopurinol 100 mg twice a day. The pain in her foot started without any trauma and he went to her other foot.  She's on clonidine 0.1 and would like to get the 0.1 mg by mouth onset of splitting the 0.2 mg tablets.   Review of Systems    review of systems otherwise negative Objective:   Physical Exam Well-developed well-nourished female no acute distress vital signs stable she's afebrile BP 130/80 pulse 70 and regular pulse ox on room air 95%  Lungs shows symmetrical crackles says usual. Cardiac exam otherwise normal. 3+ chronic peripheral edema       Assessment & Plan:  Congestive heart failure........Marland Kitchen Lasix double dose for 3 days then go back to daily dose follow-up in 7 days  Foot pain with history gout........ check uric acid level  Hypertension......... clonidine 0.1.

## 2014-11-14 NOTE — Progress Notes (Signed)
Influenza immunization was not given due to patient is ill today and will reschedule.Marland Kitchen

## 2014-11-14 NOTE — Progress Notes (Signed)
Pre visit review using our clinic review tool, if applicable. No additional management support is needed unless otherwise documented below in the visit note. 

## 2014-11-15 ENCOUNTER — Telehealth: Payer: Self-pay | Admitting: *Deleted

## 2014-11-15 NOTE — Telephone Encounter (Signed)
Daughter called because mom has been taking Allopurinol 100 mg twice daily per her nephrologist.

## 2014-11-16 ENCOUNTER — Telehealth: Payer: Self-pay | Admitting: Family Medicine

## 2014-11-16 NOTE — Telephone Encounter (Signed)
Spoke with daughter and per Dr Sherren Mocha patient should continue current dose,  Have bedrest through Friday.  Amanda Cunningham will try to add 12 and iron to patient's labs already done.

## 2014-11-16 NOTE — Telephone Encounter (Signed)
Error

## 2014-11-21 ENCOUNTER — Encounter: Payer: Self-pay | Admitting: Family Medicine

## 2014-11-21 ENCOUNTER — Ambulatory Visit (INDEPENDENT_AMBULATORY_CARE_PROVIDER_SITE_OTHER): Payer: Medicare Other | Admitting: Family Medicine

## 2014-11-21 VITALS — BP 134/86 | HR 78 | Temp 98.5°F | Ht 62.0 in | Wt 192.0 lb

## 2014-11-21 DIAGNOSIS — D519 Vitamin B12 deficiency anemia, unspecified: Secondary | ICD-10-CM

## 2014-11-21 DIAGNOSIS — I5032 Chronic diastolic (congestive) heart failure: Secondary | ICD-10-CM | POA: Diagnosis not present

## 2014-11-21 DIAGNOSIS — D638 Anemia in other chronic diseases classified elsewhere: Secondary | ICD-10-CM | POA: Diagnosis not present

## 2014-11-21 DIAGNOSIS — Z23 Encounter for immunization: Secondary | ICD-10-CM | POA: Diagnosis not present

## 2014-11-21 LAB — CBC WITH DIFFERENTIAL/PLATELET
BASOS ABS: 0 10*3/uL (ref 0.0–0.1)
Basophils Relative: 0.1 % (ref 0.0–3.0)
EOS ABS: 0.2 10*3/uL (ref 0.0–0.7)
Eosinophils Relative: 1.9 % (ref 0.0–5.0)
HCT: 32.9 % — ABNORMAL LOW (ref 36.0–46.0)
Hemoglobin: 10.5 g/dL — ABNORMAL LOW (ref 12.0–15.0)
LYMPHS ABS: 0.7 10*3/uL (ref 0.7–4.0)
Lymphocytes Relative: 6.1 % — ABNORMAL LOW (ref 12.0–46.0)
MCHC: 32 g/dL (ref 30.0–36.0)
MCV: 83.1 fl (ref 78.0–100.0)
MONO ABS: 0.8 10*3/uL (ref 0.1–1.0)
Monocytes Relative: 6.6 % (ref 3.0–12.0)
NEUTROS PCT: 85.3 % — AB (ref 43.0–77.0)
Neutro Abs: 10.4 10*3/uL — ABNORMAL HIGH (ref 1.4–7.7)
Platelets: 317 10*3/uL (ref 150.0–400.0)
RBC: 3.97 Mil/uL (ref 3.87–5.11)
RDW: 17.8 % — ABNORMAL HIGH (ref 11.5–15.5)
WBC: 12.2 10*3/uL — ABNORMAL HIGH (ref 4.0–10.5)

## 2014-11-21 LAB — FERRITIN: Ferritin: 53.2 ng/mL (ref 10.0–291.0)

## 2014-11-21 LAB — BASIC METABOLIC PANEL
BUN: 37 mg/dL — ABNORMAL HIGH (ref 6–23)
CO2: 26 mEq/L (ref 19–32)
Calcium: 9.3 mg/dL (ref 8.4–10.5)
Chloride: 105 mEq/L (ref 96–112)
Creatinine, Ser: 1.78 mg/dL — ABNORMAL HIGH (ref 0.40–1.20)
GFR: 28.25 mL/min — ABNORMAL LOW (ref >60.00)
Glucose, Bld: 133 mg/dL — ABNORMAL HIGH (ref 70–99)
Potassium: 3.9 mEq/L (ref 3.5–5.1)
Sodium: 143 mEq/L (ref 135–145)

## 2014-11-21 LAB — VITAMIN B12: Vitamin B-12: 349 pg/mL (ref 211–911)

## 2014-11-21 LAB — TSH: TSH: 4.33 u[IU]/mL (ref 0.35–4.50)

## 2014-11-21 LAB — IRON: Iron: 18 ug/dL — ABNORMAL LOW (ref 42–145)

## 2014-11-21 NOTE — Patient Instructions (Signed)
Continue current medications  Labs today  I will call you the report

## 2014-11-21 NOTE — Progress Notes (Signed)
   Subjective:    Patient ID: Amanda Cunningham, female    DOB: [birth year], 81 y.o.   MRN: [id number]  HPI Amanda Cunningham is a 81 year old married female nonsmoker who comes in today for follow-up of congestive heart failure  Last week we put her at bedrest for 3 days. We also double her Lasix from 20 mg daily to 20 mg twice a day for 3 days. She states she's lost 3 pounds at home. On our scale she's lost 2 pounds. The shortness of breath is gone however she still feels very fatigued.  Recent hemoglobin 9.9. She tells me she has a history of anemia etiology unknown. She had a workup when she was at [facility] at the [place] [facility]. They finally gave her 2 units of blood and then she felt better. This is about 3 or 4 years ago.  Prior to the transfusion negative her IV iron which did not help.   Review of Systems    review of systems otherwise negative Objective:   Physical Exam  Well-developed well-nourished female no acute distress vital signs stable she's afebrile  Pulse 70 and regular  Lungs clear no crackles    Assessment & Plan:  Congestive heart failure markedly improved with doubling the Lasix and bedrest for 3 days..............Marland Kitchen  Anemia unknown etiology........ check labs

## 2014-11-22 NOTE — Assessment & Plan Note (Signed)
Relapsed left breast invasive lobular cancer 0.6 cm ER 100% PR 0% HER-2/neu negative Ki-67 13% T1 B. N0 M0 stage IA; Currently on observation without treatment  Patient has decided that she would not want to go on any antiestrogen therapy because previously it had led to depression and cloudiness in the head which cleared out once she stopped the Arimidex. She would like to only get treated for breast cancer if it was causing her any symptoms. I believe that is completely reasonable.  We can watch and monitor her periodically and I recommended a follow-up back to see [place] in 6 months. We decided not to do any further imaging investigations and we will clinically watch and monitor her.

## 2014-11-23 ENCOUNTER — Encounter: Payer: Self-pay | Admitting: Hematology and Oncology

## 2014-11-23 ENCOUNTER — Telehealth: Payer: Self-pay | Admitting: Hematology and Oncology

## 2014-11-23 ENCOUNTER — Ambulatory Visit (HOSPITAL_BASED_OUTPATIENT_CLINIC_OR_DEPARTMENT_OTHER): Payer: Medicare Other | Admitting: Hematology and Oncology

## 2014-11-23 VITALS — BP 159/47 | HR 75 | Temp 97.9°F | Resp 18 | Ht 62.0 in | Wt 189.2 lb

## 2014-11-23 DIAGNOSIS — C50512 Malignant neoplasm of lower-outer quadrant of left female breast: Secondary | ICD-10-CM

## 2014-11-23 DIAGNOSIS — D649 Anemia, unspecified: Secondary | ICD-10-CM

## 2014-11-23 DIAGNOSIS — N189 Chronic kidney disease, unspecified: Secondary | ICD-10-CM | POA: Diagnosis not present

## 2014-11-23 NOTE — Progress Notes (Signed)
Patient Care Team: Dorena Cookey, MD as PCP - General (Family Medicine) Dorena Cookey, MD (Family Medicine)  DIAGNOSIS: No matching staging information was found for the patient.  SUMMARY OF ONCOLOGIC HISTORY:   Breast cancer of lower-outer quadrant of left female breast ([place])   10/19/2013 Initial Diagnosis Left breast biopsy: Invasive lobular cancer, ER 100%, PR 0%, Ki-67 13%, currently on observation without treatment by patient preference   05/02/2014 - 05/05/2014 Hospital Admission Hospitalization for partial small bowel obstruction treated conservatively    CHIEF COMPLIANT: follow-up of breast cancer on no particular treatment  INTERVAL HISTORY: Amanda Cunningham is a 81 year old with above-mentioned history of left breast cancer currently on observation. She did not wish to be on any medications for the breast cancer. She wanted to follow it clinically. She does not want mammograms or ultrasounds or any other investigations.  Her family reports that she is more tired than usual.  REVIEW OF SYSTEMS:   Constitutional: Denies fevers, chills or abnormal weight loss, wheelchair-bound Eyes: Denies blurriness of vision Ears, nose, mouth, throat, and face: Denies mucositis or sore throat Respiratory: Denies cough, dyspnea or wheezes Cardiovascular: Denies palpitation, chest discomfort or lower extremity swelling Gastrointestinal:  constipation Skin: Denies abnormal skin rashes Lymphatics: Denies new lymphadenopathy or easy bruising Neurological:Denies numbness, tingling or new weaknesses Behavioral/Psych: Mood is stable, no new changes  Breast:  denies any pain or lumps or nodules in either breasts All other systems were reviewed with the patient and are negative.  I have reviewed the past medical history, past surgical history, social history and family history with the patient and they are unchanged from previous note.  ALLERGIES:  is allergic to iodine; ciprofloxacin; codeine; iodine;  sodium sulfite; and tape.  MEDICATIONS:  Current Outpatient Prescriptions  Medication Sig Dispense Refill  . acetaminophen (TYLENOL) 500 MG tablet Take 1,000 mg by mouth every 6 (six) hours as needed for moderate pain or headache.    . allopurinol (ZYLOPRIM) 100 MG tablet Take 1 tablet (100 mg total) by mouth daily. (Patient taking differently: Take 100 mg by mouth 2 (two) times daily. ) 90 tablet 3  . amLODipine (NORVASC) 10 MG tablet Take 1 tablet (10 mg total) by mouth 2 (two) times daily. 31 tablet 3  . aspirin EC 81 MG tablet Take 81 mg by mouth daily.    . calcitRIOL (ROCALTROL) 0.25 MCG capsule     . cloNIDine (CATAPRES) 0.1 MG tablet Take 1 tablet (0.1 mg total) by mouth 3 (three) times daily. 90 tablet 3  . clopidogrel (PLAVIX) 75 MG tablet TAKE 1 TABLET BY MOUTH DAILY 90 tablet 3  . colchicine 0.6 MG tablet Take 0.6 mg by mouth daily.    . dorzolamide-timolol (COSOPT) 22.3-6.8 MG/ML ophthalmic solution Place 1 drop into both eyes at bedtime.    . ferrous sulfate 325 (65 FE) MG tablet Take 325 mg by mouth daily with breakfast.    . furosemide (LASIX) 20 MG tablet TAKE 1 TABLET (20 MG TOTAL) BY MOUTH DAILY. 30 tablet 3  . glimepiride (AMARYL) 2 MG tablet TAKE 1/2 TABLET BY MOUTH DAILY. MAY TAKE 1 TABLET IF BLOOD SUGAR IS HIGH 30 tablet 5  . hydrALAZINE (APRESOLINE) 50 MG tablet TAKE 1 TABLET BY MOUTH TWICE A DAY 60 tablet 4  . KLOR-CON M10 10 MEQ tablet TAKE 1 TABLET EVERY MORNING 100 tablet 2  . labetalol (NORMODYNE) 200 MG tablet Take 200 mg by mouth 2 (two) times daily.  7  .  Lancets (ONETOUCH ULTRASOFT) lancets TEST ONCE DAILY AS DIRECTED 100 each 3  . levothyroxine (SYNTHROID, LEVOTHROID) 150 MCG tablet Take 150 mcg by mouth daily before breakfast.     . Melatonin 1 MG TABS Take 1 tablet by mouth at bedtime as needed (sleep).    . Multiple Vitamins-Minerals (PRESERVISION/LUTEIN PO) Take 1 tablet by mouth 2 (two) times daily.    . ONE TOUCH ULTRA TEST test strip TEST ONCE DAILY  100 each 5  . pantoprazole (PROTONIX) 40 MG tablet TAKE 1 TABLET BY MOUTH EVERY DAY 90 tablet 3  . TRAVATAN Z 0.004 % SOLN ophthalmic solution Place 1 drop into both eyes at bedtime.  3   No current facility-administered medications for this visit.    PHYSICAL EXAMINATION: ECOG PERFORMANCE STATUS: 2 - Symptomatic, <50% confined to bed  Filed Vitals:   11/23/14 1128  BP: 159/47  Pulse: 75  Temp: 97.9 F (36.6 C)  Resp: 18   Filed Weights   11/23/14 1128  Weight: 189 lb 3.2 oz (85.821 kg)    GENERAL:alert, no distress and comfortable SKIN: skin color, texture, turgor are normal, no rashes or significant lesions EYES: normal, Conjunctiva are pink and non-injected, sclera clear OROPHARYNX:no exudate, no erythema and lips, buccal mucosa, and tongue normal  NECK: supple, thyroid normal size, non-tender, without nodularity LYMPH:  no palpable lymphadenopathy in the cervical, axillary or inguinal LUNGS: clear to auscultation and percussion with normal breathing effort HEART: regular rate & rhythm and no murmurs and no lower extremity edema ABDOMEN:abdomen soft, non-tender and normal bowel sounds Musculoskeletal:no cyanosis of digits and no clubbing  NEURO: alert & oriented x 3 with fluent speech, no focal motor/sensory deficits BREAST:I could not palpate any lumps or nodules in either breast. (exam performed in the presence of a chaperone)  LABORATORY DATA:  I have reviewed the data as listed   Chemistry      Component Value Date/Time   NA 143 11/21/2014 1058   K 3.9 11/21/2014 1058   CL 105 11/21/2014 1058   CO2 26 11/21/2014 1058   BUN 37* 11/21/2014 1058   CREATININE 1.78* 11/21/2014 1058   CREATININE 1.49* 12/21/2012 1139      Component Value Date/Time   CALCIUM 9.3 11/21/2014 1058   ALKPHOS 80 09/26/2014 1210   AST 14 09/26/2014 1210   ALT 8 09/26/2014 1210   BILITOT 0.4 09/26/2014 1210       Lab Results  Component Value Date   WBC 12.2* 11/21/2014   HGB  10.5* 11/21/2014   HCT 32.9* 11/21/2014   MCV 83.1 11/21/2014   PLT 317.0 11/21/2014   NEUTROABS 10.4* 11/21/2014   ASSESSMENT & PLAN:  Breast cancer of lower-outer quadrant of left female breast Relapsed left breast invasive lobular cancer 0.6 cm ER 100% PR 0% HER-2/neu negative Ki-67 13% T1 B. N0 M0 stage IA; Currently on observation without treatment  Patient has decided that she would not want to go on any antiestrogen therapy because previously it had led to depression and cloudiness in the head which cleared out once she stopped the Arimidex. She would like to only get treated for breast cancer if it was causing her any symptoms. I believe that is completely reasonable.  We can watch and monitor her periodically and I recommended a follow-up back to see [place] in 6 months. We decided not to do any further imaging investigations and we will clinically watch and monitor her.  Normocytic anemia: I reviewed the recent blood work  along with iron studies. I do not believe she has any marked iron deficiency anemia. She does have low serum iron and ferritin is normal. B12 was also normal. I suspect her anemia could be due to chronic kidney disease.  Return to clinic in 6 months for continued clinical monitoring and follow-up.  No orders of the defined types were placed in this encounter.   The patient has a good understanding of the overall plan. she agrees with it. she will call with any problems that may develop before the next visit here.   Rulon Eisenmenger, MD 11/23/2014

## 2014-11-23 NOTE — Telephone Encounter (Signed)
Appointments made and avs pritned for pateint °

## 2014-12-10 ENCOUNTER — Other Ambulatory Visit: Payer: Self-pay | Admitting: Family Medicine

## 2014-12-26 ENCOUNTER — Ambulatory Visit: Payer: Medicare Other | Admitting: Family Medicine

## 2015-01-03 ENCOUNTER — Ambulatory Visit (INDEPENDENT_AMBULATORY_CARE_PROVIDER_SITE_OTHER): Payer: Medicare Other | Admitting: Family Medicine

## 2015-01-03 VITALS — BP 160/70 | HR 68 | Temp 97.6°F | Wt 186.0 lb

## 2015-01-03 DIAGNOSIS — I5032 Chronic diastolic (congestive) heart failure: Secondary | ICD-10-CM | POA: Diagnosis not present

## 2015-01-03 NOTE — Progress Notes (Signed)
   Subjective:    Patient ID: [name], female    DOB: [birth year], 81 y.o.   MRN: [id number]  HPI Kylen is a 81 year old married female nonsmoker who comes in today accompanied by her daughter [name] for evaluation of diastolic heart failure  She says overall she feels well. She's lost 3 pounds. She's down to 186. Pulse ox on room air 96%. BP 160/70 which is normal for her.  No complaints  Med list correct   Review of Systems Review of systems negative except she's concerned about her husband. He is in chronic pain and depressed. He went to see his cardiologist at the [facility] clinic last week and was told everything was stable    Objective:   Physical Exam  Well-developed well-nourished female no acute distress vital signs stable she's afebrile lungs are clear no crackles regular sinus rhythm      Assessment & Plan:  Diastolic heart failure stable..... Continue current therapy..... Follow-up in 3 months

## 2015-01-03 NOTE — Patient Instructions (Signed)
Continue current medications  Continue your weight loss  Follow-up in 3 months sooner if any problems

## 2015-01-03 NOTE — Progress Notes (Signed)
Pre visit review using our clinic review tool, if applicable. No additional management support is needed unless otherwise documented below in the visit note. 

## 2015-01-11 DIAGNOSIS — I739 Peripheral vascular disease, unspecified: Secondary | ICD-10-CM | POA: Diagnosis not present

## 2015-01-11 DIAGNOSIS — E1151 Type 2 diabetes mellitus with diabetic peripheral angiopathy without gangrene: Secondary | ICD-10-CM | POA: Diagnosis not present

## 2015-01-11 DIAGNOSIS — L603 Nail dystrophy: Secondary | ICD-10-CM | POA: Diagnosis not present

## 2015-01-13 ENCOUNTER — Other Ambulatory Visit: Payer: Self-pay | Admitting: Family Medicine

## 2015-02-11 ENCOUNTER — Other Ambulatory Visit: Payer: Self-pay | Admitting: Family Medicine

## 2015-02-14 ENCOUNTER — Other Ambulatory Visit: Payer: Self-pay | Admitting: Family Medicine

## 2015-02-22 ENCOUNTER — Telehealth: Payer: Self-pay | Admitting: Family Medicine

## 2015-02-22 NOTE — Telephone Encounter (Signed)
Pt call to say that Dr Sherren Mocha wants to see about her right knee that she injured. She fell getting out of bed and said her knee not getting any better. Pt is asking to come in on Monday.

## 2015-02-22 NOTE — Telephone Encounter (Signed)
Okay to work in Monday thanks

## 2015-02-26 ENCOUNTER — Encounter: Payer: Self-pay | Admitting: Family Medicine

## 2015-02-26 ENCOUNTER — Ambulatory Visit (INDEPENDENT_AMBULATORY_CARE_PROVIDER_SITE_OTHER): Payer: Medicare Other | Admitting: Family Medicine

## 2015-02-26 VITALS — BP 110/60

## 2015-02-26 DIAGNOSIS — E119 Type 2 diabetes mellitus without complications: Secondary | ICD-10-CM | POA: Diagnosis not present

## 2015-02-26 DIAGNOSIS — R269 Unspecified abnormalities of gait and mobility: Secondary | ICD-10-CM

## 2015-02-26 DIAGNOSIS — M1990 Unspecified osteoarthritis, unspecified site: Secondary | ICD-10-CM | POA: Diagnosis not present

## 2015-02-26 DIAGNOSIS — N189 Chronic kidney disease, unspecified: Secondary | ICD-10-CM

## 2015-02-26 DIAGNOSIS — Z96653 Presence of artificial knee joint, bilateral: Secondary | ICD-10-CM

## 2015-02-26 DIAGNOSIS — Z9181 History of falling: Secondary | ICD-10-CM

## 2015-02-26 DIAGNOSIS — Z96659 Presence of unspecified artificial knee joint: Secondary | ICD-10-CM | POA: Insufficient documentation

## 2015-02-26 DIAGNOSIS — N183 Chronic kidney disease, stage 3 unspecified: Secondary | ICD-10-CM

## 2015-02-26 DIAGNOSIS — I5032 Chronic diastolic (congestive) heart failure: Secondary | ICD-10-CM

## 2015-02-26 DIAGNOSIS — R0602 Shortness of breath: Secondary | ICD-10-CM

## 2015-02-26 NOTE — Progress Notes (Signed)
   Subjective:    Patient ID: Amanda Cunningham, female    DOB: Oct 18, 1919, 82 y.o.   MRN: [id number]  HPI  Berdina is a 82 year old married female nonsmoker who comes in today accompanied by her daughter [name] for evaluation of multiple issues  The most recent issue is that a week ago attempting to get out of bed she fell. She's had bilateral knee replacements. She walks with a 4. Walker. Over the last 6-12 months she's had more more difficulty walking and more more pain. She also had  A fall last summer where she fell backwards when she lost her balance.   She still complaining of pain in the left posterior knee. No bruising.   She has chronic hip and knee pain despite the bilateral knee replacements. She has severe degenerative arthritis. A hospital bed will allow her head to be elevated because she also has a history of congestive heart failure and will allow the bed to go down so she can get to her 4 point walker to prevent further falls. This is not feasible within normal bed. Her daughter indeed it has bought her a new commercial bed to try to alleviate the problem but that doesn't work. Her pain is pretty much constant and she declines to take any pain medication. The hospital bed will allow her to get in and out of bed improve that further falls.   Her weight is been steady at home. Her CHF is stable   Review of Systems     Review of systems otherwise negative Objective:   Physical Exam   well-developed but overweight female no acute distress   Examination the right knee shows swelling of the right knee and light lower extremity. She says the device that was put in was larger the and the device deployed in her left knee. She also had some postop lymphedema. There is no bruising. There is some tenderness in the posterior left hamstring muscles. Her knee device itself I think is intact. No bony tenderness.  Cardiopulmonary exam lungs are clear pulse 70 and regular   She comes in today  copy by her daughter. She is trying to get around with a cane however her daughter has to help her up.   With a long discussion about the future. I recommend that she get a hospital bed to prevent future falls also that she use a 4-point walker continuously and retire the cane. I also recommended her husband who lives there with her helper every morning to get in and out of bed. I also think to physical therapy may be of value.      Assessment & Plan:   severe degenerative joint disease status post bilateral knee replacements with fall. Fortunately she did not dislocate the device nor have any bony injury. I think her pain is soft tissue. We'll treat that symptomatically with elevation and ice and physical therapy   Also recommend hospital bed as noted above because of her difficulty getting in and out of bed as we have outlined above.   Congestive heart failure stable......... Continue current medication

## 2015-02-26 NOTE — Progress Notes (Signed)
Pre visit review using our clinic review tool, if applicable. No additional management support is needed unless otherwise documented below in the visit note. 

## 2015-02-26 NOTE — Patient Instructions (Signed)
Use the 4 point walker all the time  Retire the cane  We will request a hospital bed. Also would recommend the her husband help you get in and out of bed. Also physical therapy to help evaluate your gait mobilization etc. Etc.

## 2015-03-06 ENCOUNTER — Ambulatory Visit: Payer: Medicare Other | Attending: Family Medicine

## 2015-03-06 DIAGNOSIS — R531 Weakness: Secondary | ICD-10-CM | POA: Insufficient documentation

## 2015-03-06 DIAGNOSIS — R262 Difficulty in walking, not elsewhere classified: Secondary | ICD-10-CM | POA: Insufficient documentation

## 2015-03-06 DIAGNOSIS — R269 Unspecified abnormalities of gait and mobility: Secondary | ICD-10-CM | POA: Diagnosis not present

## 2015-03-06 NOTE — Patient Instructions (Signed)
KNEE: Extension, Long Arc Quad (Weight)  Place weight around leg. Raise leg until knee is straight. Hold _5__ seconds. Use ___ lb weight. _10__ reps per set (each leg), 4-5__ sets per day, __7_ days per week  Copyright  VHI. All rights reserved.    Reverse Fly / Shoulder Retraction   Extend both arms in front of body at shoulder height, palms down, holding band. Move arms out to sides, squeeze shoulder blades together. Repeat _10__ times. Do _4-5__ sessions per day.   Copyright  VHI. All rights reserved.     Knee Raise   Lift knee and then lower it. Repeat with other knee. Repeat _10__ times each leg. Do _4-5___ sessions per day.  http://gt2.exer.us/445   Copyright  VHI. All rights reserved.  Toe Up   Gently rise up on toes and back on heels. Repeat _20___ times. Do 4-5____ sessions per day.  Fall Prevention and Home Safety Falls cause injuries and can affect all age groups. It is possible to use preventive measures to significantly decrease the likelihood of falls. There are many simple measures which can make your home safer and prevent falls. OUTDOORS  Repair cracks and edges of walkways and driveways.  Remove high doorway thresholds.  Trim shrubbery on the main path into your home.  Have good outside lighting.  Clear walkways of tools, rocks, debris, and clutter.  Check that handrails are not broken and are securely fastened. Both sides of steps should have handrails.  Have leaves, snow, and ice cleared regularly.  Use sand or salt on walkways during winter months.  In the garage, clean up grease or oil spills. BATHROOM  Install night lights.  Install grab bars by the toilet and in the tub and shower.  Use non-skid mats or decals in the tub or shower.  Place a plastic non-slip stool in the shower to sit on, if needed.  Keep floors dry and clean up all water on the floor immediately.  Remove soap buildup in the tub or shower on a regular  basis.  Secure bath mats with non-slip, double-sided rug tape.  Remove throw rugs and tripping hazards from the floors. BEDROOMS  Install night lights.  Make sure a bedside light is easy to reach.  Do not use oversized bedding.  Keep a telephone by your bedside.  Have a firm chair with side arms to use for getting dressed.  Remove throw rugs and tripping hazards from the floor. KITCHEN  Keep handles on pots and pans turned toward the center of the stove. Use back burners when possible.  Clean up spills quickly and allow time for drying.  Avoid walking on wet floors.  Avoid hot utensils and knives.  Position shelves so they are not too high or low.  Place commonly used objects within easy reach.  If necessary, use a sturdy step stool with a grab bar when reaching.  Keep electrical cables out of the way.  Do not use floor polish or wax that makes floors slippery. If you must use wax, use non-skid floor wax.  Remove throw rugs and tripping hazards from the floor. STAIRWAYS  Never leave objects on stairs.  Place handrails on both sides of stairways and use them. Fix any loose handrails. Make sure handrails on both sides of the stairways are as long as the stairs.  Check carpeting to make sure it is firmly attached along stairs. Make repairs to worn or loose carpet promptly.  Avoid placing throw rugs at the   top or bottom of stairways, or properly secure the rug with carpet tape to prevent slippage. Get rid of throw rugs, if possible.  Have an electrician put in a light switch at the top and bottom of the stairs. OTHER FALL PREVENTION TIPS  Wear low-heel or rubber-soled shoes that are supportive and fit well. Wear closed toe shoes.  When using a stepladder, make sure it is fully opened and both spreaders are firmly locked. Do not climb a closed stepladder.  Add color or contrast paint or tape to grab bars and handrails in your home. Place contrasting color strips on  first and last steps.  Learn and use mobility aids as needed. Install an electrical emergency response system.  Turn on lights to avoid dark areas. Replace light bulbs that burn out immediately. Get light switches that glow.  Arrange furniture to create clear pathways. Keep furniture in the same place.  Firmly attach carpet with non-skid or double-sided tape.  Eliminate uneven floor surfaces.  Select a carpet pattern that does not visually hide the edge of steps.  Be aware of all pets. OTHER HOME SAFETY TIPS  Set the water temperature for 120 F (48.8 C).  Keep emergency numbers on or near the telephone.  Keep smoke detectors on every level of the home and near sleeping areas. Document Released: 01/17/2002 Document Revised: 07/29/2011 Document Reviewed: 04/18/2011 ExitCare Patient Information 2015 [employer]. This information is not intended to replace advice given to you by your health care provider. Make sure you discuss any questions you have with your health care provider.  [facility] [address], [address] [place], [place] [address] Phone # [phone] Fax [phone]  

## 2015-03-06 NOTE — Therapy (Signed)
[facility] [address], [place], [place], [address] Phone: [phone]   Fax:  [phone]  Physical Therapy Evaluation  Patient Details  Name: Amanda Cunningham MRN: [id number] Date of Birth: 1919/06/14 Referring Provider: Stevie Kern, MD  Encounter Date: 03/06/2015      PT End of Session - 03/06/15 1519    Visit Number 1   Number of Visits 10   Date for PT Re-Evaluation 05/01/15   PT Start Time [id number]   PT Stop Time 1524   PT Time Calculation (min) 33 min   Activity Tolerance Patient tolerated treatment well   Behavior During Therapy [facility] for tasks assessed/performed      Past Medical History  Diagnosis Date  . Anemia   . Glaucoma   . Diabetes mellitus   . Hypertension   . Thyroid disease   . Arthritis   . Cancer ([place])     breast  . Chronic kidney disease   . PAD (peripheral artery disease) ([place])   . Neuropathy ([place])   . Diabetes mellitus without complication ([place])   . Cancer ([place])   . Breast CA [facility])     Past Surgical History  Procedure Laterality Date  . Total hip arthroplasty      left  . Replacement total knee      right  . Eye surgery    . Cholecystectomy    . Breast lumpectomy      left  . Ureteral stent placement      There were no vitals filed for this visit.  Visit Diagnosis:  Abnormality of gait - Plan: PT plan of care cert/re-cert  Difficulty walking - Plan: PT plan of care cert/re-cert  Weakness generalized - Plan: PT plan of care cert/re-cert      Subjective Assessment - 03/06/15 1456    Subjective Pt presents to PT with complaints of balance and gait deficits of a chronic nature.  Pt had a fall off of her bed 2 weeks ago and hit her Lt knee.  Pt walks with a cane.    Pertinent History active breast cancer- choosing not to treat this at this time   Limitations Standing;Walking   How long can you stand comfortably? 10 minutes   How long can you walk comfortably? 10  minutes   Diagnostic tests none   Patient Stated Goals improve balance, improve endurance   Currently in Pain? No/denies            [facility] PT Assessment - 03/06/15 0001    Assessment   Medical Diagnosis arthrisits, gait difficulty, history of TKA, history of recent falls   Referring Provider Stevie Kern, MD   Onset Date/Surgical Date 02/20/15  chronic balance, fall 2 weeks ago   Next MD Visit unknown   Prior Therapy 3 years ago for balance/gait   Precautions   Precautions Fall;Other (comment)  active breast cancer- not receiving treatment   Restrictions   Weight Bearing Restrictions No   Balance Screen   Has the patient fallen in the past 6 months Yes   How many times? 1  off the edge of the bed   Has the patient had a decrease in activity level because of a fear of falling?  Yes   Is the patient reluctant to leave their home because of a fear of falling?  Yes   [place] Private residence   Living Arrangements Spouse/significant other   Type of Home  House   Home Access Stairs to enter   Entrance Stairs-Number of Steps [place] to live on main level with bedroom/bathroom   Prior Function   Level of Independence Independent with basic ADLs;Independent with household mobility with device;Independent with gait;Independent with transfers;Requires assistive device for independence   Vocation Retired   Leisure reading   Cognition   Overall Cognitive Status Within Functional Limits for tasks assessed   ROM / Strength   AROM / PROM / Strength AROM;Strength   AROM   Overall AROM  Within functional limits for tasks performed   Strength   Overall Strength Deficits   Strength Assessment Site Hip;Knee;Ankle   Right/Left Hip Right;Left   Right Hip Flexion 4/5   Left Hip Flexion 4/5   Right/Left Knee Right;Left   Right Knee Flexion 4-/5   Right Knee Extension 4/5   Left Knee Flexion 4-/5   Left Knee Extension 4/5   Right/Left Ankle  Right;Left   Right Ankle Dorsiflexion 3+/5   Left Ankle Dorsiflexion 4-/5   Transfers   Transfers Sit to Stand;Stand to Sit   Sit to Stand 5: Supervision;With upper extremity assist;With armrests   Ambulation/Gait   Ambulation/Gait Yes   Ambulation Distance (Feet) 100 Feet   Assistive device Straight cane   Gait Pattern Step-to pattern;Trunk flexed;Decreased dorsiflexion - right;Decreased dorsiflexion - left   Ambulation Surface Level   Balance   Balance Assessed Yes   Standardized Balance Assessment   Standardized Balance Assessment Timed Up and Go Test   Timed Up and Go Test   TUG Normal TUG   Normal TUG (seconds) 22   TUG Comments use of cane                           PT Education - 03/06/15 1514    Education provided Yes   Education Details HEP: seated strength for LEs and fall prevention   Person(s) Educated Patient   Methods Explanation;Demonstration;Handout   Comprehension Verbalized understanding;Returned demonstration          PT Short Term Goals - 03/06/15 1527    PT SHORT TERM GOAL #1   Title be independent in initial HEP   Time 4   Period Weeks   Status New   PT SHORT TERM GOAL #2   Title perform TUG in < or = to 20 seconds to imrpove safety   Time 4   Period Weeks   Status New   PT SHORT TERM GOAL #3   Title demonstrate 4/5 bil. DF strength to improve safety with gait   Time 4   Period Weeks   Status New           PT Long Term Goals - 03/06/15 1529    PT LONG TERM GOAL #1   Title be independent in advanced HEP   Time 8   Period Weeks   Status New   PT LONG TERM GOAL #2   Title perform TUG in < or = to 17 seconds to imrpove safety   Time 8   Period Weeks   Status New   PT LONG TERM GOAL #3   Title improve LE strength to perform sit to stand with moderate UE with mod-->max UE support with use of arm rests   Time 8   Period Weeks   Status New   PT LONG TERM GOAL #4   Title improve endurance to tolerate standing for  15 minutes without need for rest while cooking   Time 8   Period Weeks   Status New               Plan - 03-14-2015 1523    Clinical Impression Statement Pt presents to PT with complaints of gait and balance deficits and LE weakness of a chronic nature.  Pt fell off the side of her bed ~2 weeks ago and hit her Lt knee.  Pain is now resolved. Pt demonstrates gait instability on level surface with use of a cane, decreased LE strength, TUG score of 22 seconds and max UE support with sit to stand.  Pt will benefit from skilled PT for LE strength, balance and gait training to imrpove safety at home and in the community.   Pt will benefit from skilled therapeutic intervention in order to improve on the following deficits Abnormal gait;Difficulty walking;Decreased strength;Decreased balance;Decreased activity tolerance;Decreased safety awareness   Rehab Potential Good   PT Frequency 2x / week   PT Duration 8 weeks   PT Treatment/Interventions ADLs/Self Care Home Management;Cryotherapy;Moist Heat;Therapeutic exercise;Therapeutic activities;Stair training;Gait training;Balance training;Neuromuscular re-education;Patient/family education;Manual techniques;Passive range of motion   PT Next Visit Plan Gait, balance, LE strength training.     Consulted and Agree with Plan of Care Patient          G-Codes - 03/14/2015 1502    Functional Assessment Tool Used TUG and clinical   Functional Limitation Mobility: Walking and moving around   Mobility: Walking and Moving Around Current Status [phone]) At least 60 percent but less than 80 percent impaired, limited or restricted   Mobility: Walking and Moving Around Goal Status [phone]) At least 60 percent but less than 80 percent impaired, limited or restricted       Problem List Patient Active Problem List   Diagnosis Date Noted  . History of total knee replacement 02/26/2015  . CKD (chronic kidney disease), stage III 05/03/2014  . Chronic diastolic  heart failure ([place]) 05/03/2014  . DM (diabetes mellitus) ([place]) 05/03/2014  . Diabetes mellitus ([place]) 05/02/2014  . SBO (small bowel obstruction) ([place]) 05/02/2014  . Chronic diastolic congestive heart failure ([place]) 07/11/2013  . CAP (community acquired pneumonia) 06/15/2013  . Arthritis 05/25/2013  . Shortness of breath 04/25/2013  . Hypertension, malignant 05/03/2012  . Chronic renal insufficiency, stage III (moderate) 04/01/2012  . DM (diabetes mellitus) ([place]) 05/12/2011  . Anemia 05/12/2011  . Thyroid disease 05/12/2011  . Breast cancer of lower-outer quadrant of left female breast ([place]) 05/12/2011  . Neuropathy ([place]) 05/12/2011  . SBO (small bowel obstruction) ([place]) 05/12/2011  . Hypokalemia 05/12/2011  . Dehydration 05/12/2011  . Leukocytosis 05/12/2011    TAKACS,KELLY, PT March 14, 2015, 3:36 PM  [facility] [address] [place], [place], [address] Phone: [phone]   Fax:  [phone]  Name: NYEL HURSH MRN: [id number] Date of Birth: [birth year]

## 2015-03-14 ENCOUNTER — Ambulatory Visit: Payer: Medicare Other | Attending: Family Medicine

## 2015-03-14 ENCOUNTER — Other Ambulatory Visit: Payer: Self-pay | Admitting: Family Medicine

## 2015-03-14 DIAGNOSIS — R262 Difficulty in walking, not elsewhere classified: Secondary | ICD-10-CM | POA: Diagnosis not present

## 2015-03-14 DIAGNOSIS — R269 Unspecified abnormalities of gait and mobility: Secondary | ICD-10-CM | POA: Insufficient documentation

## 2015-03-14 DIAGNOSIS — R531 Weakness: Secondary | ICD-10-CM | POA: Insufficient documentation

## 2015-03-14 NOTE — Therapy (Signed)
[facility] [address] [place], [place], [address] Phone: [phone]   Fax:  [phone]  Physical Therapy Treatment  Patient Details  Name: Amanda Cunningham MRN: [id number] Date of Birth: May 22, 1919 Referring Provider: Stevie Kern, MD  Encounter Date: 03/14/2015      PT End of Session - 03/14/15 1307    Visit Number 2   Number of Visits 10   Date for PT Re-Evaluation 05/01/15   PT Start Time [id number]   PT Stop Time 1306   PT Time Calculation (min) 35 min   Activity Tolerance Patient limited by fatigue   Behavior During Therapy [facility] for tasks assessed/performed      Past Medical History  Diagnosis Date  . Anemia   . Glaucoma   . Diabetes mellitus   . Hypertension   . Thyroid disease   . Arthritis   . Cancer ([place])     breast  . Chronic kidney disease   . PAD (peripheral artery disease) ([place])   . Neuropathy ([place])   . Diabetes mellitus without complication ([place])   . Cancer ([place])   . Breast CA [facility])     Past Surgical History  Procedure Laterality Date  . Total hip arthroplasty      left  . Replacement total knee      right  . Eye surgery    . Cholecystectomy    . Breast lumpectomy      left  . Ureteral stent placement      There were no vitals filed for this visit.  Visit Diagnosis:  Abnormality of gait  Difficulty walking  Weakness generalized      Subjective Assessment - 03/14/15 1242    Subjective Pt reports that she isnt feeling well.  Tired today.   Currently in Pain? No/denies  Legs feel sore                         [facility] Adult PT Treatment/Exercise - 03/14/15 0001    Exercises   Exercises Knee/Hip;Lumbar;Ankle   Lumbar Exercises: Seated   Other Seated Lumbar Exercises shoulder flexion to 90 degrees 2x10   Knee/Hip Exercises: Aerobic   Nustep Level 1 x6 minutes  seat 7, arms 9   Knee/Hip Exercises: Seated   Long Arc Quad Strengthening;Both;2 sets;10 reps   Marching Strengthening;Both;2 sets;10 reps   Marching Limitations fatigue today   Abduction/Adduction  Strengthening;Both;2 sets;10 reps   Abd/Adduction Limitations red band with abduction, ball squeeze with adduction   Ankle Exercises: Seated   Heel Raises 20 reps   Toe Raise 20 reps   Other Seated Ankle Exercises rockerboard x 3 minutes                  PT Short Term Goals - 03/14/15 1250    PT SHORT TERM GOAL #1   Title be independent in initial HEP   Time 4   Period Weeks   Status On-going   PT SHORT TERM GOAL #2   Title perform TUG in < or = to 20 seconds to imrpove safety   Time 4   Period Weeks   Status On-going  just tested last session           PT Long Term Goals - 03/06/15 1529    PT LONG TERM GOAL #1   Title be independent in advanced HEP   Time 8   Period Weeks   Status New  PT LONG TERM GOAL #2   Title perform TUG in < or = to 17 seconds to imrpove safety   Time 8   Period Weeks   Status New   PT LONG TERM GOAL #3   Title improve LE strength to perform sit to stand with moderate UE with mod-->max UE support with use of arm rests   Time 8   Period Weeks   Status New   PT LONG TERM GOAL #4   Title improve endurance to tolerate standing for 15 minutes without need for rest while cooking   Time 8   Period Weeks   Status New               Plan - 03/14/15 1252    Clinical Impression Statement Pt with only 1 session after evaluation.  Pt is independent in current HEP for LE strength in sitting.  Pt reported fatigue with seated strength exercises today.  Pt continues to demonstrate gait instability on level surfaces with use of a cane, decreased LE strength and max UE support with sit to stand.  Pt will continue to benefit from skilled PT for LE strength, endurance and gait.   Pt will benefit from skilled therapeutic intervention in order to improve on the following deficits Abnormal gait;Difficulty walking;Decreased strength;Decreased  balance;Decreased activity tolerance;Decreased safety awareness   Rehab Potential Good   PT Frequency 2x / week   PT Duration 8 weeks   PT Treatment/Interventions ADLs/Self Care Home Management;Cryotherapy;Moist Heat;Therapeutic exercise;Therapeutic activities;Stair training;Gait training;Balance training;Neuromuscular re-education;Patient/family education;Manual techniques;Passive range of motion   PT Next Visit Plan Gait, balance, LE strength training.     Consulted and Agree with Plan of Care Patient        Problem List Patient Active Problem List   Diagnosis Date Noted  . History of total knee replacement 02/26/2015  . CKD (chronic kidney disease), stage III 05/03/2014  . Chronic diastolic heart failure ([place]) 05/03/2014  . DM (diabetes mellitus) ([place]) 05/03/2014  . Diabetes mellitus ([place]) 05/02/2014  . SBO (small bowel obstruction) ([place]) 05/02/2014  . Chronic diastolic congestive heart failure ([place]) 07/11/2013  . CAP (community acquired pneumonia) 06/15/2013  . Arthritis 05/25/2013  . Shortness of breath 04/25/2013  . Hypertension, malignant 05/03/2012  . Chronic renal insufficiency, stage III (moderate) 04/01/2012  . DM (diabetes mellitus) ([place]) 05/12/2011  . Anemia 05/12/2011  . Thyroid disease 05/12/2011  . Breast cancer of lower-outer quadrant of left female breast ([place]) 05/12/2011  . Neuropathy ([place]) 05/12/2011  . SBO (small bowel obstruction) ([place]) 05/12/2011  . Hypokalemia 05/12/2011  . Dehydration 05/12/2011  . Leukocytosis 05/12/2011    Eliab Closson, PT 03/14/2015, 1:08 PM  [facility] [address] [place], [place], [address] Phone: [phone]   Fax:  [phone]  Name: Amanda Cunningham MRN: [id number] Date of Birth: 1919/08/19

## 2015-03-16 ENCOUNTER — Ambulatory Visit: Payer: Medicare Other | Admitting: Physical Therapy

## 2015-03-19 ENCOUNTER — Other Ambulatory Visit: Payer: Self-pay | Admitting: Family Medicine

## 2015-03-20 ENCOUNTER — Ambulatory Visit: Payer: Medicare Other

## 2015-03-20 DIAGNOSIS — H1859 Other hereditary corneal dystrophies: Secondary | ICD-10-CM | POA: Diagnosis not present

## 2015-03-20 DIAGNOSIS — Z961 Presence of intraocular lens: Secondary | ICD-10-CM | POA: Diagnosis not present

## 2015-03-20 DIAGNOSIS — E113299 Type 2 diabetes mellitus with mild nonproliferative diabetic retinopathy without macular edema, unspecified eye: Secondary | ICD-10-CM | POA: Diagnosis not present

## 2015-03-20 DIAGNOSIS — H353112 Nonexudative age-related macular degeneration, right eye, intermediate dry stage: Secondary | ICD-10-CM | POA: Diagnosis not present

## 2015-03-20 DIAGNOSIS — Z794 Long term (current) use of insulin: Secondary | ICD-10-CM | POA: Diagnosis not present

## 2015-03-20 DIAGNOSIS — H353222 Exudative age-related macular degeneration, left eye, with inactive choroidal neovascularization: Secondary | ICD-10-CM | POA: Diagnosis not present

## 2015-03-22 ENCOUNTER — Encounter: Payer: Medicare Other | Admitting: Physical Therapy

## 2015-03-23 ENCOUNTER — Encounter: Payer: Self-pay | Admitting: Physical Therapy

## 2015-03-23 ENCOUNTER — Ambulatory Visit: Payer: Medicare Other | Admitting: Physical Therapy

## 2015-03-23 DIAGNOSIS — R531 Weakness: Secondary | ICD-10-CM

## 2015-03-23 DIAGNOSIS — E1151 Type 2 diabetes mellitus with diabetic peripheral angiopathy without gangrene: Secondary | ICD-10-CM | POA: Diagnosis not present

## 2015-03-23 DIAGNOSIS — R262 Difficulty in walking, not elsewhere classified: Secondary | ICD-10-CM | POA: Diagnosis not present

## 2015-03-23 DIAGNOSIS — R269 Unspecified abnormalities of gait and mobility: Secondary | ICD-10-CM

## 2015-03-23 DIAGNOSIS — L603 Nail dystrophy: Secondary | ICD-10-CM | POA: Diagnosis not present

## 2015-03-23 DIAGNOSIS — I739 Peripheral vascular disease, unspecified: Secondary | ICD-10-CM | POA: Diagnosis not present

## 2015-03-23 NOTE — Therapy (Signed)
[facility] [address] [place], [place], [address] Phone: [phone]   Fax:  [phone]  Physical Therapy Treatment  Patient Details  Name: Amanda Cunningham MRN: [id number] Date of Birth: 03/17/1919 Referring Provider: Stevie Kern, MD  Encounter Date: 03/23/2015      PT End of Session - 03/23/15 1043    Visit Number 3   Number of Visits 10   Date for PT Re-Evaluation 05/01/15   PT Start Time [id number]  pt arrived late   PT Stop Time 1104   PT Time Calculation (min) 35 min   Activity Tolerance Patient limited by fatigue   Behavior During Therapy [facility] for tasks assessed/performed      Past Medical History  Diagnosis Date  . Anemia   . Glaucoma   . Diabetes mellitus   . Hypertension   . Thyroid disease   . Arthritis   . Cancer ([place])     breast  . Chronic kidney disease   . PAD (peripheral artery disease) ([place])   . Neuropathy ([place])   . Diabetes mellitus without complication ([place])   . Cancer ([place])   . Breast CA [facility])     Past Surgical History  Procedure Laterality Date  . Total hip arthroplasty      left  . Replacement total knee      right  . Eye surgery    . Cholecystectomy    . Breast lumpectomy      left  . Ureteral stent placement      There were no vitals filed for this visit.  Visit Diagnosis:  Abnormality of gait  Difficulty walking  Weakness generalized      Subjective Assessment - 03/23/15 1034    Subjective Pt reports feels tired due to getting up having a shower and rushing out of the house to come to PT.   Pertinent History active breast cancer- choosing not to treat this at this time   Limitations Standing;Walking   How long can you stand comfortably? 10 minutes   How long can you walk comfortably? 10 minutes   Patient Stated Goals improve balance, improve endurance   Currently in Pain? No/denies   Multiple Pain Sites No                         [facility]  Adult PT Treatment/Exercise - 03/23/15 0001    Exercises   Exercises Knee/Hip;Lumbar;Ankle   Lumbar Exercises: Seated   Other Seated Lumbar Exercises shoulder flexion to 90 degrees 2x10   Knee/Hip Exercises: Aerobic   Nustep Level 1 x6 minutes, S7, arms 9   Knee/Hip Exercises: Standing   Hip ADduction Strengthening;Both;2 sets;10 reps  with B UE support and one sitting restbreak b/w sets   Knee/Hip Exercises: Seated   Long Arc Quad Strengthening;Both;2 sets;10 reps;Weights  2# added   Marching Strengthening;Both;2 sets;10 reps;Weights  2# added   Abduction/Adduction  Strengthening;Both;2 sets;10 reps   Abd/Adduction Limitations red band with abduction, ball squeeze with adduction   Ankle Exercises: Seated   Heel Raises 20 reps   Toe Raise 20 reps   Other Seated Ankle Exercises rockerboard x 3 minutes                  PT Short Term Goals - 03/14/15 1250    PT SHORT TERM GOAL #1   Title be independent in initial HEP   Time 4   Period Weeks  Status On-going   PT SHORT TERM GOAL #2   Title perform TUG in < or = to 20 seconds to imrpove safety   Time 4   Period Weeks   Status On-going  just tested last session           PT Long Term Goals - 03/06/15 1529    PT LONG TERM GOAL #1   Title be independent in advanced HEP   Time 8   Period Weeks   Status New   PT LONG TERM GOAL #2   Title perform TUG in < or = to 17 seconds to imrpove safety   Time 8   Period Weeks   Status New   PT LONG TERM GOAL #3   Title improve LE strength to perform sit to stand with moderate UE with mod-->max UE support with use of arm rests   Time 8   Period Weeks   Status New   PT LONG TERM GOAL #4   Title improve endurance to tolerate standing for 15 minutes without need for rest while cooking   Time 8   Period Weeks   Status New               Plan - 03/23/15 1045    Clinical Impression Statement Pt able to perform exercises, but fatique with all activities. Pt with  unsteady gait on level surface using cane and needs assist for safety. Pt was limited to 7x sit to stand, with both ands assist by PTA for guidance. Pt will continue to benefit from skilled PT for LE strength, endurance and gait.    Pt will benefit from skilled therapeutic intervention in order to improve on the following deficits Abnormal gait;Difficulty walking;Decreased strength;Decreased balance;Decreased activity tolerance;Decreased safety awareness   Rehab Potential Good   PT Frequency 2x / week   PT Duration 8 weeks   PT Treatment/Interventions ADLs/Self Care Home Management;Cryotherapy;Moist Heat;Therapeutic exercise;Therapeutic activities;Stair training;Gait training;Balance training;Neuromuscular re-education;Patient/family education;Manual techniques;Passive range of motion   PT Next Visit Plan Gait, balance, LE strength training.     Consulted and Agree with Plan of Care Patient        Problem List Patient Active Problem List   Diagnosis Date Noted  . History of total knee replacement 02/26/2015  . CKD (chronic kidney disease), stage III 05/03/2014  . Chronic diastolic heart failure ([place]) 05/03/2014  . DM (diabetes mellitus) ([place]) 05/03/2014  . Diabetes mellitus ([place]) 05/02/2014  . SBO (small bowel obstruction) ([place]) 05/02/2014  . Chronic diastolic congestive heart failure ([place]) 07/11/2013  . CAP (community acquired pneumonia) 06/15/2013  . Arthritis 05/25/2013  . Shortness of breath 04/25/2013  . Hypertension, malignant 05/03/2012  . Chronic renal insufficiency, stage III (moderate) 04/01/2012  . DM (diabetes mellitus) ([place]) 05/12/2011  . Anemia 05/12/2011  . Thyroid disease 05/12/2011  . Breast cancer of lower-outer quadrant of left female breast ([place]) 05/12/2011  . Neuropathy ([place]) 05/12/2011  . SBO (small bowel obstruction) ([place]) 05/12/2011  . Hypokalemia 05/12/2011  . Dehydration 05/12/2011  . Leukocytosis 05/12/2011    NAUMANN-HOUEGNIFIO,Lavine Hargrove PTA 03/23/2015,  11:04 AM  [facility] [address], [place], [place], [address] Phone: [phone]   Fax:  [phone]  Name: ANIKAH MOWELL MRN: [id number] Date of Birth: 07-23-1919

## 2015-03-26 ENCOUNTER — Ambulatory Visit (INDEPENDENT_AMBULATORY_CARE_PROVIDER_SITE_OTHER): Payer: Medicare Other | Admitting: Family Medicine

## 2015-03-26 ENCOUNTER — Encounter: Payer: Self-pay | Admitting: Family Medicine

## 2015-03-26 ENCOUNTER — Ambulatory Visit: Payer: Medicare Other

## 2015-03-26 VITALS — BP 140/60 | HR 82 | Temp 98.3°F | Wt 183.0 lb

## 2015-03-26 DIAGNOSIS — B9789 Other viral agents as the cause of diseases classified elsewhere: Principal | ICD-10-CM

## 2015-03-26 DIAGNOSIS — J069 Acute upper respiratory infection, unspecified: Secondary | ICD-10-CM | POA: Diagnosis not present

## 2015-03-26 MED ORDER — HYDROCODONE-HOMATROPINE 5-1.5 MG/5ML PO SYRP: 5.0000 mL | ORAL_SOLUTION | Freq: Three times a day (TID) | ORAL | Status: DC | PRN

## 2015-03-26 NOTE — Progress Notes (Signed)
   Subjective:    Patient ID: [name], female    DOB: Sep 06, 1919, 82 y.o.   MRN: [id number]  HPI Amanda Cunningham is an 82 year old married female nonsmoker who comes in today for evaluation of a cough for one week  About a week 10 days ago she developed cold symptoms with head congestion runny nose and cough her symptoms have persisted. She has no fever nausea vomiting. She has had some loose bowel movements.   Review of Systems Review of systems otherwise negative except her husband now has her cold    Objective:   Physical Exam  Well-developed well-nourished female no acute distress vital signs stable she's afebrile HEENT were negative neck was supple no adenopathy lungs are clear      Assessment & Plan:  Viral syndrome treat symptomatically with Tylenol liquids and cough syrup

## 2015-03-26 NOTE — Progress Notes (Signed)
Pre visit review using our clinic review tool, if applicable. No additional management support is needed unless otherwise documented below in the visit note. 

## 2015-03-26 NOTE — Patient Instructions (Signed)
Drink lots of liquids  Tylenol........... 2 tabs 3 times daily as needed  Hydromet..........Marland Kitchen 1/2-1 teaspoon 3 times daily when necessary for cough and cold  Return when necessary

## 2015-03-28 ENCOUNTER — Ambulatory Visit: Payer: Medicare Other

## 2015-03-28 DIAGNOSIS — R531 Weakness: Secondary | ICD-10-CM | POA: Diagnosis not present

## 2015-03-28 DIAGNOSIS — R269 Unspecified abnormalities of gait and mobility: Secondary | ICD-10-CM | POA: Diagnosis not present

## 2015-03-28 DIAGNOSIS — R262 Difficulty in walking, not elsewhere classified: Secondary | ICD-10-CM

## 2015-03-28 NOTE — Therapy (Signed)
[facility] [address] [place], [place], [address] Phone: [phone]   Fax:  [phone]  Physical Therapy Treatment  Patient Details  Name: Amanda Cunningham MRN: [id number] Date of Birth: 1919-07-08 Referring Provider: Stevie Kern, MD  Encounter Date: 03/28/2015      PT End of Session - 03/28/15 1304    Visit Number 4   Number of Visits 10   Date for PT Re-Evaluation 05/01/15   PT Start Time 1233   PT Stop Time 1305   PT Time Calculation (min) 32 min   Activity Tolerance Patient limited by fatigue   Behavior During Therapy [facility] for tasks assessed/performed      Past Medical History  Diagnosis Date  . Anemia   . Glaucoma   . Diabetes mellitus   . Hypertension   . Thyroid disease   . Arthritis   . Cancer ([place])     breast  . Chronic kidney disease   . PAD (peripheral artery disease) ([place])   . Neuropathy ([place])   . Diabetes mellitus without complication ([place])   . Cancer ([place])   . Breast CA [facility])     Past Surgical History  Procedure Laterality Date  . Total hip arthroplasty      left  . Replacement total knee      right  . Eye surgery    . Cholecystectomy    . Breast lumpectomy      left  . Ureteral stent placement      There were no vitals filed for this visit.  Visit Diagnosis:  Abnormality of gait  Difficulty walking  Weakness generalized      Subjective Assessment - 03/28/15 1235    Subjective Pt had a virus this week and reports that she is feeling very weakn and fatigued/.   Currently in Pain? No/denies            [facility] PT Assessment - 03/28/15 0001    Strength   Right Ankle Dorsiflexion 4/5   Left Ankle Dorsiflexion 4-/5   Timed Up and Go Test   TUG Normal TUG   Normal TUG (seconds) 23   TUG Comments use of cane                     [facility] Adult PT Treatment/Exercise - 03/28/15 0001    Lumbar Exercises: Seated   Other Seated Lumbar Exercises shoulder  flexion to 90 degrees 2x10   Knee/Hip Exercises: Aerobic   Nustep Level 1 x6 minutes, S7, arms 9   Knee/Hip Exercises: Seated   Long Arc Quad Strengthening;Both;2 sets;10 reps;Weights  2# added   Marching Strengthening;Both;2 sets;10 reps;Weights  2# added   Abduction/Adduction  Strengthening;Both;2 sets;10 reps   Abd/Adduction Limitations red band with abduction, ball squeeze with adduction   Ankle Exercises: Seated   Heel Raises 20 reps   Toe Raise 20 reps   Other Seated Ankle Exercises rockerboard x 3 minutes                  PT Short Term Goals - 03/28/15 1240    PT SHORT TERM GOAL #1   Title be independent in initial HEP   Status Achieved   PT SHORT TERM GOAL #2   Title perform TUG in < or = to 20 seconds to imrpove safety   Time 4   Period Weeks   Status On-going  23 seconds   PT SHORT TERM GOAL #3  Title demonstrate 4/5 bil. DF strength to improve safety with gait   Time 4   Period Weeks   Status On-going           PT Long Term Goals - 03/06/15 1529    PT LONG TERM GOAL #1   Title be independent in advanced HEP   Time 8   Period Weeks   Status New   PT LONG TERM GOAL #2   Title perform TUG in < or = to 17 seconds to imrpove safety   Time 8   Period Weeks   Status New   PT LONG TERM GOAL #3   Title improve LE strength to perform sit to stand with moderate UE with mod-->max UE support with use of arm rests   Time 8   Period Weeks   Status New   PT LONG TERM GOAL #4   Title improve endurance to tolerate standing for 15 minutes without need for rest while cooking   Time 8   Period Weeks   Status New               Plan - 03/28/15 1244    Clinical Impression Statement Pt with limited attendance this week due to illness.  Pt with increased fatigue today due to weakness associated with being sick this week.  Pt with continued LE/UE strength and endurance deficits of a chronic nature.  Pt will continue to benefit from skilled PT for  endurance and strength progression.    Pt will benefit from skilled therapeutic intervention in order to improve on the following deficits Abnormal gait;Difficulty walking;Decreased strength;Decreased balance;Decreased activity tolerance;Decreased safety awareness   PT Frequency 2x / week   PT Duration 8 weeks   PT Treatment/Interventions ADLs/Self Care Home Management;Cryotherapy;Moist Heat;Therapeutic exercise;Therapeutic activities;Stair training;Gait training;Balance training;Neuromuscular re-education;Patient/family education;Manual techniques;Passive range of motion   PT Next Visit Plan Gait, balance, LE strength training.     Consulted and Agree with Plan of Care Patient        Problem List Patient Active Problem List   Diagnosis Date Noted  . Viral URI with cough 03/26/2015  . History of total knee replacement 02/26/2015  . CKD (chronic kidney disease), stage III 05/03/2014  . Chronic diastolic heart failure ([place]) 05/03/2014  . DM (diabetes mellitus) ([place]) 05/03/2014  . Diabetes mellitus ([place]) 05/02/2014  . SBO (small bowel obstruction) ([place]) 05/02/2014  . Chronic diastolic congestive heart failure ([place]) 07/11/2013  . CAP (community acquired pneumonia) 06/15/2013  . Arthritis 05/25/2013  . Shortness of breath 04/25/2013  . Hypertension, malignant 05/03/2012  . Chronic renal insufficiency, stage III (moderate) 04/01/2012  . DM (diabetes mellitus) ([place]) 05/12/2011  . Anemia 05/12/2011  . Thyroid disease 05/12/2011  . Breast cancer of lower-outer quadrant of left female breast ([place]) 05/12/2011  . Neuropathy ([place]) 05/12/2011  . SBO (small bowel obstruction) ([place]) 05/12/2011  . Hypokalemia 05/12/2011  . Dehydration 05/12/2011  . Leukocytosis 05/12/2011    Shaniquia Brafford, PT 03/28/2015, 1:06 PM  [facility] [address], [place], [place], [address] Phone: [phone]   Fax:  [phone]  Name: Amanda Cunningham MRN: [id number] Date of Birth: 09-23-1919

## 2015-04-02 ENCOUNTER — Ambulatory Visit: Payer: Medicare Other | Admitting: Physical Therapy

## 2015-04-03 ENCOUNTER — Encounter: Payer: Medicare Other | Admitting: Physical Therapy

## 2015-04-04 ENCOUNTER — Encounter: Payer: Medicare Other | Admitting: Physical Therapy

## 2015-04-05 ENCOUNTER — Ambulatory Visit: Payer: Medicare Other | Admitting: Physical Therapy

## 2015-04-05 ENCOUNTER — Encounter: Payer: Self-pay | Admitting: Physical Therapy

## 2015-04-05 DIAGNOSIS — R531 Weakness: Secondary | ICD-10-CM | POA: Diagnosis not present

## 2015-04-05 DIAGNOSIS — R262 Difficulty in walking, not elsewhere classified: Secondary | ICD-10-CM

## 2015-04-05 DIAGNOSIS — R269 Unspecified abnormalities of gait and mobility: Secondary | ICD-10-CM

## 2015-04-05 NOTE — Therapy (Addendum)
[facility] [address] [place], [place], [address] Phone: [phone]   Fax:  [phone]  Physical Therapy Treatment  Patient Details  Name: Amanda Cunningham MRN: [id number] Date of Birth: 1919/08/22 Referring Provider: Stevie Kern, MD  Encounter Date: 04/05/2015      PT End of Session - 04/05/15 1123    Visit Number 5   Number of Visits 10   Date for PT Re-Evaluation 05/01/15   PT Start Time 1109   PT Stop Time 1150   PT Time Calculation (min) 41 min   Activity Tolerance Patient limited by fatigue   Behavior During Therapy [facility] for tasks assessed/performed      Past Medical History  Diagnosis Date  . Anemia   . Glaucoma   . Diabetes mellitus   . Hypertension   . Thyroid disease   . Arthritis   . Cancer ([place])     breast  . Chronic kidney disease   . PAD (peripheral artery disease) ([place])   . Neuropathy ([place])   . Diabetes mellitus without complication ([place])   . Cancer ([place])   . Breast CA [facility])     Past Surgical History  Procedure Laterality Date  . Total hip arthroplasty      left  . Replacement total knee      right  . Eye surgery    . Cholecystectomy    . Breast lumpectomy      left  . Ureteral stent placement      There were no vitals filed for this visit.  Visit Diagnosis:  Abnormality of gait  Difficulty walking  Weakness generalized      Subjective Assessment - 04/05/15 1116    Subjective Pt is recovering from her sickness last week and she is still feeling very weak and fatigued   Pertinent History active breast cancer- choosing not to treat this at this time   Limitations Standing;Walking   How long can you stand comfortably? 10 minutes   How long can you walk comfortably? 10 minutes   Diagnostic tests none   Patient Stated Goals improve balance, improve endurance   Currently in Pain? No/denies   Multiple Pain Sites No                         [facility]  Adult PT Treatment/Exercise - 04/05/15 0001    Exercises   Exercises Knee/Hip;Lumbar;Ankle   Lumbar Exercises: Seated   Other Seated Lumbar Exercises shoulder flexion to 90 degrees 2x10   Knee/Hip Exercises: Aerobic   Nustep Level 1 x6 minutes, S7, arms 9   Knee/Hip Exercises: Standing   Hip ADduction Strengthening;Both;2 sets;10 reps  with B UE support, no sitting restbreak needed   Other Standing Knee Exercises sit to stand 2 x 5 with B UE support  at treatmill   Knee/Hip Exercises: Seated   Long Arc Quad Strengthening;Both;2 sets;10 reps;Weights  2#added   Marching Strengthening;Both;2 sets;10 reps;Weights  23 added   Abduction/Adduction  Strengthening;Both;2 sets;10 reps   Abd/Adduction Limitations red band with abduction, ball squeeze with adduction   Ankle Exercises: Seated   Heel Raises 20 reps   Toe Raise 20 reps   Other Seated Ankle Exercises rockerboard x 3 minutes                  PT Short Term Goals - 03/28/15 1240    PT SHORT TERM GOAL #1   Title be independent  in initial HEP   Status Achieved   PT SHORT TERM GOAL #2   Title perform TUG in < or = to 20 seconds to imrpove safety   Time 4   Period Weeks   Status On-going  23 seconds   PT SHORT TERM GOAL #3   Title demonstrate 4/5 bil. DF strength to improve safety with gait   Time 4   Period Weeks   Status On-going           PT Long Term Goals - 03/06/15 1529    PT LONG TERM GOAL #1   Title be independent in advanced HEP   Time 8   Period Weeks   Status New   PT LONG TERM GOAL #2   Title perform TUG in < or = to 17 seconds to imrpove safety   Time 8   Period Weeks   Status New   PT LONG TERM GOAL #3   Title improve LE strength to perform sit to stand with moderate UE with mod-->max UE support with use of arm rests   Time 8   Period Weeks   Status New   PT LONG TERM GOAL #4   Title improve endurance to tolerate standing for 15 minutes without need for rest while cooking   Time 8    Period Weeks   Status New               Plan - 28-Apr-2015 1125    Clinical Impression Statement Patient with limited endurance, slow pace with all activities, however, able to perofrm exercises. Pt with continued LE?UE strength and endurance deficits of a chronic nature. Pt will continue to benefit from skilled PT for endruancde and strength progression   Pt will benefit from skilled therapeutic intervention in order to improve on the following deficits Abnormal gait;Difficulty walking;Decreased strength;Decreased balance;Decreased activity tolerance;Decreased safety awareness   Rehab Potential Good   PT Frequency 2x / week   PT Duration 8 weeks   PT Treatment/Interventions ADLs/Self Care Home Management;Cryotherapy;Moist Heat;Therapeutic exercise;Therapeutic activities;Stair training;Gait training;Balance training;Neuromuscular re-education;Patient/family education;Manual techniques;Passive range of motion   PT Next Visit Plan Gait, balance, LE strength training.     Consulted and Agree with Plan of Care Patient        Problem List Patient Active Problem List   Diagnosis Date Noted  . Viral URI with cough 03/26/2015  . History of total knee replacement 02/26/2015  . CKD (chronic kidney disease), stage III 05/03/2014  . Chronic diastolic heart failure ([place]) 05/03/2014  . DM (diabetes mellitus) ([place]) 05/03/2014  . Diabetes mellitus ([place]) 05/02/2014  . SBO (small bowel obstruction) ([place]) 05/02/2014  . Chronic diastolic congestive heart failure ([place]) 07/11/2013  . CAP (community acquired pneumonia) 06/15/2013  . Arthritis 05/25/2013  . Shortness of breath 04/25/2013  . Hypertension, malignant 05/03/2012  . Chronic renal insufficiency, stage III (moderate) 04/01/2012  . DM (diabetes mellitus) ([place]) 05/12/2011  . Anemia 05/12/2011  . Thyroid disease 05/12/2011  . Breast cancer of lower-outer quadrant of left female breast ([place]) 05/12/2011  . Neuropathy ([place]) 05/12/2011  . SBO  (small bowel obstruction) ([place]) 05/12/2011  . Hypokalemia 05/12/2011  . Dehydration 05/12/2011  . Leukocytosis 05/12/2011    NAUMANN-HOUEGNIFIO,Janeil Schexnayder PTA 04-28-15, 1:12 PM G-codes:  Mobility Category  Goal status: CL D/C status: CL PHYSICAL THERAPY DISCHARGE SUMMARY  Visits from Start of Care: 5  Current functional level related to goals / functional outcomes: Pt attended 5 PT visit and requested D/C due to  LBP limiting her PT progress.     Remaining deficits: See above for most current status.     Education / Equipment: HEP Plan: Patient agrees to discharge.  Patient goals were not met. Patient is being discharged due to meeting the stated rehab goals.  ?????     [facility] [address], [place], [place], [address] Phone: [phone]   Fax:  [phone]  Name: Amanda Cunningham MRN: [id number] Date of Birth: 08-22-1919

## 2015-04-09 ENCOUNTER — Ambulatory Visit: Payer: Medicare Other

## 2015-04-10 ENCOUNTER — Ambulatory Visit: Payer: Medicare Other | Admitting: Family Medicine

## 2015-04-10 DIAGNOSIS — N2581 Secondary hyperparathyroidism of renal origin: Secondary | ICD-10-CM | POA: Diagnosis not present

## 2015-04-10 DIAGNOSIS — I739 Peripheral vascular disease, unspecified: Secondary | ICD-10-CM | POA: Diagnosis not present

## 2015-04-10 DIAGNOSIS — E1122 Type 2 diabetes mellitus with diabetic chronic kidney disease: Secondary | ICD-10-CM | POA: Diagnosis not present

## 2015-04-10 DIAGNOSIS — N183 Chronic kidney disease, stage 3 (moderate): Secondary | ICD-10-CM | POA: Diagnosis not present

## 2015-04-10 DIAGNOSIS — M109 Gout, unspecified: Secondary | ICD-10-CM | POA: Diagnosis not present

## 2015-04-10 DIAGNOSIS — D631 Anemia in chronic kidney disease: Secondary | ICD-10-CM | POA: Diagnosis not present

## 2015-04-10 DIAGNOSIS — I129 Hypertensive chronic kidney disease with stage 1 through stage 4 chronic kidney disease, or unspecified chronic kidney disease: Secondary | ICD-10-CM | POA: Diagnosis not present

## 2015-04-10 DIAGNOSIS — M10379 Gout due to renal impairment, unspecified ankle and foot: Secondary | ICD-10-CM | POA: Diagnosis not present

## 2015-04-10 DIAGNOSIS — D509 Iron deficiency anemia, unspecified: Secondary | ICD-10-CM | POA: Diagnosis not present

## 2015-04-11 ENCOUNTER — Ambulatory Visit: Payer: Medicare Other

## 2015-04-13 ENCOUNTER — Other Ambulatory Visit: Payer: Self-pay | Admitting: Family Medicine

## 2015-04-16 ENCOUNTER — Ambulatory Visit: Payer: Medicare Other | Admitting: Physical Therapy

## 2015-04-18 ENCOUNTER — Encounter: Payer: Medicare Other | Admitting: Physical Therapy

## 2015-04-23 ENCOUNTER — Encounter: Payer: Medicare Other | Admitting: Physical Therapy

## 2015-04-24 ENCOUNTER — Other Ambulatory Visit: Payer: Self-pay | Admitting: Family Medicine

## 2015-04-26 ENCOUNTER — Ambulatory Visit: Payer: Medicare Other

## 2015-04-28 ENCOUNTER — Other Ambulatory Visit: Payer: Self-pay | Admitting: Family Medicine

## 2015-04-30 ENCOUNTER — Telehealth: Payer: Self-pay | Admitting: Family Medicine

## 2015-04-30 ENCOUNTER — Other Ambulatory Visit: Payer: Self-pay | Admitting: Family Medicine

## 2015-04-30 NOTE — Telephone Encounter (Signed)
Pt following up on request for TRAVATAN Z 0.004 % SOLN ophthalmic solution  Pt states she has been out for 2 days and needs as soon as possible.  [facility] [place]

## 2015-05-01 DIAGNOSIS — I129 Hypertensive chronic kidney disease with stage 1 through stage 4 chronic kidney disease, or unspecified chronic kidney disease: Secondary | ICD-10-CM | POA: Diagnosis not present

## 2015-05-01 DIAGNOSIS — N184 Chronic kidney disease, stage 4 (severe): Secondary | ICD-10-CM | POA: Diagnosis not present

## 2015-05-01 MED ORDER — TRAVATAN Z 0.004 % OP SOLN: 1.0000 [drp] | Freq: Every day | OPHTHALMIC | Status: AC

## 2015-05-01 NOTE — Telephone Encounter (Signed)
Rx sent 

## 2015-05-09 ENCOUNTER — Other Ambulatory Visit: Payer: Self-pay | Admitting: Family Medicine

## 2015-05-14 ENCOUNTER — Ambulatory Visit (INDEPENDENT_AMBULATORY_CARE_PROVIDER_SITE_OTHER): Payer: Medicare Other | Admitting: Family Medicine

## 2015-05-14 ENCOUNTER — Encounter: Payer: Self-pay | Admitting: Family Medicine

## 2015-05-14 VITALS — BP 150/60 | HR 62 | Temp 97.4°F | Wt 181.9 lb

## 2015-05-14 DIAGNOSIS — J9611 Chronic respiratory failure with hypoxia: Secondary | ICD-10-CM | POA: Diagnosis not present

## 2015-05-14 DIAGNOSIS — I10 Essential (primary) hypertension: Secondary | ICD-10-CM

## 2015-05-14 DIAGNOSIS — I5032 Chronic diastolic (congestive) heart failure: Secondary | ICD-10-CM

## 2015-05-14 DIAGNOSIS — N183 Chronic kidney disease, stage 3 unspecified: Secondary | ICD-10-CM

## 2015-05-14 DIAGNOSIS — N189 Chronic kidney disease, unspecified: Secondary | ICD-10-CM

## 2015-05-14 NOTE — Progress Notes (Signed)
Pre visit review using our clinic review tool, if applicable. No additional management support is needed unless otherwise documented below in the visit note. 

## 2015-05-14 NOTE — Progress Notes (Signed)
   Subjective:    Patient ID: Amanda Cunningham, female    DOB: 1919-03-20, 82 y.o.   MRN: [id number]  HPI Amanda Cunningham is a 82 year old married female nonsmoker who comes in today for multiple issues  She has a history of chronic congestive heart failure. Her weight is stable to 181.9 pounds. Because of her worsening heart failure Dr. Donley Redder her nephrologist increased her Lasix to 40 mg daily. She felt like she was too dried out therefore decreased her Lasix back to 20 mg daily. Her weight is remaining stable. Also the increase the Lasix caused her renal function did decline by 25%. She's due to go back to see nephrology the first week in May for follow-up  She states the problem she's having today are sleep dysfunction. Fatigue. She states she wakes up 45 times a night to urinate his back to bed and can't sleep. She then states his fatigue during the day. Her daughter says she can't do anything physically she gets short of breath with minor exertion.  We send her to physical therapy a month 6 weeks ago because of problems with gait and tripping. Physical therapy didn't seem to help that much.  She also some aching in her neck. It comes and goes it decreases when she sits and lies down hurts when she walks. We've avoided NSAIDs because of her chronic renal failure. She takes 2 Tylenol daily  Vital signs stable she's afebrile pulmonary exam shows no crackles  It sounds clinically that she's D satting. Her pulse ox on room air was 96%. After 15 steps her pulse ox drops to 84 therefore indeed she is a satting which may be contributing to her worsening failure and sleep dysfunction etc. Therefore we had a long discussion about continuous oxygen and she is willing to try it   Review of Systems Review of systems otherwise negative    Objective:   Physical Exam Well-developed well-nourished female no acute distress vital signs stable she's afebrile cardiopulmonary exam normal       Assessment & Plan:    Congestive heart failure chronic with now desaturation pulse ox dropped to 84 with 15 paces.........Marland Kitchen begin low-dose 1 L continuous O2 follow-up in 2 weeks

## 2015-05-14 NOTE — Patient Instructions (Signed)
Continuous oxygen at 1 L/m  Follow-up in 2 weeks

## 2015-05-15 NOTE — Addendum Note (Signed)
Addended by: Westley Hummer B on: 05/15/2015 08:31 AM   Modules accepted: Orders

## 2015-05-15 NOTE — Progress Notes (Addendum)
   Subjective:    Patient ID: Amanda Cunningham, female    DOB: [birth year], 82 y.o.   MRN: [id number]  Back Pain      Review of Systems  Musculoskeletal: Positive for back pain.       Objective:   Physical Exam        Assessment & Plan:  96% on RA at rest  82% on RA with ambulation  88% on 1L O2 with ambulation

## 2015-05-18 ENCOUNTER — Ambulatory Visit: Payer: Medicare Other | Admitting: Hematology and Oncology

## 2015-05-18 NOTE — Assessment & Plan Note (Signed)
Relapsed left breast invasive lobular cancer 0.6 cm ER 100% PR 0% HER-2/neu negative Ki-67 13% T1 B. N0 M0 stage IA; Currently on observation without treatment  Patient has decided that she would not want to go on any antiestrogen therapy because previously it had led to depression and cloudiness in the head which cleared out once she stopped the Arimidex.  She would like to only get treated for breast cancer if it was causing her any symptoms. I believe that is completely reasonable.  We can watch and monitor her periodically and I recommended a follow-up back to see [place] in 6 months. We decided not to do any further imaging investigations and we will clinically watch and monitor her.  Normocytic anemia: I reviewed the recent blood work along with iron studies. I do not believe she has any marked iron deficiency anemia. She does have low serum iron and ferritin is normal. B12 was also normal. I suspect her anemia could be due to chronic kidney disease.  Return to clinic in 1 year

## 2015-05-29 ENCOUNTER — Encounter: Payer: Self-pay | Admitting: Family Medicine

## 2015-05-29 ENCOUNTER — Ambulatory Visit (INDEPENDENT_AMBULATORY_CARE_PROVIDER_SITE_OTHER): Payer: Medicare Other | Admitting: Family Medicine

## 2015-05-29 VITALS — BP 140/80 | HR 94 | Temp 97.8°F | Wt 182.0 lb

## 2015-05-29 DIAGNOSIS — J9611 Chronic respiratory failure with hypoxia: Secondary | ICD-10-CM | POA: Diagnosis not present

## 2015-05-29 DIAGNOSIS — I5032 Chronic diastolic (congestive) heart failure: Secondary | ICD-10-CM

## 2015-05-29 NOTE — Progress Notes (Signed)
Pre visit review using our clinic review tool, if applicable. No additional management support is needed unless otherwise documented below in the visit note. 

## 2015-05-29 NOTE — Progress Notes (Signed)
   Subjective:    Patient ID: Amanda Cunningham, female    DOB: Nov 12, 1919, 82 y.o.   MRN: [id number]  HPI Amanda Cunningham is a 82 year old married female nonsmoker who comes in today for follow-up of hypoxia  We saw her couple weeks ago with complaints of shortness of breath fatigued decreased exercise tolerance and her cardiopulmonary status really hadn't changed that much. With small amounts of exertion or auction level dropped to low 80s. We started on 1 L of continuous oxygen and she is in today for follow-up. She says she feels much better her energy levels up she is able to walk at the grocery store overall tremendous improvement on 1 L of oxygen continuously. They're going to physical therapy today to see if a smaller portable pulsatile unit would be appropriate.  We walked her around the office with her auction on and her oxygen level stayed around 95 therefore 1 L is the appropriate dose   Review of Systems    negative Objective:   Physical Exam  Well-developed well-nourished female no acute distress vital signs stable he states he is afebrile  Cardiopulmonary status unchanged      Assessment & Plan:  Hypoxia much improved with 1 L of oxygen continuously........... continue the 1 L of oxygen. Evaluated by respiratory therapy today to see if the pulsatile unit would be appropriate. Follow-up in September

## 2015-05-29 NOTE — Patient Instructions (Signed)
Continue the oxygen 1 L continuously  Follow-up in September

## 2015-06-02 ENCOUNTER — Other Ambulatory Visit: Payer: Self-pay | Admitting: Family Medicine

## 2015-06-04 ENCOUNTER — Ambulatory Visit (HOSPITAL_BASED_OUTPATIENT_CLINIC_OR_DEPARTMENT_OTHER): Payer: Medicare Other | Admitting: Hematology and Oncology

## 2015-06-04 ENCOUNTER — Telehealth: Payer: Self-pay | Admitting: Hematology and Oncology

## 2015-06-04 ENCOUNTER — Encounter: Payer: Self-pay | Admitting: Hematology and Oncology

## 2015-06-04 VITALS — BP 180/55 | HR 76 | Temp 97.6°F | Resp 18 | Ht 62.0 in | Wt 182.3 lb

## 2015-06-04 DIAGNOSIS — D649 Anemia, unspecified: Secondary | ICD-10-CM | POA: Diagnosis not present

## 2015-06-04 DIAGNOSIS — C50512 Malignant neoplasm of lower-outer quadrant of left female breast: Secondary | ICD-10-CM

## 2015-06-04 DIAGNOSIS — N189 Chronic kidney disease, unspecified: Secondary | ICD-10-CM | POA: Diagnosis not present

## 2015-06-04 NOTE — Progress Notes (Signed)
Patient Care Team: Dorena Cookey, MD as PCP - General (Family Medicine) Dorena Cookey, MD (Family Medicine)  SUMMARY OF ONCOLOGIC HISTORY:   Breast cancer of lower-outer quadrant of left female breast ([place])   10/19/2013 Initial Diagnosis Left breast biopsy: Invasive lobular cancer, ER 100%, PR 0%, Ki-67 13%, currently on observation without treatment by patient preference   05/02/2014 - 05/05/2014 Hospital Admission Hospitalization for partial small bowel obstruction treated conservatively    CHIEF COMPLIANT: Follow-up of left breast cancer.  INTERVAL HISTORY: Amanda Cunningham is a 82 year old with above-mentioned history left breast cancer currently not on any particular breast cancer therapy. She is here for a routine follow-up and reports that her energy levels and strength have all remained the same. She currently lives with her husband in her daughter's house. She also has a house in [place]. Her 3 sons live in [place] and [place]. She is still able to cook for herself and manage her own life.  REVIEW OF SYSTEMS:   Constitutional: Denies fevers, chills or abnormal weight loss Eyes: Denies blurriness of vision Ears, nose, mouth, throat, and face: Denies mucositis or sore throat Respiratory: Denies cough, dyspnea or wheezes Cardiovascular: Denies palpitation, chest discomfort Gastrointestinal:  Denies nausea, heartburn or change in bowel habits Skin: Denies abnormal skin rashes Lymphatics: Denies new lymphadenopathy or easy bruising Neurological:Denies numbness, tingling or new weaknesses Behavioral/Psych: Mood is stable, no new changes  Extremities: No lower extremity edema Breast: Patient states that she does not pay much attention to her breasts. All other systems were reviewed with the patient and are negative.  I have reviewed the past medical history, past surgical history, social history and family history with the patient and they are unchanged from previous note.  ALLERGIES:   is allergic to iodine; ciprofloxacin; codeine; iodine; sodium sulfite; and tape.  MEDICATIONS:  Current Outpatient Prescriptions  Medication Sig Dispense Refill  . acetaminophen (TYLENOL) 500 MG tablet Take 1,000 mg by mouth every 6 (six) hours as needed for moderate pain or headache.    . allopurinol (ZYLOPRIM) 100 MG tablet Take 1 tablet (100 mg total) by mouth daily. (Patient taking differently: Take 100 mg by mouth 2 (two) times daily. ) 90 tablet 3  . amLODipine (NORVASC) 10 MG tablet TAKE 1 TABLET BY MOUTH TWICE A DAY 60 tablet 5  . aspirin EC 81 MG tablet Take 81 mg by mouth daily.    . calcitRIOL (ROCALTROL) 0.25 MCG capsule     . cloNIDine (CATAPRES) 0.1 MG tablet TAKE 1 TABLET BY MOUTH 3 TIMES A DAY 90 tablet 3  . clopidogrel (PLAVIX) 75 MG tablet TAKE 1 TABLET BY MOUTH DAILY 90 tablet 2  . colchicine 0.6 MG tablet Take 0.6 mg by mouth daily.    . dorzolamide-timolol (COSOPT) 22.3-6.8 MG/ML ophthalmic solution Place 1 drop into both eyes at bedtime.    . ferrous sulfate 325 (65 FE) MG tablet Take 325 mg by mouth daily with breakfast.    . furosemide (LASIX) 20 MG tablet TAKE 1 TABLET (20 MG TOTAL) BY MOUTH DAILY. 30 tablet 3  . glimepiride (AMARYL) 2 MG tablet TAKE 1/2 TABLET BY MOUTH DAILY. MAY TAKE 1 TABLET IF BLOOD SUGAR IS HIGH 30 tablet 5  . hydrALAZINE (APRESOLINE) 50 MG tablet TAKE 1 TABLET BY MOUTH TWICE A DAY 60 tablet 4  . KLOR-CON M10 10 MEQ tablet TAKE 1 TABLET EVERY MORNING 100 tablet 2  . labetalol (NORMODYNE) 200 MG tablet TAKE 1 TABLET  BY MOUTH TWICE A DAY 60 tablet 7  . Lancets (ONETOUCH ULTRASOFT) lancets TEST ONCE DAILY AS DIRECTED 100 each 3  . Multiple Vitamins-Minerals (PRESERVISION/LUTEIN PO) Take 1 tablet by mouth 2 (two) times daily.    . ONE TOUCH ULTRA TEST test strip TEST ONCE DAILY 100 each 4  . ONE TOUCH ULTRA TEST test strip TEST ONCE DAILY 100 each 4  . pantoprazole (PROTONIX) 40 MG tablet TAKE 1 TABLET BY MOUTH EVERY DAY 90 tablet 3  . SYNTHROID 150  MCG tablet TAKE 1 TABLET BY MOUTH ONCE DAILY. 90 tablet 3  . TRAVATAN Z 0.004 % SOLN ophthalmic solution Place 1 drop into both eyes at bedtime. 5 mL 3   No current facility-administered medications for this visit.    PHYSICAL EXAMINATION: ECOG PERFORMANCE STATUS: 1 - Symptomatic but completely ambulatory  Filed Vitals:   06/04/15 0928  BP: 180/55  Pulse: 76  Temp: 97.6 F (36.4 C)  Resp: 18   Filed Weights   06/04/15 0928  Weight: 182 lb 4.8 oz (82.691 kg)    GENERAL:alert, no distress and comfortable SKIN: skin color, texture, turgor are normal, no rashes or significant lesions EYES: normal, Conjunctiva are pink and non-injected, sclera clear OROPHARYNX:no exudate, no erythema and lips, buccal mucosa, and tongue normal  NECK: supple, thyroid normal size, non-tender, without nodularity LYMPH:  no palpable lymphadenopathy in the cervical, axillary or inguinal LUNGS: clear to auscultation and percussion with normal breathing effort HEART: regular rate & rhythm and no murmurs and no lower extremity edema ABDOMEN:abdomen soft, non-tender and normal bowel sounds MUSCULOSKELETAL:no cyanosis of digits and no clubbing  NEURO: alert & oriented x 3 with fluent speech, no focal motor/sensory deficits EXTREMITIES: No lower extremity edema BREAST: I could not feel any palpable lumps or nodules in bilateral breasts. No palpable axillary supraclavicular or infraclavicular adenopathy no breast tenderness or nipple discharge. (exam performed in the presence of a chaperone)  LABORATORY DATA:  I have reviewed the data as listed   Chemistry      Component Value Date/Time   NA 143 11/21/2014 1058   K 3.9 11/21/2014 1058   CL 105 11/21/2014 1058   CO2 26 11/21/2014 1058   BUN 37* 11/21/2014 1058   CREATININE 1.78* 11/21/2014 1058   CREATININE 1.49* 12/21/2012 1139      Component Value Date/Time   CALCIUM 9.3 11/21/2014 1058   ALKPHOS 80 09/26/2014 1210   AST 14 09/26/2014 1210   ALT 8  09/26/2014 1210   BILITOT 0.4 09/26/2014 1210       Lab Results  Component Value Date   WBC 12.2* 11/21/2014   HGB 10.5* 11/21/2014   HCT 32.9* 11/21/2014   MCV 83.1 11/21/2014   PLT 317.0 11/21/2014   NEUTROABS 10.4* 11/21/2014    ASSESSMENT & PLAN:  Breast cancer of lower-outer quadrant of left female breast Relapsed left breast invasive lobular cancer 0.6 cm ER 100% PR 0% HER-2/neu negative Ki-67 13% T1 B. N0 M0 stage IA; Currently on observation without treatment  Patient had decided that she would not want to go on any antiestrogen therapy because previously it had led to depression and cloudiness in the head which cleared out once she stopped the Arimidex. She would like to only get treated for breast cancer if it was causing her any symptoms. I believe that is completely reasonable.  Normocytic anemia:Ferritin wa we did not draw any lab work today. s normal. B12 was also normal. I suspect her anemia  could be due to chronic kidney disease.   Return to clinic in 1 year for continued clinical monitoring and follow-up. I instructed the patient that if it is difficult to come to these appointments, it would be perfectly reasonable to call and cancel. Patient really wishes to see [place] once a year.  No orders of the defined types were placed in this encounter.   The patient has a good understanding of the overall plan. she agrees with it. she will call with any problems that may develop before the next visit here.   Rulon Eisenmenger, MD 06/04/2015

## 2015-06-04 NOTE — Assessment & Plan Note (Signed)
Relapsed left breast invasive lobular cancer 0.6 cm ER 100% PR 0% HER-2/neu negative Ki-67 13% T1 B. N0 M0 stage IA; Currently on observation without treatment  Patient has decided that she would not want to go on any antiestrogen therapy because previously it had led to depression and cloudiness in the head which cleared out once she stopped the Arimidex. She would like to only get treated for breast cancer if it was causing her any symptoms. I believe that is completely reasonable.  We can watch and monitor her periodically and I recommended a follow-up back to see [place] in 6 months. We decided not to do any further imaging investigations and we will clinically watch and monitor her.  Normocytic anemia:Ferritin is normal. B12 was also normal. I suspect her anemia could be due to chronic kidney disease.  Return to clinic in 1 year for continued clinical monitoring and follow-up.

## 2015-06-04 NOTE — Telephone Encounter (Signed)
appt made and avs printed °

## 2015-06-08 DIAGNOSIS — I739 Peripheral vascular disease, unspecified: Secondary | ICD-10-CM | POA: Diagnosis not present

## 2015-06-08 DIAGNOSIS — L603 Nail dystrophy: Secondary | ICD-10-CM | POA: Diagnosis not present

## 2015-06-08 DIAGNOSIS — E1151 Type 2 diabetes mellitus with diabetic peripheral angiopathy without gangrene: Secondary | ICD-10-CM | POA: Diagnosis not present

## 2015-06-09 ENCOUNTER — Other Ambulatory Visit: Payer: Self-pay | Admitting: Family Medicine

## 2015-06-10 ENCOUNTER — Other Ambulatory Visit: Payer: Self-pay | Admitting: Family Medicine

## 2015-06-13 DIAGNOSIS — N184 Chronic kidney disease, stage 4 (severe): Secondary | ICD-10-CM | POA: Diagnosis not present

## 2015-06-26 ENCOUNTER — Other Ambulatory Visit: Payer: Self-pay | Admitting: Family Medicine

## 2015-07-15 ENCOUNTER — Other Ambulatory Visit: Payer: Self-pay | Admitting: Family Medicine

## 2015-08-01 ENCOUNTER — Other Ambulatory Visit: Payer: Self-pay | Admitting: Family Medicine

## 2015-09-05 DIAGNOSIS — I739 Peripheral vascular disease, unspecified: Secondary | ICD-10-CM | POA: Diagnosis not present

## 2015-09-05 DIAGNOSIS — E1151 Type 2 diabetes mellitus with diabetic peripheral angiopathy without gangrene: Secondary | ICD-10-CM | POA: Diagnosis not present

## 2015-09-05 DIAGNOSIS — L603 Nail dystrophy: Secondary | ICD-10-CM | POA: Diagnosis not present

## 2015-09-12 ENCOUNTER — Other Ambulatory Visit: Payer: Self-pay | Admitting: Family Medicine

## 2015-09-13 ENCOUNTER — Other Ambulatory Visit: Payer: Self-pay | Admitting: Family Medicine

## 2015-09-14 ENCOUNTER — Other Ambulatory Visit: Payer: Self-pay | Admitting: General Practice

## 2015-09-14 MED ORDER — LABETALOL HCL 200 MG PO TABS: 200.0000 mg | ORAL_TABLET | Freq: Two times a day (BID) | ORAL | 3 refills | Status: DC

## 2015-09-14 MED ORDER — AMLODIPINE BESYLATE 10 MG PO TABS: 10.0000 mg | ORAL_TABLET | Freq: Two times a day (BID) | ORAL | 5 refills | Status: AC

## 2015-10-09 DIAGNOSIS — H1859 Other hereditary corneal dystrophies: Secondary | ICD-10-CM | POA: Diagnosis not present

## 2015-10-09 DIAGNOSIS — Z961 Presence of intraocular lens: Secondary | ICD-10-CM | POA: Diagnosis not present

## 2015-10-09 DIAGNOSIS — H353112 Nonexudative age-related macular degeneration, right eye, intermediate dry stage: Secondary | ICD-10-CM | POA: Diagnosis not present

## 2015-10-09 DIAGNOSIS — E113293 Type 2 diabetes mellitus with mild nonproliferative diabetic retinopathy without macular edema, bilateral: Secondary | ICD-10-CM | POA: Diagnosis not present

## 2015-10-09 DIAGNOSIS — H353222 Exudative age-related macular degeneration, left eye, with inactive choroidal neovascularization: Secondary | ICD-10-CM | POA: Diagnosis not present

## 2015-10-09 DIAGNOSIS — Z794 Long term (current) use of insulin: Secondary | ICD-10-CM | POA: Diagnosis not present

## 2015-10-11 ENCOUNTER — Other Ambulatory Visit: Payer: Self-pay | Admitting: Family Medicine

## 2015-10-25 ENCOUNTER — Other Ambulatory Visit: Payer: Self-pay | Admitting: Family Medicine

## 2015-10-25 ENCOUNTER — Other Ambulatory Visit: Payer: Self-pay | Admitting: Internal Medicine

## 2015-10-25 NOTE — Telephone Encounter (Signed)
Rx refill sent to pharmacy. 

## 2015-10-29 ENCOUNTER — Encounter: Payer: Self-pay | Admitting: Family Medicine

## 2015-10-29 ENCOUNTER — Ambulatory Visit (INDEPENDENT_AMBULATORY_CARE_PROVIDER_SITE_OTHER): Payer: Medicare Other | Admitting: Family Medicine

## 2015-10-29 VITALS — BP 162/60 | HR 71 | Temp 97.6°F | Wt 171.7 lb

## 2015-10-29 DIAGNOSIS — E1159 Type 2 diabetes mellitus with other circulatory complications: Secondary | ICD-10-CM

## 2015-10-29 DIAGNOSIS — I5032 Chronic diastolic (congestive) heart failure: Secondary | ICD-10-CM

## 2015-10-29 DIAGNOSIS — J9611 Chronic respiratory failure with hypoxia: Secondary | ICD-10-CM | POA: Diagnosis not present

## 2015-10-29 DIAGNOSIS — N183 Chronic kidney disease, stage 3 unspecified: Secondary | ICD-10-CM

## 2015-10-29 DIAGNOSIS — I10 Essential (primary) hypertension: Secondary | ICD-10-CM

## 2015-10-29 DIAGNOSIS — E079 Disorder of thyroid, unspecified: Secondary | ICD-10-CM | POA: Diagnosis not present

## 2015-10-29 DIAGNOSIS — N189 Chronic kidney disease, unspecified: Secondary | ICD-10-CM

## 2015-10-29 NOTE — Progress Notes (Signed)
Pre visit review using our clinic review tool, if applicable. No additional management support is needed unless otherwise documented below in the visit note. 

## 2015-10-29 NOTE — Patient Instructions (Signed)
Stop the colchicine and  the amaral.  Decrease the Norvasc to 10 mg once a day in the morning. Discontinue the evening dose.  Stop the iron  Labs today I will call you the report tomorrow

## 2015-10-29 NOTE — Progress Notes (Signed)
Amanda Cunningham is a 83 year old female who comes in today accompanied by her daughter Amanda Cunningham for evaluation of multiple issues  She has a history of hypertension and says her blood pressure noise 114/50. She takes all her medication. BP this afternoon at 3:15 PM is 162/60. She says she doesn't feel well her appetites down she's lost 10 pounds she has frequent loose bowel movements she sometimes confused she's not able to walk very well she doesn't sleep well at night.  She is on continuous oxygen because of profound hypoxia.  She also has 2 superficial sores on her buttocks that she's been treating with a and D ointment. She has incontinence of urine and stool. Amanda Cunningham also says she seems to be somewhat confused.  Physical exam vital signs weight 10-171 was on an 82 and may. BP 162/60 pulse is 71 irregular irregular. Examination heart shows a systolic murmur at the aortic area consistent with AF's. Lungs are clear abdominal exam is negative examination skin shows 2 superficial abrasions type on her buttocks are not really ulcerated.  Because the change in heart rhythm EKG was done  Impression  #1 multiple symptoms which include weight loss decrease appetite loose bowel movements intermittent confusion and sedation and him not sure the etiology of all these complaints.............Marland Kitchen begin workup with lab  #2 change in cardiac rhythm,,,,,,,, probable intermittent AF  #3 history of diabetes,,,,,,,, question side effect from MR  #4 loose bowel movements question clonidine

## 2015-10-30 LAB — CBC WITH DIFFERENTIAL/PLATELET
BASOS ABS: 0.1 10*3/uL (ref 0.0–0.1)
Basophils Relative: 1.1 % (ref 0.0–3.0)
EOS ABS: 0.3 10*3/uL (ref 0.0–0.7)
Eosinophils Relative: 2.4 % (ref 0.0–5.0)
HCT: 29 % — ABNORMAL LOW (ref 36.0–46.0)
Hemoglobin: 9.4 g/dL — ABNORMAL LOW (ref 12.0–15.0)
LYMPHS ABS: 1 10*3/uL (ref 0.7–4.0)
Lymphocytes Relative: 8.6 % — ABNORMAL LOW (ref 12.0–46.0)
MCHC: 32.5 g/dL (ref 30.0–36.0)
MCV: 86.9 fl (ref 78.0–100.0)
MONO ABS: 0.4 10*3/uL (ref 0.1–1.0)
Monocytes Relative: 3.9 % (ref 3.0–12.0)
NEUTROS ABS: 9.4 10*3/uL — AB (ref 1.4–7.7)
NEUTROS PCT: 84 % — AB (ref 43.0–77.0)
PLATELETS: 309 10*3/uL (ref 150.0–400.0)
RBC: 3.34 Mil/uL — ABNORMAL LOW (ref 3.87–5.11)
RDW: 16.9 % — ABNORMAL HIGH (ref 11.5–15.5)
WBC: 11.2 10*3/uL — ABNORMAL HIGH (ref 4.0–10.5)

## 2015-10-30 LAB — TSH: TSH: 4.4 u[IU]/mL (ref 0.35–4.50)

## 2015-10-30 LAB — HEPATIC FUNCTION PANEL
ALT: 9 U/L (ref 0–35)
AST: 15 U/L (ref 0–37)
Albumin: 3.2 g/dL — ABNORMAL LOW (ref 3.5–5.2)
Alkaline Phosphatase: 125 U/L — ABNORMAL HIGH (ref 39–117)
BILIRUBIN DIRECT: 0.1 mg/dL (ref 0.0–0.3)
BILIRUBIN TOTAL: 0.3 mg/dL (ref 0.2–1.2)
Total Protein: 5.9 g/dL — ABNORMAL LOW (ref 6.0–8.3)

## 2015-10-30 LAB — BASIC METABOLIC PANEL
BUN: 62 mg/dL — AB (ref 6–23)
CO2: 25 mEq/L (ref 19–32)
Calcium: 8.7 mg/dL (ref 8.4–10.5)
Chloride: 109 mEq/L (ref 96–112)
Creatinine, Ser: 2.14 mg/dL — ABNORMAL HIGH (ref 0.40–1.20)
GFR: 22.79 mL/min — AB (ref >60.00)
Glucose, Bld: 135 mg/dL — ABNORMAL HIGH (ref 70–99)
POTASSIUM: 4.3 meq/L (ref 3.5–5.1)
SODIUM: 145 meq/L (ref 135–145)

## 2015-10-30 LAB — HEMOGLOBIN A1C: HEMOGLOBIN A1C: 6 % (ref 4.6–6.5)

## 2015-11-05 ENCOUNTER — Ambulatory Visit (INDEPENDENT_AMBULATORY_CARE_PROVIDER_SITE_OTHER): Payer: Medicare Other | Admitting: Family Medicine

## 2015-11-05 ENCOUNTER — Encounter: Payer: Self-pay | Admitting: Family Medicine

## 2015-11-05 VITALS — BP 160/60 | HR 67 | Temp 97.5°F | Wt 172.0 lb

## 2015-11-05 DIAGNOSIS — I5032 Chronic diastolic (congestive) heart failure: Secondary | ICD-10-CM | POA: Diagnosis not present

## 2015-11-05 DIAGNOSIS — I83009 Varicose veins of unspecified lower extremity with ulcer of unspecified site: Secondary | ICD-10-CM

## 2015-11-05 DIAGNOSIS — L97909 Non-pressure chronic ulcer of unspecified part of unspecified lower leg with unspecified severity: Secondary | ICD-10-CM

## 2015-11-05 DIAGNOSIS — I10 Essential (primary) hypertension: Secondary | ICD-10-CM

## 2015-11-05 DIAGNOSIS — E1159 Type 2 diabetes mellitus with other circulatory complications: Secondary | ICD-10-CM

## 2015-11-05 DIAGNOSIS — D638 Anemia in other chronic diseases classified elsewhere: Secondary | ICD-10-CM | POA: Diagnosis not present

## 2015-11-05 NOTE — Progress Notes (Signed)
Amanda Cunningham is a 82 year old married female nonsmoker who comes in today for follow-up of multiple issues  We have been seen quite frequently because she has been doing well. Her blood sugar dropped and therefore we have stopped her oral hypo-glycemic and her blood sugar now seems stable in the 135 range.  We did also decrease some of her antihypertensive medicines because her heart rate was too low and her blood pressure was too low. Blood pressure today 160/60 pulse is 70 with an occasional skip.  Her CBC last week shows a hemoglobin is dropped from 12.211 months ago to 11.2. Her B12 and folate levels iron of all been normal. The etiology of anemia is renal insufficiency. At one time a year so ago she had a transfusion which really helped her energy level. She is going to see nephrology to more. I will ask them if they concur with another transfusion. Her last GFR week ago was 22.79 with a creatinine of 2.14 to be 162. Thyroid level was normal A1c was 6.0%.  Except for the weekend the loose bowel movements is seen to resolve itself. We'll also cut her allopurinol in half. We stopped the colchicine which may have been the etiology of her diarrhea.  The daughter says a lesion on her room and now is ulcerated. We'll get her set up at the wound center.  Vital signs pulse is 70 and occasional skip pulse ox on room air 94% BP 160/60.  Impression  #1 anemia secondary to renal insufficiency....... consult with nephrology tomorrow question transfusion  #2 hypertension.......... BP improved by decreasing medication  #3 loose bowel movements........ improved off colchicine decrease allopurinol to 1 tablet daily  #4 fatigue....... question secondary to anemia again transfusion??  #5 diabetes........ at goal off medication continue to hold dedication  #6 stasis ulcers buttocks 2......Marland Kitchen referred to the wound center

## 2015-11-05 NOTE — Progress Notes (Signed)
Pre visit review using our clinic review tool, if applicable. No additional management support is needed unless otherwise documented below in the visit note. 

## 2015-11-05 NOTE — Patient Instructions (Signed)
Decrease the allopurinol........... only take 1 tablet daily  When you see the nephrologist tomorrow asked them if they concur with a blood transfusion  We will set you up a consult in the wound center to evaluate the areas on your buttocks of skin breakdown  Return in 2 weeks to see me for follow-up

## 2015-11-06 ENCOUNTER — Telehealth: Payer: Self-pay | Admitting: Family Medicine

## 2015-11-06 ENCOUNTER — Telehealth: Payer: Self-pay | Admitting: Emergency Medicine

## 2015-11-06 NOTE — Telephone Encounter (Signed)
Receive Verbal Order from Dr. Sherren Mocha to call and get an appointment scheduled for pt to receive a blood transfusion.   Called and received paperwork from [facility] stating that it should be completed and returned for appointment scheduled for Oct 9th @ 9:00am.   Per Dr.Todd: benefits, risks, side effects, alternatives, likelihood of reaction all discussed. Patient's daugther (confirmed on DPR) verbalized understanding.   Paper worked faxed at 3:23pm to [phone]  Received fax confirmation sheet at 3:30 pm

## 2015-11-06 NOTE — Telephone Encounter (Signed)
° ° °  Pt daughter call to say that Dr Sherren Mocha said pt need a blood transfusion and called to ask if it can be scheduled 11/19/15   [phone]

## 2015-11-07 NOTE — Telephone Encounter (Signed)
Per Dr.Todd appointment is scheduled for October 9th at 9:00am at the [facility] center. Pt and daughter are aware and verbalized understanding.

## 2015-11-08 DIAGNOSIS — I129 Hypertensive chronic kidney disease with stage 1 through stage 4 chronic kidney disease, or unspecified chronic kidney disease: Secondary | ICD-10-CM | POA: Diagnosis not present

## 2015-11-08 DIAGNOSIS — N2581 Secondary hyperparathyroidism of renal origin: Secondary | ICD-10-CM | POA: Diagnosis not present

## 2015-11-08 DIAGNOSIS — D631 Anemia in chronic kidney disease: Secondary | ICD-10-CM | POA: Diagnosis not present

## 2015-11-08 DIAGNOSIS — E1122 Type 2 diabetes mellitus with diabetic chronic kidney disease: Secondary | ICD-10-CM | POA: Diagnosis not present

## 2015-11-08 DIAGNOSIS — N184 Chronic kidney disease, stage 4 (severe): Secondary | ICD-10-CM | POA: Diagnosis not present

## 2015-11-08 DIAGNOSIS — E039 Hypothyroidism, unspecified: Secondary | ICD-10-CM | POA: Diagnosis not present

## 2015-11-08 DIAGNOSIS — M10379 Gout due to renal impairment, unspecified ankle and foot: Secondary | ICD-10-CM | POA: Diagnosis not present

## 2015-11-08 DIAGNOSIS — Z6835 Body mass index (BMI) 35.0-35.9, adult: Secondary | ICD-10-CM | POA: Diagnosis not present

## 2015-11-15 DIAGNOSIS — N2581 Secondary hyperparathyroidism of renal origin: Secondary | ICD-10-CM | POA: Diagnosis not present

## 2015-11-15 DIAGNOSIS — I129 Hypertensive chronic kidney disease with stage 1 through stage 4 chronic kidney disease, or unspecified chronic kidney disease: Secondary | ICD-10-CM | POA: Diagnosis not present

## 2015-11-15 DIAGNOSIS — N184 Chronic kidney disease, stage 4 (severe): Secondary | ICD-10-CM | POA: Diagnosis not present

## 2015-11-19 ENCOUNTER — Other Ambulatory Visit: Payer: Self-pay | Admitting: Family Medicine

## 2015-11-19 ENCOUNTER — Ambulatory Visit (HOSPITAL_COMMUNITY)
Admission: RE | Admit: 2015-11-19 | Discharge: 2015-11-19 | Disposition: A | Discharge status: Home / Self Care | Payer: Medicare Other | Source: Ambulatory Visit | Attending: Family Medicine | Admitting: Family Medicine

## 2015-11-19 DIAGNOSIS — D649 Anemia, unspecified: Secondary | ICD-10-CM | POA: Diagnosis not present

## 2015-11-19 DIAGNOSIS — N289 Disorder of kidney and ureter, unspecified: Secondary | ICD-10-CM | POA: Insufficient documentation

## 2015-11-19 LAB — PREPARE RBC (CROSSMATCH)

## 2015-11-19 LAB — ABO/RH: ABO/RH(D): A POS

## 2015-11-19 MED ORDER — SODIUM CHLORIDE 0.9 % IV SOLN
Freq: Once | INTRAVENOUS | Status: AC
  Administered 2015-11-19: 11:00:00 via INTRAVENOUS

## 2015-11-19 NOTE — Progress Notes (Signed)
Patient ID: Amanda Cunningham, female   DOB: [birth year], 82 y.o.   MRN: [id number]  Provider:Jeffery Todd MD Associated Diagnosis: Anemia secondary to Renal Insufficiency  Procedure: Transfused 1 units of PRBC's   Patient tolerated well. Patient being discharged per MD orders. Pt states and understanding of discharge instructions with no questions. Patient rolled down in wheelchair by patient's daughter.

## 2015-11-19 NOTE — Discharge Instructions (Signed)
Blood Transfusion, Care After  These instructions give you information about caring for yourself after your procedure. Your doctor may also give you more specific instructions. Call your doctor if you have any problems or questions after your procedure.   HOME CARE   Take medicines only as told by your doctor. Ask your doctor if you can take an over-the-counter pain reliever if you have a fever or headache a day or two after your procedure.   Return to your normal activities as told by your doctor.  GET HELP IF:    You develop redness or irritation at your IV site.   You have a fever, chills, or a headache that does not go away.   Your pee (urine) is darker than normal.   Your urine turns:    Pink.    Red.    Brown.   The white part of your eye turns yellow (jaundice).   You feel weak after doing your normal activities.  GET HELP RIGHT AWAY IF:    You have trouble breathing.   You have fever and chills and you also have:    Anxiety.    Chest or back pain.    Flushed or pink skin.    Clammy or sweaty skin.    A fast heartbeat.    A sick feeling in your stomach (nausea).     This information is not intended to replace advice given to you by your health care provider. Make sure you discuss any questions you have with your health care provider.     Document Released: 02/17/2014 Document Reviewed: 02/17/2014  Elsevier Interactive Patient Education 2016 [employer]

## 2015-11-20 ENCOUNTER — Encounter (HOSPITAL_BASED_OUTPATIENT_CLINIC_OR_DEPARTMENT_OTHER): Payer: Medicare Other | Attending: Internal Medicine

## 2015-11-20 ENCOUNTER — Ambulatory Visit: Payer: Medicare Other | Admitting: Family Medicine

## 2015-11-20 DIAGNOSIS — N186 End stage renal disease: Secondary | ICD-10-CM | POA: Diagnosis not present

## 2015-11-20 DIAGNOSIS — I509 Heart failure, unspecified: Secondary | ICD-10-CM | POA: Insufficient documentation

## 2015-11-20 DIAGNOSIS — E1122 Type 2 diabetes mellitus with diabetic chronic kidney disease: Secondary | ICD-10-CM | POA: Insufficient documentation

## 2015-11-20 DIAGNOSIS — Z87891 Personal history of nicotine dependence: Secondary | ICD-10-CM | POA: Diagnosis not present

## 2015-11-20 DIAGNOSIS — L89312 Pressure ulcer of right buttock, stage 2: Secondary | ICD-10-CM | POA: Diagnosis not present

## 2015-11-20 DIAGNOSIS — M7062 Trochanteric bursitis, left hip: Secondary | ICD-10-CM | POA: Diagnosis not present

## 2015-11-20 DIAGNOSIS — L89321 Pressure ulcer of left buttock, stage 1: Secondary | ICD-10-CM | POA: Insufficient documentation

## 2015-11-20 DIAGNOSIS — E1136 Type 2 diabetes mellitus with diabetic cataract: Secondary | ICD-10-CM | POA: Diagnosis not present

## 2015-11-20 DIAGNOSIS — I132 Hypertensive heart and chronic kidney disease with heart failure and with stage 5 chronic kidney disease, or end stage renal disease: Secondary | ICD-10-CM | POA: Diagnosis not present

## 2015-11-20 DIAGNOSIS — Z9981 Dependence on supplemental oxygen: Secondary | ICD-10-CM | POA: Insufficient documentation

## 2015-11-20 LAB — TYPE AND SCREEN
ABO/RH(D): A POS
ANTIBODY SCREEN: NEGATIVE
UNIT DIVISION: 0

## 2015-11-22 ENCOUNTER — Encounter (HOSPITAL_BASED_OUTPATIENT_CLINIC_OR_DEPARTMENT_OTHER): Payer: Medicare Other

## 2015-11-22 DIAGNOSIS — Z9181 History of falling: Secondary | ICD-10-CM | POA: Diagnosis not present

## 2015-11-22 DIAGNOSIS — N183 Chronic kidney disease, stage 3 (moderate): Secondary | ICD-10-CM | POA: Diagnosis not present

## 2015-11-22 DIAGNOSIS — I130 Hypertensive heart and chronic kidney disease with heart failure and stage 1 through stage 4 chronic kidney disease, or unspecified chronic kidney disease: Secondary | ICD-10-CM | POA: Diagnosis not present

## 2015-11-22 DIAGNOSIS — Z9981 Dependence on supplemental oxygen: Secondary | ICD-10-CM | POA: Diagnosis not present

## 2015-11-22 DIAGNOSIS — E079 Disorder of thyroid, unspecified: Secondary | ICD-10-CM | POA: Diagnosis not present

## 2015-11-22 DIAGNOSIS — Z7902 Long term (current) use of antithrombotics/antiplatelets: Secondary | ICD-10-CM | POA: Diagnosis not present

## 2015-11-22 DIAGNOSIS — L89321 Pressure ulcer of left buttock, stage 1: Secondary | ICD-10-CM | POA: Diagnosis not present

## 2015-11-22 DIAGNOSIS — I4891 Unspecified atrial fibrillation: Secondary | ICD-10-CM | POA: Diagnosis not present

## 2015-11-22 DIAGNOSIS — D631 Anemia in chronic kidney disease: Secondary | ICD-10-CM | POA: Diagnosis not present

## 2015-11-22 DIAGNOSIS — E1122 Type 2 diabetes mellitus with diabetic chronic kidney disease: Secondary | ICD-10-CM | POA: Diagnosis not present

## 2015-11-22 DIAGNOSIS — I5032 Chronic diastolic (congestive) heart failure: Secondary | ICD-10-CM | POA: Diagnosis not present

## 2015-11-22 DIAGNOSIS — J9611 Chronic respiratory failure with hypoxia: Secondary | ICD-10-CM | POA: Diagnosis not present

## 2015-11-22 DIAGNOSIS — Z7982 Long term (current) use of aspirin: Secondary | ICD-10-CM | POA: Diagnosis not present

## 2015-11-22 DIAGNOSIS — L89312 Pressure ulcer of right buttock, stage 2: Secondary | ICD-10-CM | POA: Diagnosis not present

## 2015-11-23 DIAGNOSIS — E1122 Type 2 diabetes mellitus with diabetic chronic kidney disease: Secondary | ICD-10-CM | POA: Diagnosis not present

## 2015-11-23 DIAGNOSIS — L89312 Pressure ulcer of right buttock, stage 2: Secondary | ICD-10-CM | POA: Diagnosis not present

## 2015-11-23 DIAGNOSIS — I5032 Chronic diastolic (congestive) heart failure: Secondary | ICD-10-CM | POA: Diagnosis not present

## 2015-11-23 DIAGNOSIS — I130 Hypertensive heart and chronic kidney disease with heart failure and stage 1 through stage 4 chronic kidney disease, or unspecified chronic kidney disease: Secondary | ICD-10-CM | POA: Diagnosis not present

## 2015-11-23 DIAGNOSIS — L89321 Pressure ulcer of left buttock, stage 1: Secondary | ICD-10-CM | POA: Diagnosis not present

## 2015-11-23 DIAGNOSIS — N183 Chronic kidney disease, stage 3 (moderate): Secondary | ICD-10-CM | POA: Diagnosis not present

## 2015-11-26 ENCOUNTER — Encounter: Payer: Self-pay | Admitting: Family Medicine

## 2015-11-26 ENCOUNTER — Ambulatory Visit (INDEPENDENT_AMBULATORY_CARE_PROVIDER_SITE_OTHER): Payer: Medicare Other | Admitting: Family Medicine

## 2015-11-26 DIAGNOSIS — R5383 Other fatigue: Secondary | ICD-10-CM | POA: Diagnosis not present

## 2015-11-26 DIAGNOSIS — Z23 Encounter for immunization: Secondary | ICD-10-CM

## 2015-11-26 DIAGNOSIS — E1122 Type 2 diabetes mellitus with diabetic chronic kidney disease: Secondary | ICD-10-CM | POA: Diagnosis not present

## 2015-11-26 DIAGNOSIS — L89312 Pressure ulcer of right buttock, stage 2: Secondary | ICD-10-CM | POA: Diagnosis not present

## 2015-11-26 DIAGNOSIS — I130 Hypertensive heart and chronic kidney disease with heart failure and stage 1 through stage 4 chronic kidney disease, or unspecified chronic kidney disease: Secondary | ICD-10-CM | POA: Diagnosis not present

## 2015-11-26 DIAGNOSIS — N183 Chronic kidney disease, stage 3 (moderate): Secondary | ICD-10-CM | POA: Diagnosis not present

## 2015-11-26 DIAGNOSIS — L89321 Pressure ulcer of left buttock, stage 1: Secondary | ICD-10-CM | POA: Diagnosis not present

## 2015-11-26 DIAGNOSIS — I5032 Chronic diastolic (congestive) heart failure: Secondary | ICD-10-CM | POA: Diagnosis not present

## 2015-11-26 NOTE — Progress Notes (Signed)
Pre visit review using our clinic review tool, if applicable. No additional management support is needed unless otherwise documented below in the visit note. 

## 2015-11-26 NOTE — Patient Instructions (Signed)
Continue current medications for now  I would leave her oxygen at 1 L/m  Labs today,,,,,,,,, I'll call you the report  Orthopedic consult........... call Dr. Joni Fears.,

## 2015-11-26 NOTE — Progress Notes (Signed)
Amanda Cunningham is a 82 year old married female nonsmoker who comes in today company by her husband and her daughter Amanda Cunningham for evaluation of multiple issues  Her ongoing medical problems are diastolic dysfunction chronic renal insufficiency anemia secondary to renal insufficiency breast cancer which seems to be quiet and numerous other medical problems. We saw her couple weeks ago and gave her a unit of blood last Monday with no increase in energy. She says she's fatigued she has no energy she can't do anything. She did go to see the wound care center because of 2 lesions on her hips. They recommend orthopedic consult because he feels she might have bursitis and steroid injection would help. She also saw nephrology last week they offered no new suggestions. Her medications have remained unchanged. The daughter increased her oxygen to from 1 L/m to 2 with no increase in energy level. Pulse ox on 1 L 94% as it is on 2 therefore 1 L would be most appropriate at this juncture.  She says she's not in any pain urine habits bowel habits are normal. She just has no energy.  Medication review unchanged  Physical examination vital signs stable she's afebrile weight is down to 166 from 172 over a month. A time. Blood pressure steady 146/50 pulse is 76 with an occasional skip which is unchanged.  Lungs are clear pulse is 76 with occasional skip murmurs of valvular heart disease unchanged  Impression #1 fatigue no energy............. secondary to multiple medical problems which include diastolic dysfunction renal insufficiency anemia etc. etc.  I discussed with the daughter hospice and she states they're not ready for that yet. However I can't really find anything I can put my finger on to help fix these underlying problems.

## 2015-11-27 ENCOUNTER — Other Ambulatory Visit: Payer: Self-pay | Admitting: Family Medicine

## 2015-11-27 DIAGNOSIS — I132 Hypertensive heart and chronic kidney disease with heart failure and with stage 5 chronic kidney disease, or end stage renal disease: Secondary | ICD-10-CM | POA: Diagnosis not present

## 2015-11-27 DIAGNOSIS — E1122 Type 2 diabetes mellitus with diabetic chronic kidney disease: Secondary | ICD-10-CM | POA: Diagnosis not present

## 2015-11-27 DIAGNOSIS — N183 Chronic kidney disease, stage 3 unspecified: Secondary | ICD-10-CM

## 2015-11-27 DIAGNOSIS — I509 Heart failure, unspecified: Secondary | ICD-10-CM | POA: Diagnosis not present

## 2015-11-27 DIAGNOSIS — L89312 Pressure ulcer of right buttock, stage 2: Secondary | ICD-10-CM | POA: Diagnosis not present

## 2015-11-27 DIAGNOSIS — Z87891 Personal history of nicotine dependence: Secondary | ICD-10-CM | POA: Diagnosis not present

## 2015-11-27 DIAGNOSIS — Z872 Personal history of diseases of the skin and subcutaneous tissue: Secondary | ICD-10-CM | POA: Diagnosis not present

## 2015-11-27 DIAGNOSIS — I5032 Chronic diastolic (congestive) heart failure: Secondary | ICD-10-CM

## 2015-11-27 DIAGNOSIS — L89321 Pressure ulcer of left buttock, stage 1: Secondary | ICD-10-CM | POA: Diagnosis not present

## 2015-11-27 DIAGNOSIS — Z09 Encounter for follow-up examination after completed treatment for conditions other than malignant neoplasm: Secondary | ICD-10-CM | POA: Diagnosis not present

## 2015-11-27 LAB — BASIC METABOLIC PANEL
BUN: 65 mg/dL — ABNORMAL HIGH (ref 6–23)
CHLORIDE: 102 meq/L (ref 96–112)
CO2: 27 meq/L (ref 19–32)
Calcium: 9 mg/dL (ref 8.4–10.5)
Creatinine, Ser: 2.52 mg/dL — ABNORMAL HIGH (ref 0.40–1.20)
GFR: 18.87 mL/min — AB (ref >60.00)
GLUCOSE: 166 mg/dL — AB (ref 70–99)
POTASSIUM: 3.9 meq/L (ref 3.5–5.1)
SODIUM: 144 meq/L (ref 135–145)

## 2015-11-27 LAB — CBC WITH DIFFERENTIAL/PLATELET
BASOS ABS: 0 10*3/uL (ref 0.0–0.1)
Basophils Relative: 0.2 % (ref 0.0–3.0)
EOS ABS: 0.2 10*3/uL (ref 0.0–0.7)
Eosinophils Relative: 1.7 % (ref 0.0–5.0)
HEMATOCRIT: 33.4 % — AB (ref 36.0–46.0)
HEMOGLOBIN: 10.8 g/dL — AB (ref 12.0–15.0)
LYMPHS PCT: 8.4 % — AB (ref 12.0–46.0)
Lymphs Abs: 1 10*3/uL (ref 0.7–4.0)
MCHC: 32.4 g/dL (ref 30.0–36.0)
MCV: 87.1 fl (ref 78.0–100.0)
MONOS PCT: 4.4 % (ref 3.0–12.0)
Monocytes Absolute: 0.5 10*3/uL (ref 0.1–1.0)
Neutro Abs: 10.1 10*3/uL — ABNORMAL HIGH (ref 1.4–7.7)
Neutrophils Relative %: 85.3 % — ABNORMAL HIGH (ref 43.0–77.0)
Platelets: 269 10*3/uL (ref 150.0–400.0)
RBC: 3.84 Mil/uL — AB (ref 3.87–5.11)
RDW: 17.6 % — ABNORMAL HIGH (ref 11.5–15.5)
WBC: 11.8 10*3/uL — AB (ref 4.0–10.5)

## 2015-11-27 LAB — TSH: TSH: 3.26 u[IU]/mL (ref 0.35–4.50)

## 2015-11-29 DIAGNOSIS — E039 Hypothyroidism, unspecified: Secondary | ICD-10-CM | POA: Diagnosis not present

## 2015-11-29 DIAGNOSIS — I87319 Chronic venous hypertension (idiopathic) with ulcer of unspecified lower extremity: Secondary | ICD-10-CM | POA: Diagnosis not present

## 2015-11-29 DIAGNOSIS — R630 Anorexia: Secondary | ICD-10-CM | POA: Diagnosis not present

## 2015-11-29 DIAGNOSIS — R0600 Dyspnea, unspecified: Secondary | ICD-10-CM | POA: Diagnosis not present

## 2015-11-29 DIAGNOSIS — R609 Edema, unspecified: Secondary | ICD-10-CM | POA: Diagnosis not present

## 2015-11-29 DIAGNOSIS — D631 Anemia in chronic kidney disease: Secondary | ICD-10-CM | POA: Diagnosis not present

## 2015-11-29 DIAGNOSIS — K219 Gastro-esophageal reflux disease without esophagitis: Secondary | ICD-10-CM | POA: Diagnosis not present

## 2015-11-29 DIAGNOSIS — M109 Gout, unspecified: Secondary | ICD-10-CM | POA: Diagnosis not present

## 2015-11-29 DIAGNOSIS — N184 Chronic kidney disease, stage 4 (severe): Secondary | ICD-10-CM | POA: Diagnosis not present

## 2015-11-29 DIAGNOSIS — E1159 Type 2 diabetes mellitus with other circulatory complications: Secondary | ICD-10-CM | POA: Diagnosis not present

## 2015-11-29 DIAGNOSIS — R634 Abnormal weight loss: Secondary | ICD-10-CM | POA: Diagnosis not present

## 2015-11-29 DIAGNOSIS — I5030 Unspecified diastolic (congestive) heart failure: Secondary | ICD-10-CM | POA: Diagnosis not present

## 2015-11-30 DIAGNOSIS — R0600 Dyspnea, unspecified: Secondary | ICD-10-CM | POA: Diagnosis not present

## 2015-11-30 DIAGNOSIS — N184 Chronic kidney disease, stage 4 (severe): Secondary | ICD-10-CM | POA: Diagnosis not present

## 2015-11-30 DIAGNOSIS — R609 Edema, unspecified: Secondary | ICD-10-CM | POA: Diagnosis not present

## 2015-11-30 DIAGNOSIS — R634 Abnormal weight loss: Secondary | ICD-10-CM | POA: Diagnosis not present

## 2015-11-30 DIAGNOSIS — R630 Anorexia: Secondary | ICD-10-CM | POA: Diagnosis not present

## 2015-11-30 DIAGNOSIS — I5030 Unspecified diastolic (congestive) heart failure: Secondary | ICD-10-CM | POA: Diagnosis not present

## 2015-12-03 DIAGNOSIS — R630 Anorexia: Secondary | ICD-10-CM | POA: Diagnosis not present

## 2015-12-03 DIAGNOSIS — N184 Chronic kidney disease, stage 4 (severe): Secondary | ICD-10-CM | POA: Diagnosis not present

## 2015-12-03 DIAGNOSIS — R634 Abnormal weight loss: Secondary | ICD-10-CM | POA: Diagnosis not present

## 2015-12-03 DIAGNOSIS — R609 Edema, unspecified: Secondary | ICD-10-CM | POA: Diagnosis not present

## 2015-12-03 DIAGNOSIS — I5030 Unspecified diastolic (congestive) heart failure: Secondary | ICD-10-CM | POA: Diagnosis not present

## 2015-12-03 DIAGNOSIS — R0600 Dyspnea, unspecified: Secondary | ICD-10-CM | POA: Diagnosis not present

## 2015-12-05 DIAGNOSIS — N184 Chronic kidney disease, stage 4 (severe): Secondary | ICD-10-CM | POA: Diagnosis not present

## 2015-12-05 DIAGNOSIS — R630 Anorexia: Secondary | ICD-10-CM | POA: Diagnosis not present

## 2015-12-05 DIAGNOSIS — R0600 Dyspnea, unspecified: Secondary | ICD-10-CM | POA: Diagnosis not present

## 2015-12-05 DIAGNOSIS — R609 Edema, unspecified: Secondary | ICD-10-CM | POA: Diagnosis not present

## 2015-12-05 DIAGNOSIS — R634 Abnormal weight loss: Secondary | ICD-10-CM | POA: Diagnosis not present

## 2015-12-05 DIAGNOSIS — I5030 Unspecified diastolic (congestive) heart failure: Secondary | ICD-10-CM | POA: Diagnosis not present

## 2015-12-06 ENCOUNTER — Encounter (HOSPITAL_COMMUNITY): Payer: Medicare Other

## 2015-12-07 DIAGNOSIS — I5030 Unspecified diastolic (congestive) heart failure: Secondary | ICD-10-CM | POA: Diagnosis not present

## 2015-12-07 DIAGNOSIS — N184 Chronic kidney disease, stage 4 (severe): Secondary | ICD-10-CM | POA: Diagnosis not present

## 2015-12-07 DIAGNOSIS — R0600 Dyspnea, unspecified: Secondary | ICD-10-CM | POA: Diagnosis not present

## 2015-12-07 DIAGNOSIS — R630 Anorexia: Secondary | ICD-10-CM | POA: Diagnosis not present

## 2015-12-07 DIAGNOSIS — R609 Edema, unspecified: Secondary | ICD-10-CM | POA: Diagnosis not present

## 2015-12-07 DIAGNOSIS — R634 Abnormal weight loss: Secondary | ICD-10-CM | POA: Diagnosis not present

## 2015-12-10 DIAGNOSIS — I5030 Unspecified diastolic (congestive) heart failure: Secondary | ICD-10-CM | POA: Diagnosis not present

## 2015-12-10 DIAGNOSIS — R630 Anorexia: Secondary | ICD-10-CM | POA: Diagnosis not present

## 2015-12-10 DIAGNOSIS — R609 Edema, unspecified: Secondary | ICD-10-CM | POA: Diagnosis not present

## 2015-12-10 DIAGNOSIS — R634 Abnormal weight loss: Secondary | ICD-10-CM | POA: Diagnosis not present

## 2015-12-10 DIAGNOSIS — N184 Chronic kidney disease, stage 4 (severe): Secondary | ICD-10-CM | POA: Diagnosis not present

## 2015-12-10 DIAGNOSIS — R0600 Dyspnea, unspecified: Secondary | ICD-10-CM | POA: Diagnosis not present

## 2015-12-12 DIAGNOSIS — I5030 Unspecified diastolic (congestive) heart failure: Secondary | ICD-10-CM | POA: Diagnosis not present

## 2015-12-12 DIAGNOSIS — E039 Hypothyroidism, unspecified: Secondary | ICD-10-CM | POA: Diagnosis not present

## 2015-12-12 DIAGNOSIS — M109 Gout, unspecified: Secondary | ICD-10-CM | POA: Diagnosis not present

## 2015-12-12 DIAGNOSIS — I87319 Chronic venous hypertension (idiopathic) with ulcer of unspecified lower extremity: Secondary | ICD-10-CM | POA: Diagnosis not present

## 2015-12-12 DIAGNOSIS — R0600 Dyspnea, unspecified: Secondary | ICD-10-CM | POA: Diagnosis not present

## 2015-12-12 DIAGNOSIS — N184 Chronic kidney disease, stage 4 (severe): Secondary | ICD-10-CM | POA: Diagnosis not present

## 2015-12-12 DIAGNOSIS — E1159 Type 2 diabetes mellitus with other circulatory complications: Secondary | ICD-10-CM | POA: Diagnosis not present

## 2015-12-12 DIAGNOSIS — R634 Abnormal weight loss: Secondary | ICD-10-CM | POA: Diagnosis not present

## 2015-12-12 DIAGNOSIS — D631 Anemia in chronic kidney disease: Secondary | ICD-10-CM | POA: Diagnosis not present

## 2015-12-12 DIAGNOSIS — K219 Gastro-esophageal reflux disease without esophagitis: Secondary | ICD-10-CM | POA: Diagnosis not present

## 2015-12-12 DIAGNOSIS — R609 Edema, unspecified: Secondary | ICD-10-CM | POA: Diagnosis not present

## 2015-12-12 DIAGNOSIS — R630 Anorexia: Secondary | ICD-10-CM | POA: Diagnosis not present

## 2015-12-18 ENCOUNTER — Telehealth: Payer: Self-pay | Admitting: Family Medicine

## 2015-12-18 NOTE — Telephone Encounter (Signed)
Mendel Ryder state that the pts Bp has been running on the low size in the mornings per the daughter and they would like to have some parameters to go by and also the pt has had some nausea and hospice can give the pt Compazine if it is okay with the MD.  In the afternoon the nurse state that the Bp has been running a little high.  Pls call Mendel Ryder with results.

## 2015-12-19 DIAGNOSIS — N184 Chronic kidney disease, stage 4 (severe): Secondary | ICD-10-CM | POA: Diagnosis not present

## 2015-12-19 NOTE — Telephone Encounter (Signed)
Spoke with Dr Sherren Mocha regarding pt. He states that he has no parameters for her blood pressure. He does not feel that it is necessary to take very often. He also does not want pt to have Compazine for nausea. He would like pt to have Zofran, 4mg , q6-8hrs prn nausea.   LMTCB for [employer]

## 2015-12-20 MED ORDER — ONDANSETRON 4 MG PO TBDP: 4.0000 mg | ORAL_TABLET | Freq: Three times a day (TID) | ORAL | 1 refills | Status: DC | PRN

## 2015-12-20 NOTE — Telephone Encounter (Signed)
Spoke with [facility] and gave Dr Honor Junes recommendations. She agrees. Rx sent to pharmacy. Nothing further needed.

## 2015-12-20 NOTE — Telephone Encounter (Signed)
LMTCB

## 2015-12-26 ENCOUNTER — Telehealth: Payer: Self-pay | Admitting: Family Medicine

## 2015-12-26 NOTE — Telephone Encounter (Signed)
Amanda Cunningham with [facility] states they need the O2 form sent back with a date by the dr initials   per medicare requirements.  Fax [phone]

## 2016-01-06 ENCOUNTER — Other Ambulatory Visit: Payer: Self-pay | Admitting: Family Medicine

## 2016-01-09 NOTE — Telephone Encounter (Signed)
Please Advise. Has this issue already been addressed.

## 2016-01-10 DIAGNOSIS — E1151 Type 2 diabetes mellitus with diabetic peripheral angiopathy without gangrene: Secondary | ICD-10-CM | POA: Diagnosis not present

## 2016-01-10 DIAGNOSIS — L603 Nail dystrophy: Secondary | ICD-10-CM | POA: Diagnosis not present

## 2016-01-10 DIAGNOSIS — I739 Peripheral vascular disease, unspecified: Secondary | ICD-10-CM | POA: Diagnosis not present

## 2016-01-11 DIAGNOSIS — R0600 Dyspnea, unspecified: Secondary | ICD-10-CM | POA: Diagnosis not present

## 2016-01-11 DIAGNOSIS — E039 Hypothyroidism, unspecified: Secondary | ICD-10-CM | POA: Diagnosis not present

## 2016-01-11 DIAGNOSIS — I87319 Chronic venous hypertension (idiopathic) with ulcer of unspecified lower extremity: Secondary | ICD-10-CM | POA: Diagnosis not present

## 2016-01-11 DIAGNOSIS — R609 Edema, unspecified: Secondary | ICD-10-CM | POA: Diagnosis not present

## 2016-01-11 DIAGNOSIS — R634 Abnormal weight loss: Secondary | ICD-10-CM | POA: Diagnosis not present

## 2016-01-11 DIAGNOSIS — R630 Anorexia: Secondary | ICD-10-CM | POA: Diagnosis not present

## 2016-01-11 DIAGNOSIS — N184 Chronic kidney disease, stage 4 (severe): Secondary | ICD-10-CM | POA: Diagnosis not present

## 2016-01-11 DIAGNOSIS — I5030 Unspecified diastolic (congestive) heart failure: Secondary | ICD-10-CM | POA: Diagnosis not present

## 2016-01-11 DIAGNOSIS — M109 Gout, unspecified: Secondary | ICD-10-CM | POA: Diagnosis not present

## 2016-01-11 DIAGNOSIS — K219 Gastro-esophageal reflux disease without esophagitis: Secondary | ICD-10-CM | POA: Diagnosis not present

## 2016-01-11 DIAGNOSIS — E1159 Type 2 diabetes mellitus with other circulatory complications: Secondary | ICD-10-CM | POA: Diagnosis not present

## 2016-01-11 DIAGNOSIS — D631 Anemia in chronic kidney disease: Secondary | ICD-10-CM | POA: Diagnosis not present

## 2016-01-11 NOTE — Telephone Encounter (Signed)
Anselmo Pickler, LPN has addressed request and followed up via other forms of communication (phone call and fax).

## 2016-01-15 ENCOUNTER — Other Ambulatory Visit: Payer: Self-pay | Admitting: Family Medicine

## 2016-01-15 DIAGNOSIS — R0600 Dyspnea, unspecified: Secondary | ICD-10-CM | POA: Diagnosis not present

## 2016-01-15 DIAGNOSIS — R630 Anorexia: Secondary | ICD-10-CM | POA: Diagnosis not present

## 2016-01-15 DIAGNOSIS — R609 Edema, unspecified: Secondary | ICD-10-CM | POA: Diagnosis not present

## 2016-01-15 DIAGNOSIS — N184 Chronic kidney disease, stage 4 (severe): Secondary | ICD-10-CM | POA: Diagnosis not present

## 2016-01-15 DIAGNOSIS — R634 Abnormal weight loss: Secondary | ICD-10-CM | POA: Diagnosis not present

## 2016-01-15 DIAGNOSIS — I5030 Unspecified diastolic (congestive) heart failure: Secondary | ICD-10-CM | POA: Diagnosis not present

## 2016-01-16 NOTE — Telephone Encounter (Signed)
Please re-fill.  Please route Coumadin/Warfarin to me.  I don't re-fill plavix, eliquis, xarelto etc.

## 2016-01-17 DIAGNOSIS — R609 Edema, unspecified: Secondary | ICD-10-CM | POA: Diagnosis not present

## 2016-01-17 DIAGNOSIS — R0600 Dyspnea, unspecified: Secondary | ICD-10-CM | POA: Diagnosis not present

## 2016-01-17 DIAGNOSIS — R634 Abnormal weight loss: Secondary | ICD-10-CM | POA: Diagnosis not present

## 2016-01-17 DIAGNOSIS — I5030 Unspecified diastolic (congestive) heart failure: Secondary | ICD-10-CM | POA: Diagnosis not present

## 2016-01-17 DIAGNOSIS — R630 Anorexia: Secondary | ICD-10-CM | POA: Diagnosis not present

## 2016-01-17 DIAGNOSIS — N184 Chronic kidney disease, stage 4 (severe): Secondary | ICD-10-CM | POA: Diagnosis not present

## 2016-01-18 DIAGNOSIS — R0600 Dyspnea, unspecified: Secondary | ICD-10-CM | POA: Diagnosis not present

## 2016-01-18 DIAGNOSIS — I5030 Unspecified diastolic (congestive) heart failure: Secondary | ICD-10-CM | POA: Diagnosis not present

## 2016-01-18 DIAGNOSIS — R634 Abnormal weight loss: Secondary | ICD-10-CM | POA: Diagnosis not present

## 2016-01-18 DIAGNOSIS — N184 Chronic kidney disease, stage 4 (severe): Secondary | ICD-10-CM | POA: Diagnosis not present

## 2016-01-18 DIAGNOSIS — R630 Anorexia: Secondary | ICD-10-CM | POA: Diagnosis not present

## 2016-01-18 DIAGNOSIS — R609 Edema, unspecified: Secondary | ICD-10-CM | POA: Diagnosis not present

## 2016-01-19 DIAGNOSIS — I5030 Unspecified diastolic (congestive) heart failure: Secondary | ICD-10-CM | POA: Diagnosis not present

## 2016-01-19 DIAGNOSIS — N184 Chronic kidney disease, stage 4 (severe): Secondary | ICD-10-CM | POA: Diagnosis not present

## 2016-01-19 DIAGNOSIS — R634 Abnormal weight loss: Secondary | ICD-10-CM | POA: Diagnosis not present

## 2016-01-19 DIAGNOSIS — R630 Anorexia: Secondary | ICD-10-CM | POA: Diagnosis not present

## 2016-01-19 DIAGNOSIS — R0600 Dyspnea, unspecified: Secondary | ICD-10-CM | POA: Diagnosis not present

## 2016-01-19 DIAGNOSIS — R609 Edema, unspecified: Secondary | ICD-10-CM | POA: Diagnosis not present

## 2016-01-22 DIAGNOSIS — R634 Abnormal weight loss: Secondary | ICD-10-CM | POA: Diagnosis not present

## 2016-01-22 DIAGNOSIS — N184 Chronic kidney disease, stage 4 (severe): Secondary | ICD-10-CM | POA: Diagnosis not present

## 2016-01-22 DIAGNOSIS — I5030 Unspecified diastolic (congestive) heart failure: Secondary | ICD-10-CM | POA: Diagnosis not present

## 2016-01-22 DIAGNOSIS — R609 Edema, unspecified: Secondary | ICD-10-CM | POA: Diagnosis not present

## 2016-01-22 DIAGNOSIS — R630 Anorexia: Secondary | ICD-10-CM | POA: Diagnosis not present

## 2016-01-22 DIAGNOSIS — R0600 Dyspnea, unspecified: Secondary | ICD-10-CM | POA: Diagnosis not present

## 2016-01-23 DIAGNOSIS — I5030 Unspecified diastolic (congestive) heart failure: Secondary | ICD-10-CM | POA: Diagnosis not present

## 2016-01-23 DIAGNOSIS — R609 Edema, unspecified: Secondary | ICD-10-CM | POA: Diagnosis not present

## 2016-01-23 DIAGNOSIS — R634 Abnormal weight loss: Secondary | ICD-10-CM | POA: Diagnosis not present

## 2016-01-23 DIAGNOSIS — R630 Anorexia: Secondary | ICD-10-CM | POA: Diagnosis not present

## 2016-01-23 DIAGNOSIS — N184 Chronic kidney disease, stage 4 (severe): Secondary | ICD-10-CM | POA: Diagnosis not present

## 2016-01-23 DIAGNOSIS — R0600 Dyspnea, unspecified: Secondary | ICD-10-CM | POA: Diagnosis not present

## 2016-01-25 DIAGNOSIS — R634 Abnormal weight loss: Secondary | ICD-10-CM | POA: Diagnosis not present

## 2016-01-25 DIAGNOSIS — N184 Chronic kidney disease, stage 4 (severe): Secondary | ICD-10-CM | POA: Diagnosis not present

## 2016-01-25 DIAGNOSIS — R630 Anorexia: Secondary | ICD-10-CM | POA: Diagnosis not present

## 2016-01-25 DIAGNOSIS — I5030 Unspecified diastolic (congestive) heart failure: Secondary | ICD-10-CM | POA: Diagnosis not present

## 2016-01-25 DIAGNOSIS — R0600 Dyspnea, unspecified: Secondary | ICD-10-CM | POA: Diagnosis not present

## 2016-01-25 DIAGNOSIS — R609 Edema, unspecified: Secondary | ICD-10-CM | POA: Diagnosis not present

## 2016-01-28 ENCOUNTER — Telehealth: Payer: Self-pay | Admitting: *Deleted

## 2016-01-28 DIAGNOSIS — Z66 Do not resuscitate: Secondary | ICD-10-CM

## 2016-01-28 NOTE — Telephone Encounter (Signed)
Patient's husband walked in today requesting she and him be made to a DNR code status. Spoke with his PCP, Dr. Sherren Mocha and Dr. Sherren Mocha confirm patient is in hospice care and he is aware of the patient's requests to change to a DNR status.   Dr. Sherren Mocha was given two DNR forms and completed each form, one for patient Amanda Cunningham) and the husband Amanda Cunningham). Confirmed with Dr. Sherren Mocha the patient's inability to come to the office to pick up form in person and to address status change. Dr. Sherren Mocha reiterated patient was in hospice care and not doing well.   Patient's husband was brought back into an exam room and was explained what a DNR code status meant. Patient's husband verbalized understanding.   Both DNR forms were scanned into the chart and DNR code change order will be placed with provider sign off required.

## 2016-01-29 DIAGNOSIS — R630 Anorexia: Secondary | ICD-10-CM | POA: Diagnosis not present

## 2016-01-29 DIAGNOSIS — N184 Chronic kidney disease, stage 4 (severe): Secondary | ICD-10-CM | POA: Diagnosis not present

## 2016-01-29 DIAGNOSIS — R634 Abnormal weight loss: Secondary | ICD-10-CM | POA: Diagnosis not present

## 2016-01-29 DIAGNOSIS — R0600 Dyspnea, unspecified: Secondary | ICD-10-CM | POA: Diagnosis not present

## 2016-01-29 DIAGNOSIS — R609 Edema, unspecified: Secondary | ICD-10-CM | POA: Diagnosis not present

## 2016-01-29 DIAGNOSIS — I5030 Unspecified diastolic (congestive) heart failure: Secondary | ICD-10-CM | POA: Diagnosis not present

## 2016-01-31 DIAGNOSIS — N184 Chronic kidney disease, stage 4 (severe): Secondary | ICD-10-CM | POA: Diagnosis not present

## 2016-01-31 DIAGNOSIS — R634 Abnormal weight loss: Secondary | ICD-10-CM | POA: Diagnosis not present

## 2016-01-31 DIAGNOSIS — R0600 Dyspnea, unspecified: Secondary | ICD-10-CM | POA: Diagnosis not present

## 2016-01-31 DIAGNOSIS — R609 Edema, unspecified: Secondary | ICD-10-CM | POA: Diagnosis not present

## 2016-01-31 DIAGNOSIS — R630 Anorexia: Secondary | ICD-10-CM | POA: Diagnosis not present

## 2016-01-31 DIAGNOSIS — I5030 Unspecified diastolic (congestive) heart failure: Secondary | ICD-10-CM | POA: Diagnosis not present

## 2016-02-01 DIAGNOSIS — I5030 Unspecified diastolic (congestive) heart failure: Secondary | ICD-10-CM | POA: Diagnosis not present

## 2016-02-01 DIAGNOSIS — R630 Anorexia: Secondary | ICD-10-CM | POA: Diagnosis not present

## 2016-02-01 DIAGNOSIS — R609 Edema, unspecified: Secondary | ICD-10-CM | POA: Diagnosis not present

## 2016-02-01 DIAGNOSIS — N184 Chronic kidney disease, stage 4 (severe): Secondary | ICD-10-CM | POA: Diagnosis not present

## 2016-02-01 DIAGNOSIS — R634 Abnormal weight loss: Secondary | ICD-10-CM | POA: Diagnosis not present

## 2016-02-01 DIAGNOSIS — R0600 Dyspnea, unspecified: Secondary | ICD-10-CM | POA: Diagnosis not present

## 2016-02-03 DIAGNOSIS — R609 Edema, unspecified: Secondary | ICD-10-CM | POA: Diagnosis not present

## 2016-02-03 DIAGNOSIS — I5030 Unspecified diastolic (congestive) heart failure: Secondary | ICD-10-CM | POA: Diagnosis not present

## 2016-02-03 DIAGNOSIS — N184 Chronic kidney disease, stage 4 (severe): Secondary | ICD-10-CM | POA: Diagnosis not present

## 2016-02-03 DIAGNOSIS — R630 Anorexia: Secondary | ICD-10-CM | POA: Diagnosis not present

## 2016-02-03 DIAGNOSIS — R634 Abnormal weight loss: Secondary | ICD-10-CM | POA: Diagnosis not present

## 2016-02-03 DIAGNOSIS — R0600 Dyspnea, unspecified: Secondary | ICD-10-CM | POA: Diagnosis not present

## 2016-02-05 DIAGNOSIS — R609 Edema, unspecified: Secondary | ICD-10-CM | POA: Diagnosis not present

## 2016-02-05 DIAGNOSIS — R0600 Dyspnea, unspecified: Secondary | ICD-10-CM | POA: Diagnosis not present

## 2016-02-05 DIAGNOSIS — N184 Chronic kidney disease, stage 4 (severe): Secondary | ICD-10-CM | POA: Diagnosis not present

## 2016-02-05 DIAGNOSIS — R634 Abnormal weight loss: Secondary | ICD-10-CM | POA: Diagnosis not present

## 2016-02-05 DIAGNOSIS — R630 Anorexia: Secondary | ICD-10-CM | POA: Diagnosis not present

## 2016-02-05 DIAGNOSIS — I5030 Unspecified diastolic (congestive) heart failure: Secondary | ICD-10-CM | POA: Diagnosis not present

## 2016-02-06 DIAGNOSIS — R609 Edema, unspecified: Secondary | ICD-10-CM | POA: Diagnosis not present

## 2016-02-06 DIAGNOSIS — N184 Chronic kidney disease, stage 4 (severe): Secondary | ICD-10-CM | POA: Diagnosis not present

## 2016-02-06 DIAGNOSIS — R630 Anorexia: Secondary | ICD-10-CM | POA: Diagnosis not present

## 2016-02-06 DIAGNOSIS — I5030 Unspecified diastolic (congestive) heart failure: Secondary | ICD-10-CM | POA: Diagnosis not present

## 2016-02-06 DIAGNOSIS — R0600 Dyspnea, unspecified: Secondary | ICD-10-CM | POA: Diagnosis not present

## 2016-02-06 DIAGNOSIS — R634 Abnormal weight loss: Secondary | ICD-10-CM | POA: Diagnosis not present

## 2016-02-08 DIAGNOSIS — R609 Edema, unspecified: Secondary | ICD-10-CM | POA: Diagnosis not present

## 2016-02-08 DIAGNOSIS — R0600 Dyspnea, unspecified: Secondary | ICD-10-CM | POA: Diagnosis not present

## 2016-02-08 DIAGNOSIS — R634 Abnormal weight loss: Secondary | ICD-10-CM | POA: Diagnosis not present

## 2016-02-08 DIAGNOSIS — N184 Chronic kidney disease, stage 4 (severe): Secondary | ICD-10-CM | POA: Diagnosis not present

## 2016-02-08 DIAGNOSIS — I5030 Unspecified diastolic (congestive) heart failure: Secondary | ICD-10-CM | POA: Diagnosis not present

## 2016-02-08 DIAGNOSIS — R630 Anorexia: Secondary | ICD-10-CM | POA: Diagnosis not present

## 2016-02-11 DIAGNOSIS — R0600 Dyspnea, unspecified: Secondary | ICD-10-CM | POA: Diagnosis not present

## 2016-02-11 DIAGNOSIS — R609 Edema, unspecified: Secondary | ICD-10-CM | POA: Diagnosis not present

## 2016-02-11 DIAGNOSIS — R630 Anorexia: Secondary | ICD-10-CM | POA: Diagnosis not present

## 2016-02-11 DIAGNOSIS — R634 Abnormal weight loss: Secondary | ICD-10-CM | POA: Diagnosis not present

## 2016-02-11 DIAGNOSIS — I87319 Chronic venous hypertension (idiopathic) with ulcer of unspecified lower extremity: Secondary | ICD-10-CM | POA: Diagnosis not present

## 2016-02-11 DIAGNOSIS — I5030 Unspecified diastolic (congestive) heart failure: Secondary | ICD-10-CM | POA: Diagnosis not present

## 2016-02-11 DIAGNOSIS — N184 Chronic kidney disease, stage 4 (severe): Secondary | ICD-10-CM | POA: Diagnosis not present

## 2016-02-11 DIAGNOSIS — E039 Hypothyroidism, unspecified: Secondary | ICD-10-CM | POA: Diagnosis not present

## 2016-02-11 DIAGNOSIS — M109 Gout, unspecified: Secondary | ICD-10-CM | POA: Diagnosis not present

## 2016-02-11 DIAGNOSIS — D631 Anemia in chronic kidney disease: Secondary | ICD-10-CM | POA: Diagnosis not present

## 2016-02-11 DIAGNOSIS — K219 Gastro-esophageal reflux disease without esophagitis: Secondary | ICD-10-CM | POA: Diagnosis not present

## 2016-02-11 DIAGNOSIS — E1159 Type 2 diabetes mellitus with other circulatory complications: Secondary | ICD-10-CM | POA: Diagnosis not present

## 2016-02-12 DIAGNOSIS — R630 Anorexia: Secondary | ICD-10-CM | POA: Diagnosis not present

## 2016-02-12 DIAGNOSIS — R609 Edema, unspecified: Secondary | ICD-10-CM | POA: Diagnosis not present

## 2016-02-12 DIAGNOSIS — R634 Abnormal weight loss: Secondary | ICD-10-CM | POA: Diagnosis not present

## 2016-02-12 DIAGNOSIS — R0600 Dyspnea, unspecified: Secondary | ICD-10-CM | POA: Diagnosis not present

## 2016-02-12 DIAGNOSIS — N184 Chronic kidney disease, stage 4 (severe): Secondary | ICD-10-CM | POA: Diagnosis not present

## 2016-02-12 DIAGNOSIS — I5030 Unspecified diastolic (congestive) heart failure: Secondary | ICD-10-CM | POA: Diagnosis not present

## 2016-02-14 DIAGNOSIS — R0600 Dyspnea, unspecified: Secondary | ICD-10-CM | POA: Diagnosis not present

## 2016-02-14 DIAGNOSIS — R634 Abnormal weight loss: Secondary | ICD-10-CM | POA: Diagnosis not present

## 2016-02-14 DIAGNOSIS — R609 Edema, unspecified: Secondary | ICD-10-CM | POA: Diagnosis not present

## 2016-02-14 DIAGNOSIS — R630 Anorexia: Secondary | ICD-10-CM | POA: Diagnosis not present

## 2016-02-14 DIAGNOSIS — I5030 Unspecified diastolic (congestive) heart failure: Secondary | ICD-10-CM | POA: Diagnosis not present

## 2016-02-14 DIAGNOSIS — N184 Chronic kidney disease, stage 4 (severe): Secondary | ICD-10-CM | POA: Diagnosis not present

## 2016-02-15 DIAGNOSIS — N184 Chronic kidney disease, stage 4 (severe): Secondary | ICD-10-CM | POA: Diagnosis not present

## 2016-02-15 DIAGNOSIS — R634 Abnormal weight loss: Secondary | ICD-10-CM | POA: Diagnosis not present

## 2016-02-15 DIAGNOSIS — I5030 Unspecified diastolic (congestive) heart failure: Secondary | ICD-10-CM | POA: Diagnosis not present

## 2016-02-15 DIAGNOSIS — R609 Edema, unspecified: Secondary | ICD-10-CM | POA: Diagnosis not present

## 2016-02-15 DIAGNOSIS — R0600 Dyspnea, unspecified: Secondary | ICD-10-CM | POA: Diagnosis not present

## 2016-02-15 DIAGNOSIS — R630 Anorexia: Secondary | ICD-10-CM | POA: Diagnosis not present

## 2016-02-19 DIAGNOSIS — R0600 Dyspnea, unspecified: Secondary | ICD-10-CM | POA: Diagnosis not present

## 2016-02-19 DIAGNOSIS — R609 Edema, unspecified: Secondary | ICD-10-CM | POA: Diagnosis not present

## 2016-02-19 DIAGNOSIS — R630 Anorexia: Secondary | ICD-10-CM | POA: Diagnosis not present

## 2016-02-19 DIAGNOSIS — N184 Chronic kidney disease, stage 4 (severe): Secondary | ICD-10-CM | POA: Diagnosis not present

## 2016-02-19 DIAGNOSIS — R634 Abnormal weight loss: Secondary | ICD-10-CM | POA: Diagnosis not present

## 2016-02-19 DIAGNOSIS — I5030 Unspecified diastolic (congestive) heart failure: Secondary | ICD-10-CM | POA: Diagnosis not present

## 2016-02-21 DIAGNOSIS — R609 Edema, unspecified: Secondary | ICD-10-CM | POA: Diagnosis not present

## 2016-02-21 DIAGNOSIS — N184 Chronic kidney disease, stage 4 (severe): Secondary | ICD-10-CM | POA: Diagnosis not present

## 2016-02-21 DIAGNOSIS — R630 Anorexia: Secondary | ICD-10-CM | POA: Diagnosis not present

## 2016-02-21 DIAGNOSIS — R634 Abnormal weight loss: Secondary | ICD-10-CM | POA: Diagnosis not present

## 2016-02-21 DIAGNOSIS — I5030 Unspecified diastolic (congestive) heart failure: Secondary | ICD-10-CM | POA: Diagnosis not present

## 2016-02-21 DIAGNOSIS — R0600 Dyspnea, unspecified: Secondary | ICD-10-CM | POA: Diagnosis not present

## 2016-02-22 DIAGNOSIS — R634 Abnormal weight loss: Secondary | ICD-10-CM | POA: Diagnosis not present

## 2016-02-22 DIAGNOSIS — R609 Edema, unspecified: Secondary | ICD-10-CM | POA: Diagnosis not present

## 2016-02-22 DIAGNOSIS — I5030 Unspecified diastolic (congestive) heart failure: Secondary | ICD-10-CM | POA: Diagnosis not present

## 2016-02-22 DIAGNOSIS — R630 Anorexia: Secondary | ICD-10-CM | POA: Diagnosis not present

## 2016-02-22 DIAGNOSIS — N184 Chronic kidney disease, stage 4 (severe): Secondary | ICD-10-CM | POA: Diagnosis not present

## 2016-02-22 DIAGNOSIS — R0600 Dyspnea, unspecified: Secondary | ICD-10-CM | POA: Diagnosis not present

## 2016-02-26 DIAGNOSIS — R0600 Dyspnea, unspecified: Secondary | ICD-10-CM | POA: Diagnosis not present

## 2016-02-26 DIAGNOSIS — R609 Edema, unspecified: Secondary | ICD-10-CM | POA: Diagnosis not present

## 2016-02-26 DIAGNOSIS — I5030 Unspecified diastolic (congestive) heart failure: Secondary | ICD-10-CM | POA: Diagnosis not present

## 2016-02-26 DIAGNOSIS — R630 Anorexia: Secondary | ICD-10-CM | POA: Diagnosis not present

## 2016-02-26 DIAGNOSIS — R634 Abnormal weight loss: Secondary | ICD-10-CM | POA: Diagnosis not present

## 2016-02-26 DIAGNOSIS — N184 Chronic kidney disease, stage 4 (severe): Secondary | ICD-10-CM | POA: Diagnosis not present

## 2016-02-28 DIAGNOSIS — R609 Edema, unspecified: Secondary | ICD-10-CM | POA: Diagnosis not present

## 2016-02-28 DIAGNOSIS — N184 Chronic kidney disease, stage 4 (severe): Secondary | ICD-10-CM | POA: Diagnosis not present

## 2016-02-28 DIAGNOSIS — R630 Anorexia: Secondary | ICD-10-CM | POA: Diagnosis not present

## 2016-02-28 DIAGNOSIS — R0600 Dyspnea, unspecified: Secondary | ICD-10-CM | POA: Diagnosis not present

## 2016-02-28 DIAGNOSIS — I5030 Unspecified diastolic (congestive) heart failure: Secondary | ICD-10-CM | POA: Diagnosis not present

## 2016-02-28 DIAGNOSIS — R634 Abnormal weight loss: Secondary | ICD-10-CM | POA: Diagnosis not present

## 2016-03-04 ENCOUNTER — Other Ambulatory Visit: Payer: Self-pay | Admitting: Family Medicine

## 2016-03-04 DIAGNOSIS — N184 Chronic kidney disease, stage 4 (severe): Secondary | ICD-10-CM | POA: Diagnosis not present

## 2016-03-04 DIAGNOSIS — I5030 Unspecified diastolic (congestive) heart failure: Secondary | ICD-10-CM | POA: Diagnosis not present

## 2016-03-04 DIAGNOSIS — R0600 Dyspnea, unspecified: Secondary | ICD-10-CM | POA: Diagnosis not present

## 2016-03-04 DIAGNOSIS — R609 Edema, unspecified: Secondary | ICD-10-CM | POA: Diagnosis not present

## 2016-03-04 DIAGNOSIS — R630 Anorexia: Secondary | ICD-10-CM | POA: Diagnosis not present

## 2016-03-04 DIAGNOSIS — R634 Abnormal weight loss: Secondary | ICD-10-CM | POA: Diagnosis not present

## 2016-03-06 ENCOUNTER — Telehealth: Payer: Self-pay | Admitting: Family Medicine

## 2016-03-06 DIAGNOSIS — R0600 Dyspnea, unspecified: Secondary | ICD-10-CM | POA: Diagnosis not present

## 2016-03-06 DIAGNOSIS — N184 Chronic kidney disease, stage 4 (severe): Secondary | ICD-10-CM | POA: Diagnosis not present

## 2016-03-06 DIAGNOSIS — R630 Anorexia: Secondary | ICD-10-CM | POA: Diagnosis not present

## 2016-03-06 DIAGNOSIS — R634 Abnormal weight loss: Secondary | ICD-10-CM | POA: Diagnosis not present

## 2016-03-06 DIAGNOSIS — I5030 Unspecified diastolic (congestive) heart failure: Secondary | ICD-10-CM | POA: Diagnosis not present

## 2016-03-06 DIAGNOSIS — R609 Edema, unspecified: Secondary | ICD-10-CM | POA: Diagnosis not present

## 2016-03-06 MED ORDER — ONDANSETRON 4 MG PO TBDP: 4.0000 mg | ORAL_TABLET | Freq: Three times a day (TID) | ORAL | 4 refills | Status: AC | PRN

## 2016-03-06 NOTE — Telephone Encounter (Signed)
Spoke with lindsey from [facility] and explain to her that I tried contacting Dr. Sherren Mocha and haven't been able to reach him. Ask if one of the hospice physicians can look over her med list and lindsey stated that the physician has never seen that pt and they typically like for the Attending physician to make the decisions. Stated to lindsey that if I can hear from Dr. Sherren Mocha tomorrow that I will call her immediately.

## 2016-03-06 NOTE — Telephone Encounter (Signed)
Pt has been nauseous, vomiting , diarrhea almost every night. 9 pm hour has diarrhea every night.  Hospice would like Dr Sherren Mocha  to review her medications and see if there is anything he can do, pt is pretty miserable  Pt takes her meds BID

## 2016-03-06 NOTE — Telephone Encounter (Signed)
Spoke with clinical staff at [facility] in regards to the pt not feeling well. Explain to staff that Dr. Sherren Mocha was not in the office and would not return until Monday. Ask if an hospice physician could review the pt medication list and do adjustments as he sees fit.

## 2016-03-06 NOTE — Telephone Encounter (Signed)
Spoke with Dr. Sherren Mocha and his recommendations were to stop taking the hydralazine and refill the zofran. Will call lindsey back and rely the message.

## 2016-03-11 ENCOUNTER — Telehealth: Payer: Self-pay | Admitting: Family Medicine

## 2016-03-11 DIAGNOSIS — I5030 Unspecified diastolic (congestive) heart failure: Secondary | ICD-10-CM | POA: Diagnosis not present

## 2016-03-11 DIAGNOSIS — R0600 Dyspnea, unspecified: Secondary | ICD-10-CM | POA: Diagnosis not present

## 2016-03-11 DIAGNOSIS — R630 Anorexia: Secondary | ICD-10-CM | POA: Diagnosis not present

## 2016-03-11 DIAGNOSIS — R634 Abnormal weight loss: Secondary | ICD-10-CM | POA: Diagnosis not present

## 2016-03-11 DIAGNOSIS — R609 Edema, unspecified: Secondary | ICD-10-CM | POA: Diagnosis not present

## 2016-03-11 DIAGNOSIS — N184 Chronic kidney disease, stage 4 (severe): Secondary | ICD-10-CM | POA: Diagnosis not present

## 2016-03-11 MED ORDER — LORAZEPAM 1 MG PO TABS: 1.0000 mg | ORAL_TABLET | Freq: Every day | ORAL | 5 refills | Status: DC

## 2016-03-11 NOTE — Telephone Encounter (Addendum)
Spoke with Mendel Ryder in regarding needed a sleep aid for pt. Told he that I would ask Dr. Sherren Mocha when he returned back to the office and give her a call back.

## 2016-03-11 NOTE — Telephone Encounter (Signed)
Spoke with Amanda Cunningham in regarding a sleep aid for Amanda Cunningham. Per Dr. Sherren Mocha pt is to take 1 mg ativan every day at bedtime for sleep. Call the rx into the pharmacy

## 2016-03-11 NOTE — Addendum Note (Signed)
Addended by: Milford Cage on: 03/11/2016 02:46 PM   Modules accepted: Orders

## 2016-03-11 NOTE — Telephone Encounter (Signed)
Ria Comment with [facility] called to state that the pt is not sleeping at night and she is wanting advice on a sleep aid for the pt.  Also the BP medicine that she was taken off of last week has made a huge difference.  She's had no more Nausea, vomiting or diarrhea.

## 2016-03-12 ENCOUNTER — Telehealth: Payer: Self-pay | Admitting: Family Medicine

## 2016-03-12 DIAGNOSIS — R609 Edema, unspecified: Secondary | ICD-10-CM | POA: Diagnosis not present

## 2016-03-12 DIAGNOSIS — R634 Abnormal weight loss: Secondary | ICD-10-CM | POA: Diagnosis not present

## 2016-03-12 DIAGNOSIS — R630 Anorexia: Secondary | ICD-10-CM | POA: Diagnosis not present

## 2016-03-12 DIAGNOSIS — I5030 Unspecified diastolic (congestive) heart failure: Secondary | ICD-10-CM | POA: Diagnosis not present

## 2016-03-12 DIAGNOSIS — N184 Chronic kidney disease, stage 4 (severe): Secondary | ICD-10-CM | POA: Diagnosis not present

## 2016-03-12 DIAGNOSIS — R0600 Dyspnea, unspecified: Secondary | ICD-10-CM | POA: Diagnosis not present

## 2016-03-12 NOTE — Telephone Encounter (Signed)
The pt is refusing to take ativan and would like to take tylenol pm to help her sleep. Please advise

## 2016-03-13 DIAGNOSIS — E039 Hypothyroidism, unspecified: Secondary | ICD-10-CM | POA: Diagnosis not present

## 2016-03-13 DIAGNOSIS — R630 Anorexia: Secondary | ICD-10-CM | POA: Diagnosis not present

## 2016-03-13 DIAGNOSIS — K219 Gastro-esophageal reflux disease without esophagitis: Secondary | ICD-10-CM | POA: Diagnosis not present

## 2016-03-13 DIAGNOSIS — E1159 Type 2 diabetes mellitus with other circulatory complications: Secondary | ICD-10-CM | POA: Diagnosis not present

## 2016-03-13 DIAGNOSIS — R634 Abnormal weight loss: Secondary | ICD-10-CM | POA: Diagnosis not present

## 2016-03-13 DIAGNOSIS — N184 Chronic kidney disease, stage 4 (severe): Secondary | ICD-10-CM | POA: Diagnosis not present

## 2016-03-13 DIAGNOSIS — R609 Edema, unspecified: Secondary | ICD-10-CM | POA: Diagnosis not present

## 2016-03-13 DIAGNOSIS — I5030 Unspecified diastolic (congestive) heart failure: Secondary | ICD-10-CM | POA: Diagnosis not present

## 2016-03-13 DIAGNOSIS — R0600 Dyspnea, unspecified: Secondary | ICD-10-CM | POA: Diagnosis not present

## 2016-03-13 DIAGNOSIS — D631 Anemia in chronic kidney disease: Secondary | ICD-10-CM | POA: Diagnosis not present

## 2016-03-13 DIAGNOSIS — M109 Gout, unspecified: Secondary | ICD-10-CM | POA: Diagnosis not present

## 2016-03-13 DIAGNOSIS — I87319 Chronic venous hypertension (idiopathic) with ulcer of unspecified lower extremity: Secondary | ICD-10-CM | POA: Diagnosis not present

## 2016-03-14 DIAGNOSIS — N184 Chronic kidney disease, stage 4 (severe): Secondary | ICD-10-CM | POA: Diagnosis not present

## 2016-03-14 DIAGNOSIS — I5030 Unspecified diastolic (congestive) heart failure: Secondary | ICD-10-CM | POA: Diagnosis not present

## 2016-03-14 DIAGNOSIS — R630 Anorexia: Secondary | ICD-10-CM | POA: Diagnosis not present

## 2016-03-14 DIAGNOSIS — R634 Abnormal weight loss: Secondary | ICD-10-CM | POA: Diagnosis not present

## 2016-03-14 DIAGNOSIS — R0600 Dyspnea, unspecified: Secondary | ICD-10-CM | POA: Diagnosis not present

## 2016-03-14 DIAGNOSIS — R609 Edema, unspecified: Secondary | ICD-10-CM | POA: Diagnosis not present

## 2016-03-15 DIAGNOSIS — I5030 Unspecified diastolic (congestive) heart failure: Secondary | ICD-10-CM | POA: Diagnosis not present

## 2016-03-15 DIAGNOSIS — R634 Abnormal weight loss: Secondary | ICD-10-CM | POA: Diagnosis not present

## 2016-03-15 DIAGNOSIS — N184 Chronic kidney disease, stage 4 (severe): Secondary | ICD-10-CM | POA: Diagnosis not present

## 2016-03-15 DIAGNOSIS — R0600 Dyspnea, unspecified: Secondary | ICD-10-CM | POA: Diagnosis not present

## 2016-03-15 DIAGNOSIS — R609 Edema, unspecified: Secondary | ICD-10-CM | POA: Diagnosis not present

## 2016-03-15 DIAGNOSIS — R630 Anorexia: Secondary | ICD-10-CM | POA: Diagnosis not present

## 2016-03-18 DIAGNOSIS — R630 Anorexia: Secondary | ICD-10-CM | POA: Diagnosis not present

## 2016-03-18 DIAGNOSIS — I5030 Unspecified diastolic (congestive) heart failure: Secondary | ICD-10-CM | POA: Diagnosis not present

## 2016-03-18 DIAGNOSIS — R634 Abnormal weight loss: Secondary | ICD-10-CM | POA: Diagnosis not present

## 2016-03-18 DIAGNOSIS — R609 Edema, unspecified: Secondary | ICD-10-CM | POA: Diagnosis not present

## 2016-03-18 DIAGNOSIS — N184 Chronic kidney disease, stage 4 (severe): Secondary | ICD-10-CM | POA: Diagnosis not present

## 2016-03-18 DIAGNOSIS — R0600 Dyspnea, unspecified: Secondary | ICD-10-CM | POA: Diagnosis not present

## 2016-03-19 ENCOUNTER — Other Ambulatory Visit: Payer: Self-pay | Admitting: Emergency Medicine

## 2016-03-19 DIAGNOSIS — N184 Chronic kidney disease, stage 4 (severe): Secondary | ICD-10-CM | POA: Diagnosis not present

## 2016-03-19 DIAGNOSIS — R609 Edema, unspecified: Secondary | ICD-10-CM | POA: Diagnosis not present

## 2016-03-19 DIAGNOSIS — I5030 Unspecified diastolic (congestive) heart failure: Secondary | ICD-10-CM | POA: Diagnosis not present

## 2016-03-19 DIAGNOSIS — R0600 Dyspnea, unspecified: Secondary | ICD-10-CM | POA: Diagnosis not present

## 2016-03-19 DIAGNOSIS — R630 Anorexia: Secondary | ICD-10-CM | POA: Diagnosis not present

## 2016-03-19 DIAGNOSIS — R634 Abnormal weight loss: Secondary | ICD-10-CM | POA: Diagnosis not present

## 2016-03-19 MED ORDER — FENTANYL 50 MCG/HR TD PT72: 50.0000 ug | MEDICATED_PATCH | TRANSDERMAL | 0 refills | Status: AC

## 2016-03-21 DIAGNOSIS — R630 Anorexia: Secondary | ICD-10-CM | POA: Diagnosis not present

## 2016-03-21 DIAGNOSIS — R609 Edema, unspecified: Secondary | ICD-10-CM | POA: Diagnosis not present

## 2016-03-21 DIAGNOSIS — R634 Abnormal weight loss: Secondary | ICD-10-CM | POA: Diagnosis not present

## 2016-03-21 DIAGNOSIS — N184 Chronic kidney disease, stage 4 (severe): Secondary | ICD-10-CM | POA: Diagnosis not present

## 2016-03-21 DIAGNOSIS — R0600 Dyspnea, unspecified: Secondary | ICD-10-CM | POA: Diagnosis not present

## 2016-03-21 DIAGNOSIS — I5030 Unspecified diastolic (congestive) heart failure: Secondary | ICD-10-CM | POA: Diagnosis not present

## 2016-03-24 ENCOUNTER — Telehealth: Payer: Self-pay | Admitting: Emergency Medicine

## 2016-03-24 NOTE — Telephone Encounter (Signed)
Dr. Sherren Mocha spoke with pt family member and pt daughter came and picked up a prescription for fentanyl on 03/19/2016

## 2016-03-25 DIAGNOSIS — R609 Edema, unspecified: Secondary | ICD-10-CM | POA: Diagnosis not present

## 2016-03-25 DIAGNOSIS — N184 Chronic kidney disease, stage 4 (severe): Secondary | ICD-10-CM | POA: Diagnosis not present

## 2016-03-25 DIAGNOSIS — R634 Abnormal weight loss: Secondary | ICD-10-CM | POA: Diagnosis not present

## 2016-03-25 DIAGNOSIS — R630 Anorexia: Secondary | ICD-10-CM | POA: Diagnosis not present

## 2016-03-25 DIAGNOSIS — R0600 Dyspnea, unspecified: Secondary | ICD-10-CM | POA: Diagnosis not present

## 2016-03-25 DIAGNOSIS — I5030 Unspecified diastolic (congestive) heart failure: Secondary | ICD-10-CM | POA: Diagnosis not present

## 2016-03-26 DIAGNOSIS — I5030 Unspecified diastolic (congestive) heart failure: Secondary | ICD-10-CM | POA: Diagnosis not present

## 2016-03-26 DIAGNOSIS — R634 Abnormal weight loss: Secondary | ICD-10-CM | POA: Diagnosis not present

## 2016-03-26 DIAGNOSIS — R630 Anorexia: Secondary | ICD-10-CM | POA: Diagnosis not present

## 2016-03-26 DIAGNOSIS — R609 Edema, unspecified: Secondary | ICD-10-CM | POA: Diagnosis not present

## 2016-03-26 DIAGNOSIS — N184 Chronic kidney disease, stage 4 (severe): Secondary | ICD-10-CM | POA: Diagnosis not present

## 2016-03-26 DIAGNOSIS — R0600 Dyspnea, unspecified: Secondary | ICD-10-CM | POA: Diagnosis not present

## 2016-03-27 DIAGNOSIS — R0600 Dyspnea, unspecified: Secondary | ICD-10-CM | POA: Diagnosis not present

## 2016-03-27 DIAGNOSIS — R609 Edema, unspecified: Secondary | ICD-10-CM | POA: Diagnosis not present

## 2016-03-27 DIAGNOSIS — R630 Anorexia: Secondary | ICD-10-CM | POA: Diagnosis not present

## 2016-03-27 DIAGNOSIS — N184 Chronic kidney disease, stage 4 (severe): Secondary | ICD-10-CM | POA: Diagnosis not present

## 2016-03-27 DIAGNOSIS — R634 Abnormal weight loss: Secondary | ICD-10-CM | POA: Diagnosis not present

## 2016-03-27 DIAGNOSIS — I5030 Unspecified diastolic (congestive) heart failure: Secondary | ICD-10-CM | POA: Diagnosis not present

## 2016-03-29 DIAGNOSIS — R634 Abnormal weight loss: Secondary | ICD-10-CM | POA: Diagnosis not present

## 2016-03-29 DIAGNOSIS — I5030 Unspecified diastolic (congestive) heart failure: Secondary | ICD-10-CM | POA: Diagnosis not present

## 2016-03-29 DIAGNOSIS — R630 Anorexia: Secondary | ICD-10-CM | POA: Diagnosis not present

## 2016-03-29 DIAGNOSIS — R0600 Dyspnea, unspecified: Secondary | ICD-10-CM | POA: Diagnosis not present

## 2016-03-29 DIAGNOSIS — R609 Edema, unspecified: Secondary | ICD-10-CM | POA: Diagnosis not present

## 2016-03-29 DIAGNOSIS — N184 Chronic kidney disease, stage 4 (severe): Secondary | ICD-10-CM | POA: Diagnosis not present

## 2016-04-02 ENCOUNTER — Telehealth: Payer: Self-pay | Admitting: Family Medicine

## 2016-04-02 DIAGNOSIS — R0600 Dyspnea, unspecified: Secondary | ICD-10-CM | POA: Diagnosis not present

## 2016-04-02 DIAGNOSIS — I5030 Unspecified diastolic (congestive) heart failure: Secondary | ICD-10-CM | POA: Diagnosis not present

## 2016-04-02 DIAGNOSIS — R609 Edema, unspecified: Secondary | ICD-10-CM | POA: Diagnosis not present

## 2016-04-02 DIAGNOSIS — R630 Anorexia: Secondary | ICD-10-CM | POA: Diagnosis not present

## 2016-04-02 DIAGNOSIS — R634 Abnormal weight loss: Secondary | ICD-10-CM | POA: Diagnosis not present

## 2016-04-02 DIAGNOSIS — N184 Chronic kidney disease, stage 4 (severe): Secondary | ICD-10-CM | POA: Diagnosis not present

## 2016-04-02 NOTE — Telephone Encounter (Signed)
° °  Joy with Hospice call to say the family told them that Dr Sherren Mocha had dc some of the pt medicine. Joy call today to ask for a call back letting them know what medicine was d/c   [phone]

## 2016-04-03 DIAGNOSIS — R609 Edema, unspecified: Secondary | ICD-10-CM | POA: Diagnosis not present

## 2016-04-03 DIAGNOSIS — R0600 Dyspnea, unspecified: Secondary | ICD-10-CM | POA: Diagnosis not present

## 2016-04-03 DIAGNOSIS — I5030 Unspecified diastolic (congestive) heart failure: Secondary | ICD-10-CM | POA: Diagnosis not present

## 2016-04-03 DIAGNOSIS — R630 Anorexia: Secondary | ICD-10-CM | POA: Diagnosis not present

## 2016-04-03 DIAGNOSIS — N184 Chronic kidney disease, stage 4 (severe): Secondary | ICD-10-CM | POA: Diagnosis not present

## 2016-04-03 DIAGNOSIS — R634 Abnormal weight loss: Secondary | ICD-10-CM | POA: Diagnosis not present

## 2016-04-03 NOTE — Telephone Encounter (Signed)
Spoke with Joy from hospice and per Dr. Sherren Mocha all non essential medications are to be d/c and continue the Fentanyl patch

## 2016-04-04 DIAGNOSIS — R634 Abnormal weight loss: Secondary | ICD-10-CM | POA: Diagnosis not present

## 2016-04-04 DIAGNOSIS — R630 Anorexia: Secondary | ICD-10-CM | POA: Diagnosis not present

## 2016-04-04 DIAGNOSIS — N184 Chronic kidney disease, stage 4 (severe): Secondary | ICD-10-CM | POA: Diagnosis not present

## 2016-04-04 DIAGNOSIS — R0600 Dyspnea, unspecified: Secondary | ICD-10-CM | POA: Diagnosis not present

## 2016-04-04 DIAGNOSIS — I5030 Unspecified diastolic (congestive) heart failure: Secondary | ICD-10-CM | POA: Diagnosis not present

## 2016-04-04 DIAGNOSIS — R609 Edema, unspecified: Secondary | ICD-10-CM | POA: Diagnosis not present

## 2016-04-06 DIAGNOSIS — I5030 Unspecified diastolic (congestive) heart failure: Secondary | ICD-10-CM | POA: Diagnosis not present

## 2016-04-06 DIAGNOSIS — N184 Chronic kidney disease, stage 4 (severe): Secondary | ICD-10-CM | POA: Diagnosis not present

## 2016-04-06 DIAGNOSIS — R634 Abnormal weight loss: Secondary | ICD-10-CM | POA: Diagnosis not present

## 2016-04-06 DIAGNOSIS — R0600 Dyspnea, unspecified: Secondary | ICD-10-CM | POA: Diagnosis not present

## 2016-04-06 DIAGNOSIS — R609 Edema, unspecified: Secondary | ICD-10-CM | POA: Diagnosis not present

## 2016-04-06 DIAGNOSIS — R630 Anorexia: Secondary | ICD-10-CM | POA: Diagnosis not present

## 2016-04-07 ENCOUNTER — Telehealth: Payer: Self-pay

## 2016-04-07 ENCOUNTER — Other Ambulatory Visit: Payer: Self-pay

## 2016-04-07 DIAGNOSIS — R0600 Dyspnea, unspecified: Secondary | ICD-10-CM | POA: Diagnosis not present

## 2016-04-07 DIAGNOSIS — R609 Edema, unspecified: Secondary | ICD-10-CM | POA: Diagnosis not present

## 2016-04-07 DIAGNOSIS — I5030 Unspecified diastolic (congestive) heart failure: Secondary | ICD-10-CM | POA: Diagnosis not present

## 2016-04-07 DIAGNOSIS — N184 Chronic kidney disease, stage 4 (severe): Secondary | ICD-10-CM | POA: Diagnosis not present

## 2016-04-07 DIAGNOSIS — R630 Anorexia: Secondary | ICD-10-CM | POA: Diagnosis not present

## 2016-04-07 DIAGNOSIS — R634 Abnormal weight loss: Secondary | ICD-10-CM | POA: Diagnosis not present

## 2016-04-07 NOTE — Telephone Encounter (Signed)
Spoke with [employer] @ Hospice. She states that it took so long for the [facility] nurse to call back that they got orders from the Hospice MD. Pt has been placed on Morphine 10-20mg  po q4h prn pain and Lorazepam 1mg  po q4h prn anxiety/agitation. These medications have been added to pt chart. Leone Payor that she can call office directly for any needs M-F 8-5 and if she needs to speak with [employer] again to insist that the call is urgent and needs immediate attention. She voiced understanding. Nothing further needed at this time.    [facility] Night - Client Client Site [facility] - Night Physician Christie Nottingham - MD Contact Type Call Call [place] Page Now Who Is [place] / [place] Name [place] Name [place] Number [phone] Patient Name Amanda Cunningham Patient DOB [birth year] Reason for Call Request to speak to Physician Initial Comment [place], calling to speak to MD about patient who is severe pain. Crissie Sickles - MD 04/06/2016 7:33:56 PM Spoke with On Call - General Message Result

## 2016-04-08 DIAGNOSIS — R630 Anorexia: Secondary | ICD-10-CM | POA: Diagnosis not present

## 2016-04-08 DIAGNOSIS — N184 Chronic kidney disease, stage 4 (severe): Secondary | ICD-10-CM | POA: Diagnosis not present

## 2016-04-08 DIAGNOSIS — I5030 Unspecified diastolic (congestive) heart failure: Secondary | ICD-10-CM | POA: Diagnosis not present

## 2016-04-08 DIAGNOSIS — R609 Edema, unspecified: Secondary | ICD-10-CM | POA: Diagnosis not present

## 2016-04-08 DIAGNOSIS — R0600 Dyspnea, unspecified: Secondary | ICD-10-CM | POA: Diagnosis not present

## 2016-04-08 DIAGNOSIS — R634 Abnormal weight loss: Secondary | ICD-10-CM | POA: Diagnosis not present

## 2016-04-09 ENCOUNTER — Telehealth: Payer: Self-pay | Admitting: Family Medicine

## 2016-04-09 DIAGNOSIS — R630 Anorexia: Secondary | ICD-10-CM | POA: Diagnosis not present

## 2016-04-09 DIAGNOSIS — R634 Abnormal weight loss: Secondary | ICD-10-CM | POA: Diagnosis not present

## 2016-04-09 DIAGNOSIS — N184 Chronic kidney disease, stage 4 (severe): Secondary | ICD-10-CM | POA: Diagnosis not present

## 2016-04-09 DIAGNOSIS — I5030 Unspecified diastolic (congestive) heart failure: Secondary | ICD-10-CM | POA: Diagnosis not present

## 2016-04-09 DIAGNOSIS — R609 Edema, unspecified: Secondary | ICD-10-CM | POA: Diagnosis not present

## 2016-04-09 DIAGNOSIS — R0600 Dyspnea, unspecified: Secondary | ICD-10-CM | POA: Diagnosis not present

## 2016-04-09 NOTE — Telephone Encounter (Signed)
° ° °  Keisha with Hospice call to say patient passed away today at 1:30 pm in her home

## 2016-04-10 DEATH — deceased

## 2016-04-11 NOTE — Telephone Encounter (Signed)
Death certificate placed on Dr. Honor Junes desk.

## 2016-04-17 ENCOUNTER — Telehealth: Payer: Self-pay | Admitting: Family Medicine

## 2016-04-17 NOTE — Telephone Encounter (Signed)
Noted.  Have not received any paper work prior to today.  Will wait on paper work to arrive.

## 2016-04-17 NOTE — Telephone Encounter (Signed)
[facility] will be sending over pw for the 3rd time that needs to be completed for pt that received service from 02/11/16-03/12/16.

## 2016-04-23 NOTE — Telephone Encounter (Signed)
Papers received.  Will have Dr. Sherren Mocha sign when he comes on Monday

## 2016-04-28 NOTE — Telephone Encounter (Signed)
Papers faxed.  Received confirmation that the fax was successful.

## 2016-06-03 ENCOUNTER — Ambulatory Visit: Payer: Medicare Other | Admitting: Hematology and Oncology
